# Patient Record
Sex: Female | Born: 1980 | Race: Black or African American | Hispanic: No | Marital: Single | Smoking: Never smoker
Health system: Southern US, Community
[De-identification: ages and names within clinical notes are randomized; demographics above are authoritative.]

## PROBLEM LIST (undated history)

## (undated) ENCOUNTER — Ambulatory Visit: Admission: EM

## (undated) ENCOUNTER — Ambulatory Visit (HOSPITAL_COMMUNITY): Source: Home / Self Care

## (undated) DIAGNOSIS — I82409 Acute embolism and thrombosis of unspecified deep veins of unspecified lower extremity: Secondary | ICD-10-CM

## (undated) DIAGNOSIS — I2699 Other pulmonary embolism without acute cor pulmonale: Secondary | ICD-10-CM

## (undated) DIAGNOSIS — D259 Leiomyoma of uterus, unspecified: Secondary | ICD-10-CM

## (undated) DIAGNOSIS — S2239XA Fracture of one rib, unspecified side, initial encounter for closed fracture: Secondary | ICD-10-CM

## (undated) HISTORY — DX: Acute embolism and thrombosis of unspecified deep veins of unspecified lower extremity: I82.409

## (undated) HISTORY — DX: Leiomyoma of uterus, unspecified: D25.9

---

## 2002-10-24 ENCOUNTER — Emergency Department (HOSPITAL_COMMUNITY): Admission: EM | Admit: 2002-10-24 | Discharge: 2002-10-25 | Payer: Self-pay | Admitting: Emergency Medicine

## 2002-10-25 ENCOUNTER — Encounter: Payer: Self-pay | Admitting: Emergency Medicine

## 2003-03-28 ENCOUNTER — Emergency Department (HOSPITAL_COMMUNITY): Admission: AD | Admit: 2003-03-28 | Discharge: 2003-03-28 | Payer: Self-pay | Admitting: Family Medicine

## 2004-01-15 ENCOUNTER — Emergency Department (HOSPITAL_COMMUNITY): Admission: EM | Admit: 2004-01-15 | Discharge: 2004-01-15 | Payer: Self-pay | Admitting: Family Medicine

## 2004-06-19 ENCOUNTER — Emergency Department (HOSPITAL_COMMUNITY): Admission: EM | Admit: 2004-06-19 | Discharge: 2004-06-19 | Payer: Self-pay | Admitting: Family Medicine

## 2006-06-01 ENCOUNTER — Emergency Department (HOSPITAL_COMMUNITY): Admission: EM | Admit: 2006-06-01 | Discharge: 2006-06-01 | Payer: Self-pay | Admitting: Family Medicine

## 2008-09-21 ENCOUNTER — Emergency Department (HOSPITAL_COMMUNITY): Admission: EM | Admit: 2008-09-21 | Discharge: 2008-09-21 | Payer: Self-pay | Admitting: Family Medicine

## 2009-04-12 ENCOUNTER — Emergency Department (HOSPITAL_COMMUNITY): Admission: EM | Admit: 2009-04-12 | Discharge: 2009-04-12 | Payer: Self-pay | Admitting: Family Medicine

## 2010-01-31 DIAGNOSIS — Z23 Encounter for immunization: Secondary | ICD-10-CM | POA: Insufficient documentation

## 2010-01-31 DIAGNOSIS — R7301 Impaired fasting glucose: Secondary | ICD-10-CM | POA: Insufficient documentation

## 2010-05-10 ENCOUNTER — Encounter
Admission: RE | Admit: 2010-05-10 | Discharge: 2010-05-10 | Payer: Self-pay | Source: Home / Self Care | Attending: Infectious Diseases | Admitting: Infectious Diseases

## 2010-07-22 ENCOUNTER — Inpatient Hospital Stay (INDEPENDENT_AMBULATORY_CARE_PROVIDER_SITE_OTHER)
Admission: RE | Admit: 2010-07-22 | Discharge: 2010-07-22 | Disposition: A | Discharge status: Home / Self Care | Payer: BC Managed Care – PPO | Source: Ambulatory Visit | Attending: Family Medicine | Admitting: Family Medicine

## 2010-07-22 DIAGNOSIS — K5289 Other specified noninfective gastroenteritis and colitis: Secondary | ICD-10-CM

## 2010-07-22 LAB — POCT URINALYSIS DIP (DEVICE)
Bilirubin Urine: NEGATIVE
Glucose, UA: NEGATIVE mg/dL
Hgb urine dipstick: NEGATIVE
Ketones, ur: NEGATIVE mg/dL
Nitrite: NEGATIVE
Protein, ur: NEGATIVE mg/dL
Specific Gravity, Urine: 1.025 (ref 1.005–1.030)
Urobilinogen, UA: 0.2 mg/dL (ref 0.0–1.0)
pH: 6.5 (ref 5.0–8.0)

## 2011-03-11 ENCOUNTER — Emergency Department (HOSPITAL_COMMUNITY)
Admission: EM | Admit: 2011-03-11 | Discharge: 2011-03-11 | Disposition: A | Discharge status: Home / Self Care | Payer: BC Managed Care – PPO | Attending: Emergency Medicine | Admitting: Emergency Medicine

## 2011-03-11 ENCOUNTER — Emergency Department (HOSPITAL_COMMUNITY): Payer: BC Managed Care – PPO

## 2011-03-11 ENCOUNTER — Encounter: Payer: Self-pay | Admitting: *Deleted

## 2011-03-11 DIAGNOSIS — K219 Gastro-esophageal reflux disease without esophagitis: Secondary | ICD-10-CM | POA: Insufficient documentation

## 2011-03-11 DIAGNOSIS — R0789 Other chest pain: Secondary | ICD-10-CM | POA: Insufficient documentation

## 2011-03-11 DIAGNOSIS — Z79899 Other long term (current) drug therapy: Secondary | ICD-10-CM | POA: Insufficient documentation

## 2011-03-11 LAB — DIFFERENTIAL
Basophils Absolute: 0 10*3/uL (ref 0.0–0.1)
Basophils Relative: 0 % (ref 0–1)
Eosinophils Absolute: 0.1 10*3/uL (ref 0.0–0.7)
Eosinophils Relative: 1 % (ref 0–5)
Lymphocytes Relative: 44 % (ref 12–46)
Lymphs Abs: 3 10*3/uL (ref 0.7–4.0)
Monocytes Absolute: 0.3 10*3/uL (ref 0.1–1.0)
Monocytes Relative: 4 % (ref 3–12)
Neutro Abs: 3.5 10*3/uL (ref 1.7–7.7)
Neutrophils Relative %: 50 % (ref 43–77)

## 2011-03-11 LAB — POCT I-STAT, CHEM 8
BUN: 10 mg/dL (ref 6–23)
Calcium, Ion: 1.35 mmol/L — ABNORMAL HIGH (ref 1.12–1.32)
Chloride: 102 mEq/L (ref 96–112)
Creatinine, Ser: 0.9 mg/dL (ref 0.50–1.10)
Glucose, Bld: 85 mg/dL (ref 70–99)
HCT: 38 % (ref 36.0–46.0)
Hemoglobin: 12.9 g/dL (ref 12.0–15.0)
Potassium: 4.1 mEq/L (ref 3.5–5.1)
Sodium: 140 mEq/L (ref 135–145)
TCO2: 27 mmol/L (ref 0–100)

## 2011-03-11 LAB — CARDIAC PANEL(CRET KIN+CKTOT+MB+TROPI)
CK, MB: 3.2 ng/mL (ref 0.3–4.0)
Relative Index: 1.8 (ref 0.0–2.5)
Total CK: 181 U/L — ABNORMAL HIGH (ref 7–177)
Troponin I: <0.3 ng/mL (ref <0.30)

## 2011-03-11 LAB — CBC
HCT: 35.2 % — ABNORMAL LOW (ref 36.0–46.0)
Hemoglobin: 12 g/dL (ref 12.0–15.0)
MCH: 28.8 pg (ref 26.0–34.0)
MCHC: 34.1 g/dL (ref 30.0–36.0)
MCV: 84.6 fL (ref 78.0–100.0)
Platelets: 308 10*3/uL (ref 150–400)
RBC: 4.16 MIL/uL (ref 3.87–5.11)
RDW: 13.9 % (ref 11.5–15.5)
WBC: 6.9 10*3/uL (ref 4.0–10.5)

## 2011-03-11 MED ORDER — PANTOPRAZOLE SODIUM 20 MG PO TBEC: 20.0000 mg | DELAYED_RELEASE_TABLET | Freq: Every day | ORAL | Status: DC

## 2011-03-11 MED ORDER — GI COCKTAIL ~~LOC~~
30.0000 mL | Freq: Once | ORAL | Status: AC
  Administered 2011-03-11: 30 mL via ORAL
  Filled 2011-03-11: qty 30

## 2011-03-11 NOTE — ED Provider Notes (Signed)
History     CSN: [id number] Arrival date & time: 03/11/2011  4:36 PM   First MD Initiated Contact with Patient 03/11/11 2141      Chief Complaint  Patient presents with  . Chest Pain    HPI  History provided by the patient. Patient presents with complaints of substernal chest pressure. Patient reports having episodic symptoms since last Wednesday, 6 days ago. Symptoms are described as a heaviness in the center of her chest that lasts about 30 minutes at a time or until she took over-the-counter medications. Patient will take ibuprofen and Tums with relief of symptoms. Today however ibuprofen and TUMS have not helped with symptoms and they have seemed more persistent during the day. Patient denies any aggravating factors, symptoms are not worse with eating, exertion or activity. Patient denies any associated nausea, vomiting, diaphoresis, shortness of breath, hemoptysis, cough, abdominal pain. Patient works in [employer], commissioning but denies any physical exertion, activity or trauma to the chest to cause injury or symptoms. Patient denies any significant past medical history. Patient has no history of hypertension, diabetes, high cholesterol. Patient is a nonsmoker.   History reviewed. No pertinent past medical history.  History reviewed. No pertinent past surgical history.  History reviewed. No pertinent family history.  History  Substance Use Topics  . Smoking status: Never Smoker   . Smokeless tobacco: Not on file  . Alcohol Use: No    OB History    Grav Para Term Preterm Abortions TAB SAB Ect Mult Living                  Review of Systems  Constitutional: Negative for fever and chills.  Respiratory: Negative for cough and shortness of breath.   Cardiovascular: Positive for chest pain. Negative for palpitations.  Gastrointestinal: Negative for nausea, vomiting, abdominal pain, diarrhea and constipation.  Genitourinary: Negative for dysuria, frequency and hematuria.  Neurological:  Negative for dizziness and light-headedness.  All other systems reviewed and are negative.    Allergies  Review of patient's allergies indicates no known allergies.  Home Medications   Current Outpatient Rx  Name Route Sig Dispense Refill  . CALCIUM CARBONATE ANTACID 500 MG PO CHEW Oral Chew 2 tablets by mouth daily.      . DESONIDE 0.05 % EX CREA Topical Apply 1 application topically 2 (two) times daily.      . IBUPROFEN 200 MG PO TABS Oral Take 600-800 mg by mouth every 6 (six) hours as needed. For pain     . ISONIAZID 100 MG PO TABS Oral Take 100 mg by mouth daily.        BP 129/79  Pulse 67  Temp(Src) 97.7 F (36.5 C) (Oral)  Resp 16  Ht 5\' 5"  (1.651 m)  Wt 247 lb (112.038 kg)  BMI 41.10 kg/m2  SpO2 100%  LMP 03/11/2011  Physical Exam  Nursing note and vitals reviewed. Constitutional: She is oriented to person, place, and time. She appears well-developed and well-nourished. No distress.  HENT:  Head: Normocephalic.  Eyes: Conjunctivae and EOM are normal. Pupils are equal, round, and reactive to light.  Cardiovascular: Normal rate and normal heart sounds.   Pulmonary/Chest: Effort normal and breath sounds normal. No respiratory distress. She has no wheezes. She has no rales. She exhibits no tenderness.  Abdominal: Soft. Bowel sounds are normal. She exhibits no distension and no mass. There is no tenderness. There is no rebound and no guarding.  Musculoskeletal: She exhibits no edema and no tenderness.  Neurological: She is alert and oriented to person, place, and time.  Skin: Skin is warm and dry.  Psychiatric: She has a normal mood and affect. Her behavior is normal.    ED Course  Procedures (including critical care time)  Labs Reviewed  CBC - Abnormal; Notable for the following:    HCT 35.2 (*)    All other components within normal limits  CARDIAC PANEL(CRET KIN+CKTOT+MB+TROPI) - Abnormal; Notable for the following:    Total CK 181 (*)    All other components  within normal limits  POCT I-STAT, CHEM 8 - Abnormal; Notable for the following:    Calcium, Ion 1.35 (*)    All other components within normal limits  DIFFERENTIAL  I-STAT, CHEM 8   Results for orders placed during the hospital encounter of 03/11/11  CBC      Component Value Range   WBC 6.9  4.0 - 10.5 (K/uL)   RBC 4.16  3.87 - 5.11 (MIL/uL)   Hemoglobin 12.0  12.0 - 15.0 (g/dL)   HCT 16.1 (*) 09.6 - 46.0 (%)   MCV 84.6  78.0 - 100.0 (fL)   MCH 28.8  26.0 - 34.0 (pg)   MCHC 34.1  30.0 - 36.0 (g/dL)   RDW 04.5  40.9 - 81.1 (%)   Platelets 308  150 - 400 (K/uL)  DIFFERENTIAL      Component Value Range   Neutrophils Relative 50  43 - 77 (%)   Neutro Abs 3.5  1.7 - 7.7 (K/uL)   Lymphocytes Relative 44  12 - 46 (%)   Lymphs Abs 3.0  0.7 - 4.0 (K/uL)   Monocytes Relative 4  3 - 12 (%)   Monocytes Absolute 0.3  0.1 - 1.0 (K/uL)   Eosinophils Relative 1  0 - 5 (%)   Eosinophils Absolute 0.1  0.0 - 0.7 (K/uL)   Basophils Relative 0  0 - 1 (%)   Basophils Absolute 0.0  0.0 - 0.1 (K/uL)  CARDIAC PANEL(CRET KIN+CKTOT+MB+TROPI)      Component Value Range   Total CK 181 (*) 7 - 177 (U/L)   CK, MB 3.2  0.3 - 4.0 (ng/mL)   Troponin I <0.30  <0.30 (ng/mL)   Relative Index 1.8  0.0 - 2.5   POCT I-STAT, CHEM 8      Component Value Range   Sodium 140  135 - 145 (mEq/L)   Potassium 4.1  3.5 - 5.1 (mEq/L)   Chloride 102  96 - 112 (mEq/L)   BUN 10  6 - 23 (mg/dL)   Creatinine, Ser 9.14  0.50 - 1.10 (mg/dL)   Glucose, Bld 85  70 - 99 (mg/dL)   Calcium, Ion 7.82 (*) 1.12 - 1.32 (mmol/L)   TCO2 27  0 - 100 (mmol/L)   Hemoglobin 12.9  12.0 - 15.0 (g/dL)   HCT 95.6  21.3 - 08.6 (%)     Dg Chest 2 View  03/11/2011  *RADIOLOGY REPORT*  Clinical Data: Chest pain, burning feeling  CHEST - 2 VIEW  Comparison: 05/10/2010  Findings: Normal heart size, mediastinal contours, and pulmonary vascularity. Minimal chronic peribronchial thickening. No pulmonary infiltrate, pleural effusion or  pneumothorax. Bones unremarkable.  IMPRESSION: No acute abnormalities.  Original Report Authenticated By: Lollie Marrow, M.D.     1. Chest discomfort   2. GERD (gastroesophageal reflux disease)       MDM  10:00 PM patient seen and evaluated. Patient in no acute distress.  Patient with normal  lab tests, chest x-ray, ECG. Patient with no significant risk factors for ACS. Patient is PERC negative. At this time feel patient is able to return home and followup with primary care provider.   Date: 03/11/2011 4:30 PM  Rate: 66  Rhythm: normal sinus rhythm  QRS Axis: normal  Intervals: normal  ST/T Wave abnormalities: normal  Conduction Disutrbances:none  Narrative Interpretation:   Old EKG Reviewed: none available       Angus Seller, [place] 03/11/11 2216

## 2011-03-11 NOTE — ED Notes (Signed)
Pt c/o epigastric pain with nausea for one week.  There is no pattern to the pain.  No distress skin warm and dry .  She has noit eaten today

## 2011-03-11 NOTE — ED Provider Notes (Signed)
Medical screening examination/treatment/procedure(s) were performed by non-physician practitioner and as supervising physician I was immediately available for consultation/collaboration.   Raynor Calcaterra, MD 03/11/11 2307 

## 2011-03-11 NOTE — Discharge Instructions (Signed)
You were evaluated today for chest discomfort. At this time your lab tests, EKG, and chest x-ray have not shown any signs for concerning or emergent cause of your symptoms. Your providers today feel that your symptoms are possibly caused from indigestion. You have been given a prescription for an antacid medication to take once a day to see if your symptoms improve. Please call your primary care provider this week to schedule a followup appointment and to be sure your symptoms are improving. He develop worsening symptoms, new symptoms such as shortness of breath, cough, fever, chills, persistent nausea vomiting please return to the emergency room immediately.  Gastroesophageal Reflux Disease, Adult Gastroesophageal reflux disease (GERD) happens when acid from your stomach flows up into the esophagus. When acid comes in contact with the esophagus, the acid causes soreness (inflammation) in the esophagus. Over time, GERD may create small holes (ulcers) in the lining of the esophagus. CAUSES   Increased body weight. This puts pressure on the stomach, making acid rise from the stomach into the esophagus.   Smoking. This increases acid production in the stomach.   Drinking alcohol. This causes decreased pressure in the lower esophageal sphincter (valve or ring of muscle between the esophagus and stomach), allowing acid from the stomach into the esophagus.   Late evening meals and a full stomach. This increases pressure and acid production in the stomach.   A malformed lower esophageal sphincter.  Sometimes, no cause is found. SYMPTOMS   Burning pain in the lower part of the mid-chest behind the breastbone and in the mid-stomach area. This may occur twice a week or more often.   Trouble swallowing.   Sore throat.   Dry cough.   Asthma-like symptoms including chest tightness, shortness of breath, or wheezing.  DIAGNOSIS  Your caregiver may be able to diagnose GERD based on your symptoms. In some  cases, X-rays and other tests may be done to check for complications or to check the condition of your stomach and esophagus. TREATMENT  Your caregiver may recommend over-the-counter or prescription medicines to help decrease acid production. Ask your caregiver before starting or adding any new medicines.  HOME CARE INSTRUCTIONS   Change the factors that you can control. Ask your caregiver for guidance concerning weight loss, quitting smoking, and alcohol consumption.   Avoid foods and drinks that make your symptoms worse, such as:   Caffeine or alcoholic drinks.   Chocolate.   Peppermint or mint flavorings.   Garlic and onions.   Spicy foods.   Citrus fruits, such as oranges, lemons, or limes.   Tomato-based foods such as sauce, chili, salsa, and pizza.   Fried and fatty foods.   Avoid lying down for the 3 hours prior to your bedtime or prior to taking a nap.   Eat small, frequent meals instead of large meals.   Wear loose-fitting clothing. Do not wear anything tight around your waist that causes pressure on your stomach.   Raise the head of your bed 6 to 8 inches with wood blocks to help you sleep. Extra pillows will not help.   Only take over-the-counter or prescription medicines for pain, discomfort, or fever as directed by your caregiver.   Do not take aspirin, ibuprofen, or other nonsteroidal anti-inflammatory drugs (NSAIDs).  SEEK IMMEDIATE MEDICAL CARE IF:   You have pain in your arms, neck, jaw, teeth, or back.   Your pain increases or changes in intensity or duration.   You develop nausea, vomiting, or sweating (  diaphoresis).   You develop shortness of breath, or you faint.   Your vomit is green, yellow, black, or looks like coffee grounds or blood.   Your stool is red, bloody, or black.  These symptoms could be signs of other problems, such as heart disease, gastric bleeding, or esophageal bleeding. MAKE SURE YOU:   Understand these instructions.    Will watch your condition.   Will get help right away if you are not doing well or get worse.  Document Released: 01/08/2005 Document Revised: 12/11/2010 Document Reviewed: 10/18/2010 [facility] Patient Information 2012 [place], [place].   Chest Pain (Nonspecific) It is often hard to give a specific diagnosis for the cause of chest pain. There is always a chance that your pain could be related to something serious, such as a heart attack or a blood clot in the lungs. You need to follow up with your caregiver for further evaluation. CAUSES   Heartburn.   Pneumonia or bronchitis.   Anxiety and stress.   Inflammation around your heart (pericarditis) or lung (pleuritis or pleurisy).   A blood clot in the lung.   A collapsed lung (pneumothorax). It can develop suddenly on its own (spontaneous pneumothorax) or from injury (trauma) to the chest.  The chest wall is composed of bones, muscles, and cartilage. Any of these can be the source of the pain.  The bones can be bruised by injury.   The muscles or cartilage can be strained by coughing or overwork.   The cartilage can be affected by inflammation and become sore (costochondritis).  DIAGNOSIS  Lab tests or other studies, such as X-rays, an EKG, stress testing, or cardiac imaging, may be needed to find the cause of your pain.  TREATMENT   Treatment depends on what may be causing your chest pain. Treatment may include:   Acid blockers for heartburn.   Anti-inflammatory medicine.   Pain medicine for inflammatory conditions.   Antibiotics if an infection is present.   You may be advised to change lifestyle habits. This includes stopping smoking and avoiding caffeine and chocolate.   You may be advised to keep your head raised (elevated) when sleeping. This reduces the chance of acid going backward from your stomach into your esophagus.   Most of the time, nonspecific chest pain will improve within 2 to 3 days with rest and mild  pain medicine.  HOME CARE INSTRUCTIONS   If antibiotics were prescribed, take the full amount even if you start to feel better.   For the next few days, avoid physical activities that bring on chest pain. Continue physical activities as directed.   Do not smoke cigarettes or drink alcohol until your symptoms are gone.   Only take over-the-counter or prescription medicine for pain, discomfort, or fever as directed by your caregiver.   Follow your caregiver's suggestions for further testing if your chest pain does not go away.   Keep any follow-up appointments you made. If you do not go to an appointment, you could develop lasting (chronic) problems with pain. If there is any problem keeping an appointment, you must call to reschedule.  SEEK MEDICAL CARE IF:   You think you are having problems from the medicine you are taking. Read your medicine instructions carefully.   Your chest pain does not go away, even after treatment.   You develop a rash with blisters on your chest.  SEEK IMMEDIATE MEDICAL CARE IF:   You have increased chest pain or pain that spreads to  your arm, neck, jaw, back, or belly (abdomen).   You develop shortness of breath, an increasing cough, or you are coughing up blood.   You have severe back or abdominal pain, feel sick to your stomach (nauseous) or throw up (vomit).   You develop severe weakness, fainting, or chills.   You have an oral temperature above 102 F (38.9 C), not controlled by medicine.  THIS IS AN EMERGENCY. Do not wait to see if the pain will go away. Get medical help at once. Call your local emergency services (911 in [place] Do not drive yourself to the hospital. MAKE SURE YOU:   Understand these instructions.   Will watch your condition.   Will get help right away if you are not doing well or get worse.  Document Released: 01/08/2005 Document Revised: 12/11/2010 Document Reviewed: 11/04/2007 [facility] Patient Information 2012 [place],  [place].

## 2012-04-03 ENCOUNTER — Emergency Department (HOSPITAL_COMMUNITY)
Admission: EM | Admit: 2012-04-03 | Discharge: 2012-04-03 | Disposition: A | Discharge status: Home / Self Care | Payer: BC Managed Care – PPO | Source: Home / Self Care | Attending: Emergency Medicine | Admitting: Emergency Medicine

## 2012-04-03 ENCOUNTER — Encounter (HOSPITAL_COMMUNITY): Payer: Self-pay | Admitting: *Deleted

## 2012-04-03 DIAGNOSIS — J069 Acute upper respiratory infection, unspecified: Secondary | ICD-10-CM

## 2012-04-03 MED ORDER — BENZONATATE 200 MG PO CAPS: 200.0000 mg | ORAL_CAPSULE | Freq: Three times a day (TID) | ORAL | Status: DC | PRN

## 2012-04-03 MED ORDER — FEXOFENADINE-PSEUDOEPHED ER 60-120 MG PO TB12: 1.0000 | ORAL_TABLET | Freq: Two times a day (BID) | ORAL | Status: DC

## 2012-04-03 MED ORDER — NAPROXEN 500 MG PO TABS: 500.0000 mg | ORAL_TABLET | Freq: Two times a day (BID) | ORAL | Status: DC

## 2012-04-03 NOTE — ED Notes (Signed)
Patient complains of head and chest congestion with sore throat x 5 days. Patient states cough has been intermittent in this period. Patient also complains of right ear pain. Denies nausea, vomiting, fever/chills, diarrhea.

## 2012-04-03 NOTE — ED Provider Notes (Signed)
Chief Complaint  Patient presents with  . URI    History of Present Illness:   Felicia Pham is a 31 year old female who has had a five-day history of sore throat, nasal congestion with clear to yellow drainage, headache, sinus pressure, right ear pain, dry cough, aching in her chest. She denies any fever, chills, or GI symptoms. She has not been exposed to anything in particular has not taken any medications for symptom relief.  Review of Systems:  Other than noted above, the patient denies any of the following symptoms. Systemic:  No fever, chills, sweats, fatigue, myalgias, headache, or anorexia. Eye:  No redness, pain or drainage. ENT:  No earache, ear congestion, nasal congestion, sneezing, rhinorrhea, sinus pressure, sinus pain, post nasal drip, or sore throat. Lungs:  No cough, sputum production, wheezing, shortness of breath, or chest pain. GI:  No abdominal pain, nausea, vomiting, or diarrhea.  PMFSH:  Past medical history, family history, social history, meds, and allergies were reviewed.  Physical Exam:   Vital signs:  BP 130/81  Pulse 70  Temp 99.2 F (37.3 C) (Oral)  Resp 18  SpO2 100%  LMP 03/13/2012 General:  Alert, in no distress. Eye:  No conjunctival injection or drainage. Lids were normal. ENT:  TMs and canals were normal, without erythema or inflammation.  Nasal mucosa was clear and uncongested, without drainage.  Mucous membranes were moist.  Pharynx was clear, without exudate or drainage.  There were no oral ulcerations or lesions. Neck:  Supple, no adenopathy, tenderness or mass. Lungs:  No respiratory distress.  Lungs were clear to auscultation, without wheezes, rales or rhonchi.  Breath sounds were clear and equal bilaterally.  Heart:  Regular rhythm, without gallops, murmers or rubs. Skin:  Clear, warm, and dry, without rash or lesions.  Assessment:  The encounter diagnosis was Viral upper respiratory infection.  Plan:   1.  The following meds were prescribed:    New Prescriptions   BENZONATATE (TESSALON) 200 MG CAPSULE    Take 1 capsule (200 mg total) by mouth 3 (three) times daily as needed for cough.   FEXOFENADINE-PSEUDOEPHEDRINE (ALLEGRA-D) 60-120 MG PER TABLET    Take 1 tablet by mouth every 12 (twelve) hours.   NAPROXEN (NAPROSYN) 500 MG TABLET    Take 1 tablet (500 mg total) by mouth 2 (two) times daily.   2.  The patient was instructed in symptomatic care and handouts were given. 3.  The patient was told to return if becoming worse in any way, if no better in 3 or 4 days, and given some red flag symptoms that would indicate earlier return.   Reuben Likes, MD 04/03/12 [phone]

## 2012-04-06 ENCOUNTER — Emergency Department (HOSPITAL_COMMUNITY): Payer: BC Managed Care – PPO

## 2012-04-06 ENCOUNTER — Emergency Department (HOSPITAL_COMMUNITY)
Admission: EM | Admit: 2012-04-06 | Discharge: 2012-04-06 | Disposition: A | Discharge status: Home / Self Care | Payer: BC Managed Care – PPO | Attending: Emergency Medicine | Admitting: Emergency Medicine

## 2012-04-06 ENCOUNTER — Encounter (HOSPITAL_COMMUNITY): Payer: Self-pay | Admitting: Emergency Medicine

## 2012-04-06 DIAGNOSIS — R059 Cough, unspecified: Secondary | ICD-10-CM | POA: Insufficient documentation

## 2012-04-06 DIAGNOSIS — J209 Acute bronchitis, unspecified: Secondary | ICD-10-CM | POA: Insufficient documentation

## 2012-04-06 DIAGNOSIS — J069 Acute upper respiratory infection, unspecified: Secondary | ICD-10-CM

## 2012-04-06 DIAGNOSIS — J40 Bronchitis, not specified as acute or chronic: Secondary | ICD-10-CM

## 2012-04-06 DIAGNOSIS — R05 Cough: Secondary | ICD-10-CM | POA: Insufficient documentation

## 2012-04-06 LAB — POCT RAPID STREP A: Streptococcus, Group A Screen (Direct): NEGATIVE

## 2012-04-06 MED ORDER — AZITHROMYCIN 250 MG PO TABS: 250.0000 mg | ORAL_TABLET | Freq: Every day | ORAL | Status: DC

## 2012-04-06 NOTE — ED Notes (Signed)
Pt c/o URI and cough x 1 week; pt sts seen at [facility] for same and not feeling better; pt sts productive cough and chest congestion

## 2012-04-06 NOTE — ED Provider Notes (Addendum)
History   This chart was scribed for Felicia Sprout, MD by Felicia Pham, ED Scribe. The patient was seen in room TR06C/TR06C and the patient's care was started at 2:49PM.     CSN: [id number]  Arrival date & time 04/06/12  1430   First MD Initiated Contact with Patient 04/06/12 1449      Chief Complaint  Patient presents with  . URI  . Cough    (Consider location/radiation/quality/duration/timing/severity/associated sxs/prior treatment) Patient is a 31 y.o. female presenting with URI and cough. The history is provided by the patient. No language interpreter was used.  URI The primary symptoms include cough. The current episode started 6 to 7 days ago. This is a new problem. The problem has been gradually worsening.  The cough began 6 to 7 days ago. The cough is new. The cough is productive.  Symptoms associated with the illness include congestion.  Cough This is a new problem. The current episode started more than 2 days ago. The problem occurs every few minutes. The problem has been gradually worsening. The cough is productive of sputum. There has been no fever. She has tried nothing for the symptoms. The treatment provided no relief.    Felicia Pham is a 31 y.o. female , who presents to the Emergency Department complaining of  sudden, progressively(worsening, productive cough, onset one week ago.  Associated symptoms include chest congestion. The pt has taken Delsym, however, it does not provide relief of the cough.  The pt denies fever and wheezing. In addition, the pt denies any hx of asthma and any other medical problems.   The pt does not smoke or drink alcohol. The pt's LNMP was 03/13/12.     History reviewed. No pertinent past medical history.  History reviewed. No pertinent past surgical history.  History reviewed. No pertinent family history.  History  Substance Use Topics  . Smoking status: Never Smoker   . Smokeless tobacco: Not on file  . Alcohol Use: No     OB History    Grav Para Term Preterm Abortions TAB SAB Ect Mult Living                  Review of Systems  HENT: Positive for congestion.   Respiratory: Positive for cough.   All other systems reviewed and are negative.    Allergies  Review of patient's allergies indicates no known allergies.  Home Medications   Current Outpatient Rx  Name  Route  Sig  Dispense  Refill  . BENZONATATE 200 MG PO CAPS   Oral   Take 200 mg by mouth 3 (three) times daily as needed. For cough         . DEXTROMETHORPHAN POLISTIREX ER 30 MG/5ML PO LQCR   Oral   Take 60 mg by mouth as needed. For cough         . IBUPROFEN 200 MG PO TABS   Oral   Take 600-800 mg by mouth every 6 (six) hours as needed. For pain          . NAPROXEN 500 MG PO TABS   Oral   Take 1 tablet (500 mg total) by mouth 2 (two) times daily.   30 tablet   0   . NORETHINDRONE ACET-ETHINYL EST 1-20 MG-MCG PO TABS   Oral   Take 1 tablet by mouth daily.           BP 142/86  Pulse 79  Temp 97.9 F (36.6 C)  Resp 16  SpO2 100%  LMP 03/13/2012  Physical Exam  Nursing note and vitals reviewed. Constitutional: She is oriented to person, place, and time. She appears well-developed and well-nourished.  HENT:  Right Ear: Tympanic membrane and external ear normal.  Left Ear: Tympanic membrane and external ear normal.  Nose: Nose normal.  Mouth/Throat: Posterior oropharyngeal erythema present. No oropharyngeal exudate or posterior oropharyngeal edema.  Eyes: Conjunctivae normal and EOM are normal. Pupils are equal, round, and reactive to light.  Neck: Normal range of motion.  Cardiovascular: Normal rate, regular rhythm and normal heart sounds.   Pulmonary/Chest: Effort normal and breath sounds normal.  Abdominal: Soft. Bowel sounds are normal.  Musculoskeletal: Normal range of motion.  Neurological: She is alert and oriented to person, place, and time.  Skin: Skin is warm and dry.  Psychiatric: She has a  normal mood and affect. Her behavior is normal.    ED Course  Procedures (including critical care time)  DIAGNOSTIC STUDIES: Oxygen Saturation is 100% on room air, normal by my interpretation.    COORDINATION OF CARE:  2:52 PM- Treatment plan discussed with patient. Pt agrees with treatment.      Labs Reviewed - No data to display Dg Chest 2 View  04/06/2012  *RADIOLOGY REPORT*  Clinical Data: Cough and congestion  CHEST - 2 VIEW  Comparison: March 11, 2011  Findings: Lungs clear.  Heart size and pulmonary vascularity are normal.  No adenopathy.  No bone lesions.  IMPRESSION: No abnormality noted.   Original Report Authenticated By: Bretta Bang, M.D.      No diagnosis found.    MDM   Pt with symptoms consistent with viral URI.  Well appearing here.  No signs of breathing difficulty  No signs of pharyngitis, otitis or abnormal abdominal findings.  Pt seen 3 days ago and sx are worsening and no complaining of congestion in her chest. CXR wnl and pt to return with any further problems.       I personally performed the services described in this documentation, which was scribed in my presence.  The recorded information has been reviewed and considered.    Felicia Sprout, MD 04/06/12 1507  Felicia Sprout, MD 04/06/12 [phone]

## 2012-04-14 HISTORY — PX: MYOMECTOMY: SHX85

## 2012-04-27 ENCOUNTER — Emergency Department (HOSPITAL_COMMUNITY)
Admission: EM | Admit: 2012-04-27 | Discharge: 2012-04-27 | Disposition: A | Discharge status: Home / Self Care | Payer: BC Managed Care – PPO | Source: Home / Self Care | Attending: Family Medicine | Admitting: Family Medicine

## 2012-04-27 ENCOUNTER — Encounter (HOSPITAL_COMMUNITY): Payer: Self-pay | Admitting: *Deleted

## 2012-04-27 ENCOUNTER — Emergency Department (INDEPENDENT_AMBULATORY_CARE_PROVIDER_SITE_OTHER): Payer: BC Managed Care – PPO

## 2012-04-27 DIAGNOSIS — J069 Acute upper respiratory infection, unspecified: Secondary | ICD-10-CM

## 2012-04-27 MED ORDER — IPRATROPIUM BROMIDE 0.06 % NA SOLN: 2.0000 | Freq: Four times a day (QID) | NASAL | Status: DC

## 2012-04-27 NOTE — ED Notes (Signed)
Pt reports bronchitis and upper respiratory sym. Since before christmas - nasal congestion , throat pain, head ache, ear pain, chest congestion - is currently on avelox after previously finishing zpack

## 2012-04-27 NOTE — ED Provider Notes (Signed)
History     CSN: [id number]  Arrival date & time 04/27/12  1250   First MD Initiated Contact with Patient 04/27/12 1323      Chief Complaint  Patient presents with  . URI    (Consider location/radiation/quality/duration/timing/severity/associated sxs/prior treatment) Patient is a 32 y.o. female presenting with URI. The history is provided by the patient.  URI The primary symptoms include fever and cough. Primary symptoms do not include sore throat, wheezing, nausea, vomiting, myalgias or rash. The current episode started more than 1 week ago. This is a recurrent problem. The problem has been gradually worsening (sx since 12/21, seen in ER 12/24 given z-pack, then last week by lmd and started on avelox, but sx continue.).  Symptoms associated with the illness include congestion and rhinorrhea. The illness is not associated with sinus pressure.    History reviewed. No pertinent past medical history.  History reviewed. No pertinent past surgical history.  Family History  Problem Relation Age of Onset  . Family history unknown: Yes    History  Substance Use Topics  . Smoking status: Never Smoker   . Smokeless tobacco: Not on file  . Alcohol Use: No    OB History    Grav Para Term Preterm Abortions TAB SAB Ect Mult Living                  Review of Systems  Constitutional: Positive for fever.  HENT: Positive for congestion, rhinorrhea and postnasal drip. Negative for sore throat and sinus pressure.   Respiratory: Positive for cough. Negative for shortness of breath and wheezing.   Gastrointestinal: Negative for nausea, vomiting and diarrhea.  Genitourinary: Negative.   Musculoskeletal: Negative for myalgias.  Skin: Negative for rash.    Allergies  Review of patient's allergies indicates no known allergies.  Home Medications   Current Outpatient Rx  Name  Route  Sig  Dispense  Refill  . AZITHROMYCIN 250 MG PO TABS   Oral   Take 1 tablet (250 mg total) by mouth  daily. Take first 2 tablets together, then 1 every day until finished.   6 tablet   0   . BENZONATATE 200 MG PO CAPS   Oral   Take 200 mg by mouth 3 (three) times daily as needed. For cough         . DEXTROMETHORPHAN POLISTIREX ER 30 MG/5ML PO LQCR   Oral   Take 60 mg by mouth as needed. For cough         . IBUPROFEN 200 MG PO TABS   Oral   Take 600-800 mg by mouth every 6 (six) hours as needed. For pain          . IPRATROPIUM BROMIDE 0.06 % NA SOLN   Nasal   Place 2 sprays into the nose 4 (four) times daily.   15 mL   1   . NAPROXEN 500 MG PO TABS   Oral   Take 1 tablet (500 mg total) by mouth 2 (two) times daily.   30 tablet   0   . NORETHINDRONE ACET-ETHINYL EST 1-20 MG-MCG PO TABS   Oral   Take 1 tablet by mouth daily.           BP 134/65  Pulse 96  Temp 98.1 F (36.7 C) (Oral)  Resp 16  SpO2 100%  LMP 03/28/2012  Physical Exam  Nursing note and vitals reviewed. Constitutional: She is oriented to person, place, and time. She appears well-developed  and well-nourished.  HENT:  Head: Normocephalic.  Right Ear: External ear normal.  Left Ear: External ear normal.  Nose: Mucosal edema and rhinorrhea present.  Mouth/Throat: Oropharynx is clear and moist.  Eyes: Conjunctivae normal are normal. Pupils are equal, round, and reactive to light.  Neck: Normal range of motion. Neck supple.  Cardiovascular: Normal rate, regular rhythm, normal heart sounds and intact distal pulses.   Pulmonary/Chest: Effort normal and breath sounds normal.  Abdominal: Soft. Bowel sounds are normal.  Lymphadenopathy:    She has no cervical adenopathy.  Neurological: She is alert and oriented to person, place, and time.  Skin: Skin is warm and dry.    ED Course  Procedures (including critical care time)  Labs Reviewed - No data to display Dg Sinuses Complete  04/27/2012  *RADIOLOGY REPORT*  Clinical Data: Cough, congestion.  PARANASAL SINUSES - COMPLETE 3 + VIEW   Comparison: None.  Findings: Paranasal sinuses are clear.  No air fluid levels.  No visible mucoperiosteal thickening.  Bony structures unremarkable.  IMPRESSION: No evidence of sinusitis.   Original Report Authenticated By: Charlett Nose, M.D.      1. URI (upper respiratory infection)       MDM  X-rays reviewed and report per radiologist.         Linna Hoff, MD 04/27/12 1538

## 2012-06-30 ENCOUNTER — Other Ambulatory Visit: Payer: Self-pay | Admitting: Obstetrics & Gynecology

## 2012-06-30 DIAGNOSIS — N949 Unspecified condition associated with female genital organs and menstrual cycle: Secondary | ICD-10-CM

## 2012-07-14 ENCOUNTER — Ambulatory Visit (INDEPENDENT_AMBULATORY_CARE_PROVIDER_SITE_OTHER): Payer: BC Managed Care – HMO

## 2012-07-14 DIAGNOSIS — IMO0002 Reserved for concepts with insufficient information to code with codable children: Secondary | ICD-10-CM

## 2012-07-14 DIAGNOSIS — N949 Unspecified condition associated with female genital organs and menstrual cycle: Secondary | ICD-10-CM

## 2012-07-14 DIAGNOSIS — N852 Hypertrophy of uterus: Secondary | ICD-10-CM

## 2012-07-22 ENCOUNTER — Encounter: Payer: Self-pay | Admitting: Obstetrics & Gynecology

## 2012-08-02 ENCOUNTER — Ambulatory Visit: Payer: Self-pay | Admitting: Obstetrics & Gynecology

## 2012-08-02 ENCOUNTER — Ambulatory Visit: Payer: BC Managed Care – HMO | Admitting: Obstetrics & Gynecology

## 2012-08-04 ENCOUNTER — Ambulatory Visit (INDEPENDENT_AMBULATORY_CARE_PROVIDER_SITE_OTHER): Payer: BC Managed Care – HMO | Admitting: Obstetrics & Gynecology

## 2012-08-04 ENCOUNTER — Encounter: Payer: Self-pay | Admitting: Obstetrics & Gynecology

## 2012-08-04 VITALS — BP 117/78 | HR 93 | Temp 98.3°F | Ht 65.0 in | Wt 222.4 lb

## 2012-08-04 DIAGNOSIS — D259 Leiomyoma of uterus, unspecified: Secondary | ICD-10-CM

## 2012-08-04 NOTE — Patient Instructions (Signed)
Uterine Fibroid  A uterine fibroid is a growth (tumor) that occurs in a woman's uterus. This type of tumor is not cancerous and does not spread out of the uterus. A woman can have one or many fibroids, and the fiboid(s) can become quite large. A fibroid can vary in size, weight, and where it grows in the uterus. Most fibroids do not require medical treatment, but some can cause pain or heavy bleeding during and between periods.  CAUSES   A fibroid is the result of a single uterine cell that keeps growing (unregulated), which is different than most cells in the human body. Most cells have a control mechanism that keeps them from reproducing without control.   SYMPTOMS    Bleeding.   Pelvic pain and pressure.   Bladder problems due to the size of the fibroid.   Infertility and miscarriages depending on the size and location of the fibroid.  DIAGNOSIS   A diagnosis is made by physical exam. Your caregiver may feel the lumpy tumors during a pelvic exam. Important information regarding size, location, and number of tumors can be gained by having an ultrasound. It is rare that other tests, such as a CT scan or MRI, are needed.  TREATMENT    Your caregiver may recommend watchful waiting. This involves getting the fibroid checked by your caregiver to see if the fibroids grow or shrink.    Hormonal treatment or an intrauterine device (IUD) may be prescribed.    Surgery may be needed to remove the fibroids (myomectomy) or the uterus (hysterectomy). This depends on your situation.  When fibroids interfere with fertility and a woman wants to become pregnant, a caregiver may recommend having the fibroids removed.   HOME CARE INSTRUCTIONS   Home care depends on how you were treated. In general:    Keep all follow-up appointments with your caregiver.    Only take medicine as told by your caregiver. Do not take aspirin. It can cause bleeding.    If you have excessive periods and soak tampons or pads in a half hour or  less, contact your caregiver immediately. If your periods are troublesome but not so heavy, lie down with your feet raised slightly above your heart. Place cold packs on your lower abdomen.    If your periods are heavy, write down the number of pads or tampons you use per month. Bring this information to your caregiver.    Talk to your caregiver about taking iron pills.    Include green vegetables in your diet.    If you were prescribed a hormonal treatment, take the hormonal medicines as directed.    If you need surgery, ask your caregiver for information on your specific surgery.   SEEK IMMEDIATE MEDICAL CARE IF:   You have pelvic pain or cramps not controlled with medicines.    You have a sudden increase in pelvic pain.    You have an increase of bleeding between and during periods.    You feel lightheaded or have fainting episodes.   MAKE SURE YOU:   Understand these instructions.   Will watch your condition.   Will get help right away if you are not doing well or get worse.  Document Released: 03/28/2000 Document Revised: 06/23/2011 Document Reviewed: 04/21/2011  [employer] Patient Information 2013 [employer].

## 2012-08-04 NOTE — Progress Notes (Signed)
.   Subjective:     HALAYNA BLANE is a 32 y.o. female here for her ultrasound results - done 07/14/12.  No current complaints.  Personal health questionnaire reviewed: no.   Gynecologic History Patient's last menstrual period was 07/25/2012. Last Pap:11/2011. Results were: normal Last mammogram: N/A  Obstetric History OB History   Grav Para Term Preterm Abortions TAB SAB Ect Mult Living                   The following portions of the patient's history were reviewed and updated as appropriate: allergies, current medications, past family history, past medical history, past social history, past surgical history and problem list.  Review of Systems Pertinent items are noted in HPI.    Objective:     No exam today     Assessment:    Moderately enlarged fibroid uterus, minimal symptoms  Plan:   Recommend myomectomies prior to pregnancy Return prn

## 2012-08-05 ENCOUNTER — Encounter: Payer: Self-pay | Admitting: Obstetrics & Gynecology

## 2012-08-05 DIAGNOSIS — D259 Leiomyoma of uterus, unspecified: Secondary | ICD-10-CM | POA: Insufficient documentation

## 2012-10-29 ENCOUNTER — Emergency Department (HOSPITAL_COMMUNITY)
Admission: EM | Admit: 2012-10-29 | Discharge: 2012-10-29 | Disposition: A | Discharge status: Home / Self Care | Payer: BC Managed Care – PPO | Attending: Emergency Medicine | Admitting: Emergency Medicine

## 2012-10-29 ENCOUNTER — Emergency Department (HOSPITAL_COMMUNITY): Payer: BC Managed Care – PPO

## 2012-10-29 ENCOUNTER — Encounter (HOSPITAL_COMMUNITY): Payer: Self-pay | Admitting: Emergency Medicine

## 2012-10-29 DIAGNOSIS — R1032 Left lower quadrant pain: Secondary | ICD-10-CM

## 2012-10-29 DIAGNOSIS — N949 Unspecified condition associated with female genital organs and menstrual cycle: Secondary | ICD-10-CM | POA: Insufficient documentation

## 2012-10-29 DIAGNOSIS — Z79899 Other long term (current) drug therapy: Secondary | ICD-10-CM | POA: Insufficient documentation

## 2012-10-29 DIAGNOSIS — R110 Nausea: Secondary | ICD-10-CM | POA: Insufficient documentation

## 2012-10-29 DIAGNOSIS — Z791 Long term (current) use of non-steroidal anti-inflammatories (NSAID): Secondary | ICD-10-CM | POA: Insufficient documentation

## 2012-10-29 DIAGNOSIS — Z3202 Encounter for pregnancy test, result negative: Secondary | ICD-10-CM | POA: Insufficient documentation

## 2012-10-29 DIAGNOSIS — D259 Leiomyoma of uterus, unspecified: Secondary | ICD-10-CM | POA: Insufficient documentation

## 2012-10-29 LAB — URINALYSIS, ROUTINE W REFLEX MICROSCOPIC
Bilirubin Urine: NEGATIVE
Glucose, UA: NEGATIVE mg/dL
Hgb urine dipstick: NEGATIVE
Ketones, ur: NEGATIVE mg/dL
Leukocytes, UA: NEGATIVE
Nitrite: NEGATIVE
Protein, ur: NEGATIVE mg/dL
Specific Gravity, Urine: 1.015 (ref 1.005–1.030)
Urobilinogen, UA: 0.2 mg/dL (ref 0.0–1.0)
pH: 6 (ref 5.0–8.0)

## 2012-10-29 LAB — WET PREP, GENITAL
Clue Cells Wet Prep HPF POC: NONE SEEN
Trich, Wet Prep: NONE SEEN
WBC, Wet Prep HPF POC: NONE SEEN
Yeast Wet Prep HPF POC: NONE SEEN

## 2012-10-29 LAB — POCT PREGNANCY, URINE: Preg Test, Ur: NEGATIVE

## 2012-10-29 MED ORDER — IBUPROFEN 800 MG PO TABS: 800.0000 mg | ORAL_TABLET | Freq: Three times a day (TID) | ORAL | Status: DC

## 2012-10-29 MED ORDER — MORPHINE SULFATE 4 MG/ML IJ SOLN
6.0000 mg | Freq: Once | INTRAMUSCULAR | Status: AC
  Administered 2012-10-29: 6 mg via INTRAMUSCULAR
  Filled 2012-10-29: qty 2

## 2012-10-29 MED ORDER — OXYCODONE-ACETAMINOPHEN 5-325 MG PO TABS
2.0000 | ORAL_TABLET | Freq: Once | ORAL | Status: AC
  Administered 2012-10-29: 2 via ORAL
  Filled 2012-10-29: qty 2

## 2012-10-29 MED ORDER — HYDROCODONE-ACETAMINOPHEN 5-325 MG PO TABS: 2.0000 | ORAL_TABLET | ORAL | Status: DC | PRN

## 2012-10-29 MED ORDER — IBUPROFEN 800 MG PO TABS
800.0000 mg | ORAL_TABLET | Freq: Once | ORAL | Status: AC
  Administered 2012-10-29: 800 mg via ORAL
  Filled 2012-10-29: qty 1

## 2012-10-29 NOTE — ED Notes (Addendum)
Pt reports experiencing abdominal pain since Wednesday. Pt states the pain started in her RLQ but is now in the middle and feels like it is moving to LLQ. Pt states she has mild nausea. Pt denies V/D. Denies hematuria, burning, frequency and discharge. LMP ended on 7/11. PT reports having abdominal pain with bowel movements. LBM today.

## 2012-10-29 NOTE — ED Notes (Signed)
PA-C at bedside 

## 2012-10-29 NOTE — ED Provider Notes (Signed)
History    CSN: [id number] Arrival date & time 10/29/12  0607  First MD Initiated Contact with Patient 10/29/12 [phone]     Chief Complaint  Patient presents with  . Abdominal Pain   (Consider location/radiation/quality/duration/timing/severity/associated sxs/prior Treatment) HPI Pt is a 32yo female presenting today with lower abdominal pain that started on Wednesday 7/16.  Pain is constant, sharp, started in RLQ now worse in LLQ and suprapubic region, 8/10, worse with bending over. Occasionally radiates into vagina.  Associated with mild nausea this morning.  Denies vaginal discharge. Denies fever, vomiting, diarrhea, or urinary symptoms.  Last BM was this morning, felt a little strained but states she had 2 nl BMs the last 2 days.  Pt has never been pregnant and not concerned for STDs.  LMP ended 7/11, nl per pt.  Pt reports hx of fibroids.  Has tried Advil without relief.  Pt sees Dr. Allyne Gee, PCP.  No known medical problems besides fibroids.  No hx of abdominal surgery.    History reviewed. No pertinent past medical history. History reviewed. No pertinent past surgical history. No family history on file. History  Substance Use Topics  . Smoking status: Never Smoker   . Smokeless tobacco: Not on file  . Alcohol Use: No   OB History   Grav Para Term Preterm Abortions TAB SAB Ect Mult Living                 Review of Systems  Constitutional: Negative for fever and chills.  Gastrointestinal: Positive for nausea and abdominal pain. Negative for vomiting, diarrhea, constipation and blood in stool.  Genitourinary: Positive for vaginal pain and pelvic pain. Negative for dysuria, hematuria, flank pain, vaginal bleeding and vaginal discharge.  All other systems reviewed and are negative.    Allergies  Review of patient's allergies indicates no known allergies.  Home Medications   Current Outpatient Rx  Name  Route  Sig  Dispense  Refill  . CAMILA 0.35 MG tablet   Oral   Take 1  tablet by mouth daily.          Marland Kitchen ibuprofen (ADVIL,MOTRIN) 200 MG tablet   Oral   Take 600-800 mg by mouth every 6 (six) hours as needed. For pain          . HYDROcodone-acetaminophen (NORCO/VICODIN) 5-325 MG per tablet   Oral   Take 2 tablets by mouth every 4 (four) hours as needed for pain.   6 tablet   0   . ibuprofen (ADVIL,MOTRIN) 800 MG tablet   Oral   Take 1 tablet (800 mg total) by mouth 3 (three) times daily.   21 tablet   0    BP 135/101  Pulse 91  Temp(Src) 98.1 F (36.7 C) (Oral)  Resp 16  SpO2 100%  LMP 10/22/2012 Physical Exam  Nursing note and vitals reviewed. Constitutional: She appears well-developed and well-nourished.  Pt lying comfortably in exam bed, NAD.     HENT:  Head: Normocephalic and atraumatic.  Eyes: Conjunctivae are normal. No scleral icterus.  Neck: Normal range of motion.  Cardiovascular: Normal rate, regular rhythm and normal heart sounds.   Pulmonary/Chest: Effort normal and breath sounds normal. No respiratory distress. She has no wheezes. She has no rales. She exhibits no tenderness.  Abdominal: Soft. Bowel sounds are normal. She exhibits no distension and no mass. There is tenderness (LLQ, suprapubic region). There is no rebound and no guarding. Hernia confirmed negative in the right inguinal area and  confirmed negative in the left inguinal area.  Abd is soft, obese, mild distention (per pt), TTP LLQ and suprapubic region  Genitourinary: Vagina normal and uterus normal. Pelvic exam was performed with patient supine. No labial fusion. There is no rash, tenderness, lesion or injury on the right labia. There is no rash, tenderness, lesion or injury on the left labia. Cervix exhibits no motion tenderness, no discharge and no friability. Right adnexum displays no mass, no tenderness and no fullness. Left adnexum displays tenderness. Left adnexum displays no mass and no fullness. No erythema, tenderness or bleeding around the vagina. No  foreign body around the vagina. No signs of injury around the vagina. No vaginal discharge found.  Body habitus limits exam, pt had left adnexal tenderness, no masses appreciated   Musculoskeletal: Normal range of motion.  Lymphadenopathy:       Right: No inguinal adenopathy present.       Left: No inguinal adenopathy present.  Neurological: She is alert.  Skin: Skin is warm and dry.    ED Course  Procedures (including critical care time) Labs Reviewed  URINALYSIS, ROUTINE W REFLEX MICROSCOPIC - Abnormal; Notable for the following:    APPearance CLOUDY (*)    All other components within normal limits  WET PREP, GENITAL  POCT PREGNANCY, URINE   US Pelvis Complete  10/29/2012   *RADIOLOGY REPORT*  Clinical Data: Left lower quadrant abdominal pain, history of uterine fibroids  TRANSABDOMINAL ULTRASOUND OF PELVIS  Technique:  Transabdominal ultrasound examination of the pelvis was performed including evaluation of the uterus, ovaries, adnexal regions, and pelvic cul-de-sac.  Comparison:  Pelvic ultrasound - 07/14/2012 (report only, no images)  Findings:  Uterus:  Enlarged measuring 14.2 x 8.7 x 9.6 cm.  Multiple fibroids are seen within the uterus with dominant partially exophytic mixed echogenic partially shadowing fibroid arising from the left aspect of the uterine fundus measuring approximately 4.9 x 4.1 by 5.1 cm (images 29 through 26). There is an additional mixed echogenic approximately 3.4 x 3.4 x 3.4 cm fibroid within the dome of the right uterine fundus (images 10 through 13) which appears to have peripheral increased echogenicity with shadowing suggesting calcification.  Endometrium: Difficult to visualize secondary to the multiple uterine fibroids but appears normal in thickness for age measuring approximately 10 mm in diameter (image 9).  Right ovary: Minimally enlarged measuring 5.4 x 2.8 x 3.7 cm likely secondary to containing an approximately 2.8 x 1.7 x 1.9 cm anechoic adnexal cyst.   Left ovary: Normal in size measuring approximately 0.6 x 2.3 x 2.6 cm and contains approximately 2.5 x 1.7 x 1.7 cm anechoic adnexal cyst.  Other Findings:  No free fluid  IMPRESSION:  1.  No definite acute findings within the pelvis. 2.  Enlarged myomatous uterus. - Note, the patient's known fibroids may represent the etiology of the patient's left lower quadrant abdominal pain, especially given the dominant approximately 5.1 cm exophytic fibroid arising from the left lateral aspect of the uterine fundus. Clinical correlation is advised.  3.  Bilateral adnexal cysts, each measuring less than 3 cm - almost certainly benign, and no specific imaging follow up is recommended according to the Society of Radiologists in Ultrasound 2010 Consensus  Conference Statement (D Lenis Noon et al. Management of Asymptomatic Ovarian and Other Adnexal Cysts Imaged at [place]:  Society of Radiologists in Ultrasound Consensus Conference Statement 2010. Radiology 256 (Sept 2010): 943-954.).   Original Report Authenticated By: Tacey Ruiz, MD   1. LLQ abdominal  pain   2. Uterine fibroid     MDM  DDx: uterine fibroids, constipation, diverticulitis, ectopic (low likelihood as pt appears well, vitals: unremarkable), UA, renal stone Will get UA, urine preg, perform pelvic exam.  Will consider additional labs or testing after pelvic.  May need pelvic U/S vs Acute Abd series.   Tx: ibuprofen 800mg     Urine preg: neg  UA: nl Wet prep: unremarkable  GC/Chlamydia not ordered. Pt stated she was not concerned for STD, did not feel she needed to be tested.  On exam no vaginal discharge.  Will inform pt to f/u with health department if she decides she does need further testing later.   Pt still in moderate pain, worse after pelvic exam. Tx: morphine.  Pt had left adnexal tenderness, will get pelvic U/S  U/S: 5.1cm exophytic fibroid arising from left lateral aspect of uterine fundus.  Likely cause of pt's pain.  Pt states she has f/u  appointment with Dr. Phineas Real, PCP this afternoon and her GYN on Monday.  Advised pt to keep both appointments and let them know what was found today.  They will give further guidance as far as treatment/pain management of fibroids.    Rx: norco and ibuprofen. Return precautions given. Pt verbalized understanding and agreement with tx plan. Vitals: unremarkable. Discharged in stable condition.    Discussed pt with attending during ED encounter.       Junius Finner, PA-C 10/29/12 2956  Brandt Loosen, MD 10/31/12 [phone]

## 2012-10-29 NOTE — ED Notes (Signed)
Pelvic cart at bedside. 

## 2012-10-29 NOTE — ED Notes (Signed)
Patient transported to Ultrasound 

## 2012-10-29 NOTE — ED Notes (Signed)
Pt in restroom when RN went into room to triage.

## 2012-11-01 ENCOUNTER — Encounter: Payer: Self-pay | Admitting: Obstetrics & Gynecology

## 2012-11-01 ENCOUNTER — Encounter: Payer: Self-pay | Admitting: *Deleted

## 2012-11-01 ENCOUNTER — Ambulatory Visit (INDEPENDENT_AMBULATORY_CARE_PROVIDER_SITE_OTHER): Payer: BC Managed Care – HMO | Admitting: Obstetrics & Gynecology

## 2012-11-01 VITALS — BP 118/76 | HR 77 | Temp 97.8°F | Ht 64.0 in | Wt 221.8 lb

## 2012-11-01 DIAGNOSIS — N83209 Unspecified ovarian cyst, unspecified side: Secondary | ICD-10-CM

## 2012-11-01 DIAGNOSIS — Z3202 Encounter for pregnancy test, result negative: Secondary | ICD-10-CM

## 2012-11-01 LAB — POCT URINE PREGNANCY: Preg Test, Ur: NEGATIVE

## 2012-11-01 MED ORDER — HYDROCODONE-ACETAMINOPHEN 5-325 MG PO TABS: 2.0000 | ORAL_TABLET | Freq: Four times a day (QID) | ORAL | Status: DC | PRN

## 2012-11-01 NOTE — Progress Notes (Signed)
.   Subjective:     Felicia Pham is a 32 y.o. female here for a hospital follow up.  Current complaints: Pelvic pain - she went to [facility] this weekend and had an ultrasound done that shows fibroids and bilateral cysts..  Personal health questionnaire reviewed: no.   Gynecologic History Patient's last menstrual period was 10/15/2012. Contraception: OCP (estrogen/progesterone) Last Pap: 11/2011. Results were: normal Last mammogram: N/A  Obstetric History OB History   Grav Para Term Preterm Abortions TAB SAB Ect Mult Living                   The following portions of the patient's history were reviewed and updated as appropriate: allergies, current medications, past family history, past medical history, past social history, past surgical history and problem list.  Review of Systems Pertinent items are noted in HPI.    Objective:     No exam today.     Assessment:   Uterine fibroids Likely functional ovarian cysts Pelvic pain  Plan:   Low potency narcotics Pt considering surgery--going to obtain a second opinion Return prn

## 2012-11-02 DIAGNOSIS — N83209 Unspecified ovarian cyst, unspecified side: Secondary | ICD-10-CM | POA: Insufficient documentation

## 2012-11-02 NOTE — Patient Instructions (Signed)
Ovarian Cyst The ovaries are small organs that are on each side of the uterus. The ovaries are the organs that produce the female hormones, estrogen and progesterone. An ovarian cyst is a sac filled with fluid that can vary in its size. It is normal for a small cyst to form in women who are in the childbearing age and who have menstrual periods. This type of cyst is called a follicle cyst that becomes an ovulation cyst (corpus luteum cyst) after it produces the women's egg. It later goes away on its own if the woman does not become pregnant. There are other kinds of ovarian cysts that may cause problems and may need to be treated. The most serious problem is a cyst with cancer. It should be noted that menopausal women who have an ovarian cyst are at a higher risk of it being a cancer cyst. They should be evaluated very quickly, thoroughly and followed closely. This is especially true in menopausal women because of the high rate of ovarian cancer in women in menopause. CAUSES AND TYPES OF OVARIAN CYSTS:  FUNCTIONAL CYST: The follicle/corpus luteum cyst is a functional cyst that occurs every month during ovulation with the menstrual cycle. They go away with the next menstrual cycle if the woman does not get pregnant. Usually, there are no symptoms with a functional cyst.  ENDOMETRIOMA CYST: This cyst develops from the lining of the uterus tissue. This cyst gets in or on the ovary. It grows every month from the bleeding during the menstrual period. It is also called a "chocolate cyst" because it becomes filled with blood that turns brown. This cyst can cause pain in the lower abdomen during intercourse and with your menstrual period.  CYSTADENOMA CYST: This cyst develops from the cells on the outside of the ovary. They usually are not cancerous. They can get very big and cause lower abdomen pain and pain with intercourse. This type of cyst can twist on itself, cut off its blood supply and cause severe pain. It  also can easily rupture and cause a lot of pain.  DERMOID CYST: This type of cyst is sometimes found in both ovaries. They are found to have different kinds of body tissue in the cyst. The tissue includes skin, teeth, hair, and/or cartilage. They usually do not have symptoms unless they get very big. Dermoid cysts are rarely cancerous.  POLYCYSTIC OVARY: This is a rare condition with hormone problems that produces many small cysts on both ovaries. The cysts are follicle-like cysts that never produce an egg and become a corpus luteum. It can cause an increase in body weight, infertility, acne, increase in body and facial hair and lack of menstrual periods or rare menstrual periods. Many women with this problem develop type 2 diabetes. The exact cause of this problem is unknown. A polycystic ovary is rarely cancerous.  THECA LUTEIN CYST: Occurs when too much hormone (human chorionic gonadotropin) is produced and over-stimulates the ovaries to produce an egg. They are frequently seen when doctors stimulate the ovaries for invitro-fertilization (test tube babies).  LUTEOMA CYST: This cyst is seen during pregnancy. Rarely it can cause an obstruction to the birth canal during labor and delivery. They usually go away after delivery. SYMPTOMS   Pelvic pain or pressure.  Pain during sexual intercourse.  Increasing girth (swelling) of the abdomen.  Abnormal menstrual periods.  Increasing pain with menstrual periods.  You stop having menstrual periods and you are not pregnant. DIAGNOSIS  The diagnosis can   be made during:  Routine or annual pelvic examination (common).  Ultrasound.  X-ray of the pelvis.  CT Scan.  MRI.  Blood tests. TREATMENT   Treatment may only be to follow the cyst monthly for 2 to 3 months with your caregiver. Many go away on their own, especially functional cysts.  May be aspirated (drained) with a long needle with ultrasound, or by laparoscopy (inserting a tube into  the pelvis through a small incision).  The whole cyst can be removed by laparoscopy.  Sometimes the cyst may need to be removed through an incision in the lower abdomen.  Hormone treatment is sometimes used to help dissolve certain cysts.  Birth control pills are sometimes used to help dissolve certain cysts. HOME CARE INSTRUCTIONS  Follow your caregiver's advice regarding:  Medicine.  Follow up visits to evaluate and treat the cyst.  You may need to come back or make an appointment with another caregiver, to find the exact cause of your cyst, if your caregiver is not a gynecologist.  Get your yearly and recommended pelvic examinations and Pap tests.  Let your caregiver know if you have had an ovarian cyst in the past. SEEK MEDICAL CARE IF:   Your periods are late, irregular, they stop, or are painful.  Your stomach (abdomen) or pelvic pain does not go away.  Your stomach becomes larger or swollen.  You have pressure on your bladder or trouble emptying your bladder completely.  You have painful sexual intercourse.  You have feelings of fullness, pressure, or discomfort in your stomach.  You lose weight for no apparent reason.  You feel generally ill.  You become constipated.  You lose your appetite.  You develop acne.  You have an increase in body and facial hair.  You are gaining weight, without changing your exercise and eating habits.  You think you are pregnant. SEEK IMMEDIATE MEDICAL CARE IF:   You have increasing abdominal pain.  You feel sick to your stomach (nausea) and/or vomit.  You develop a fever that comes on suddenly.  You develop abdominal pain during a bowel movement.  Your menstrual periods become heavier than usual. Document Released: 03/31/2005 Document Revised: 06/23/2011 Document Reviewed: 02/01/2009 [facility] Patient Information 2014 [place], [place]. Myomectomy Myoma is a non-cancerous tumor made up of fibrous tissue. It is also  called leiomyoma, but more often called a fibroid tumor. Myomectomy is the removal of a fibroid tumor without removing another organ, like the uterus or ovary, with it. Fibroids range from the size of a pea to a grapefruit. They are rarely cancerous. Myomas only need treatment when they are growing or when they cause symptoms, such aspain, pressure, bleeding, and pain with intercourse. LET YOUR CAREGIVER KNOW ABOUT:  Any allergies, especially to medicines.  If you develop a cold or an infection before your surgery.  Medicines taken, including vitamins, herbs, eyedrops, over-ther-counter medicines, and creams.  Use of steroids (by mouth or creams).  Previous problems with numbing medicines.  History of blood clots or other bleeding problems.  Other health problems, such as diabetes, kidney, heart, or lung problems.  Previous surgery.  Possibility of pregnancy, if this applies. RISKS AND COMPLICATIONS   Excessive bleeding.  Infection.  Injury to other organs.  Blood clots in the legs, chest, and brain.  Scar tissue (adhesions) on other organs and in the pelvis.  Death during or after the surgery. BEFORE THE PROCEDURE  Follow your caregiver's advice regarding your surgery and preparing for surgery.  Avoid taking aspirin or blood thinners as directed by your caregiver.  DO NOT eat or drink anything after midnight on the night before surgery, or as directed by your caregiver.  DO NOT smoke (if you smoke) for 2 weeks before the surgery.  DO NOT drink alcohol the day before the surgery.  If you are admitted the day of the surgery,arrive1 hour before your surgery is scheduled.  Arrange to have someone take you home from the hospital.  Arrange to have someone care for you when you go home. PROCEDURE There are several ways to perform a myomectomy:  Hysteroscopy myomectomy. A lighted tube is inserted inside the uterus. The tube will remove the fibroid. This is used when the  fibroid is inside the cavity of the uterus.  Laparoscopic myomectomy. A long, lighted tube is inserted through 2 or 3 small incisions to see the organs in the pelvis. The fibroid is removed.  Myomectomy through a sugical cut (incicion) in the abdomen. The fibroid is removed through an incision made in the stomach. This way is performed when thethe fibroid cannot be removed with a hysteroscope or laprascope. AFTER THE PROCEDURE  If you had laparoscopic or hysteroscopic myomectomy, you may go home the same day or stay overnight.  If you had abdominal myomectomy, you may stay in the hospital a few days.  Your intravenous (IV)access tube and catheter will be removed in 1 or 2 days.  If you stay in the hospital, your caregiver will order pain medicine and a sleeping pill, if needed.  You may be placed on an antibiotic medicine, if needed.  You may be given written instructions and medicines before you are sent home. Document Released: 01/26/2007 Document Revised: 06/23/2011 Document Reviewed: 02/07/2009 [facility] Patient Information 2014 [place], [place].

## 2012-11-17 DIAGNOSIS — R102 Pelvic and perineal pain: Secondary | ICD-10-CM | POA: Insufficient documentation

## 2012-11-19 ENCOUNTER — Encounter: Payer: Self-pay | Admitting: Obstetrics & Gynecology

## 2013-02-10 ENCOUNTER — Other Ambulatory Visit: Payer: Self-pay | Admitting: Endocrinology

## 2013-02-10 DIAGNOSIS — E041 Nontoxic single thyroid nodule: Secondary | ICD-10-CM

## 2013-02-25 DIAGNOSIS — D508 Other iron deficiency anemias: Secondary | ICD-10-CM | POA: Insufficient documentation

## 2013-05-17 ENCOUNTER — Emergency Department (HOSPITAL_COMMUNITY)
Admission: EM | Admit: 2013-05-17 | Discharge: 2013-05-17 | Disposition: A | Discharge status: Home / Self Care | Payer: BC Managed Care – PPO | Attending: Emergency Medicine | Admitting: Emergency Medicine

## 2013-05-17 ENCOUNTER — Emergency Department (HOSPITAL_COMMUNITY): Payer: BC Managed Care – PPO

## 2013-05-17 ENCOUNTER — Encounter (HOSPITAL_COMMUNITY): Payer: Self-pay | Admitting: Emergency Medicine

## 2013-05-17 DIAGNOSIS — S4980XA Other specified injuries of shoulder and upper arm, unspecified arm, initial encounter: Secondary | ICD-10-CM | POA: Insufficient documentation

## 2013-05-17 DIAGNOSIS — Y9389 Activity, other specified: Secondary | ICD-10-CM | POA: Insufficient documentation

## 2013-05-17 DIAGNOSIS — S298XXA Other specified injuries of thorax, initial encounter: Secondary | ICD-10-CM | POA: Insufficient documentation

## 2013-05-17 DIAGNOSIS — S46909A Unspecified injury of unspecified muscle, fascia and tendon at shoulder and upper arm level, unspecified arm, initial encounter: Secondary | ICD-10-CM | POA: Insufficient documentation

## 2013-05-17 DIAGNOSIS — Y92410 Unspecified street and highway as the place of occurrence of the external cause: Secondary | ICD-10-CM | POA: Insufficient documentation

## 2013-05-17 DIAGNOSIS — Z79899 Other long term (current) drug therapy: Secondary | ICD-10-CM | POA: Insufficient documentation

## 2013-05-17 MED ORDER — TRAMADOL HCL 50 MG PO TABS: 50.0000 mg | ORAL_TABLET | Freq: Four times a day (QID) | ORAL | Status: DC | PRN

## 2013-05-17 MED ORDER — IBUPROFEN 800 MG PO TABS
800.0000 mg | ORAL_TABLET | Freq: Once | ORAL | Status: AC
  Administered 2013-05-17: 800 mg via ORAL
  Filled 2013-05-17: qty 1

## 2013-05-17 MED ORDER — CYCLOBENZAPRINE HCL 10 MG PO TABS: 10.0000 mg | ORAL_TABLET | Freq: Two times a day (BID) | ORAL | Status: DC | PRN
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
## 2013-05-17 NOTE — Discharge Instructions (Signed)
Take Tramadol as needed for pain. Take Flexeril as needed for muscle spasm. You may take these medications together. Refer to attached documents for more information. Return to the ED with worsening or concerning symptoms.
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
## 2013-05-18 NOTE — ED Provider Notes (Signed)
Medical screening examination/treatment/procedure(s) were performed by non-physician practitioner and as supervising physician I was immediately available for consultation/collaboration.  EKG Interpretation   None         Zyliah Schier B. Karle Starch, MD 05/18/13 1610

## 2013-05-26 DIAGNOSIS — M25519 Pain in unspecified shoulder: Secondary | ICD-10-CM | POA: Insufficient documentation

## 2013-05-26 DIAGNOSIS — IMO0001 Reserved for inherently not codable concepts without codable children: Secondary | ICD-10-CM | POA: Insufficient documentation

## 2013-10-25 ENCOUNTER — Other Ambulatory Visit: Payer: Self-pay | Admitting: *Deleted

## 2013-10-25 DIAGNOSIS — Z3041 Encounter for surveillance of contraceptive pills: Secondary | ICD-10-CM

## 2013-10-25 MED ORDER — NORETHINDRONE 0.35 MG PO TABS: 1.0000 | ORAL_TABLET | Freq: Every day | ORAL | Status: DC

## 2013-11-09 ENCOUNTER — Encounter: Payer: Self-pay | Admitting: Obstetrics & Gynecology

## 2013-11-09 ENCOUNTER — Ambulatory Visit (INDEPENDENT_AMBULATORY_CARE_PROVIDER_SITE_OTHER): Payer: BC Managed Care – HMO | Admitting: Obstetrics & Gynecology

## 2013-11-09 VITALS — BP 107/58 | HR 88 | Temp 98.0°F | Ht 65.0 in | Wt 223.0 lb

## 2013-11-09 DIAGNOSIS — Z3041 Encounter for surveillance of contraceptive pills: Secondary | ICD-10-CM

## 2013-11-09 DIAGNOSIS — Z0000 Encounter for general adult medical examination without abnormal findings: Secondary | ICD-10-CM

## 2013-11-09 DIAGNOSIS — Z113 Encounter for screening for infections with a predominantly sexual mode of transmission: Secondary | ICD-10-CM

## 2013-11-09 DIAGNOSIS — Z01419 Encounter for gynecological examination (general) (routine) without abnormal findings: Secondary | ICD-10-CM

## 2013-11-09 MED ORDER — NORETHINDRONE 0.35 MG PO TABS: 1.0000 | ORAL_TABLET | Freq: Every day | ORAL | Status: DC

## 2013-11-09 NOTE — Progress Notes (Signed)
Subjective:     Felicia Pham is a 33 y.o. female here for a routine exam.  Current complaints: none.    Personal health questionnaire:  Is patient [name], have a family history of breast and/or ovarian cancer: no Is there a family history of uterine cancer diagnosed at age < 23, gastrointestinal cancer, urinary tract cancer, family member who is a Field seismologist syndrome-associated carrier: no Is the patient overweight and hypertensive, family history of diabetes, personal history of gestational diabetes or PCOS: no Is patient over 23, have PCOS,  family history of premature CHD under age 45, diabetes, smoke, have hypertension or peripheral artery disease:  no At any time, has a partner hit, kicked or otherwise hurt or frightened you?: no Over the past 2 weeks, have you felt down, depressed or hopeless?: no Over the past 2 weeks, have you felt little interest or pleasure in doing things?:no   Gynecologic History Patient's last menstrual period was 10/30/2013. Contraception: OCP (estrogen/progesterone) Last Pap results were: normal   Obstetric History OB History  Gravida Para Term Preterm AB SAB TAB Ectopic Multiple Living  0 0 0 0 0 0 0 0 0 0         History reviewed. No pertinent past medical history.  Past Surgical History  Procedure Laterality Date  . Myomectomy      Current outpatient prescriptions:cyclobenzaprine (FLEXERIL) 10 MG tablet, Take 1 tablet (10 mg total) by mouth 2 (two) times daily as needed for muscle spasms., Disp: 20 tablet, Rfl: 0;  ibuprofen (ADVIL,MOTRIN) 200 MG tablet, Take 600-800 mg by mouth every 6 (six) hours as needed. For pain , Disp: , Rfl: ;  norethindrone (MICRONOR,CAMILA,ERRIN) 0.35 MG tablet, Take 1 tablet (0.35 mg total) by mouth daily., Disp: 1 Package, Rfl: 11 traMADol (ULTRAM) 50 MG tablet, Take 1 tablet (50 mg total) by mouth every 6 (six) hours as needed., Disp: 15 tablet, Rfl: 0 No Known Allergies  History  Substance Use Topics  .  Smoking status: Never Smoker   . Smokeless tobacco: Never Used  . Alcohol Use: No    Family History  Problem Relation Age of Onset  . Lupus Mother   . Hypertension Mother   . Hypertension Father   . Diabetes Father       Review of Systems  Constitutional: negative for fatigue and weight loss Respiratory: negative for cough and wheezing Cardiovascular: negative for chest pain, fatigue and palpitations Gastrointestinal: negative for abdominal pain and change in bowel habits Musculoskeletal:negative for myalgias Neurological: negative for gait problems and tremors Behavioral/Psych: negative for abusive relationship, depression Endocrine: negative for temperature intolerance   Genitourinary:negative for abnormal menstrual periods, genital lesions, hot flashes, sexual problems and vaginal discharge Integument/breast: negative for breast lump, breast tenderness, nipple discharge and skin lesion(s)    Objective:       BP 107/58  Pulse 88  Temp(Src) 98 F (36.7 C)  Ht 5\' 5"  (1.651 m)  Wt 101.152 kg (223 lb)  BMI 37.11 kg/m2  LMP 10/30/2013 General:   alert  Skin:   no rash or abnormalities  Lungs:   clear to auscultation bilaterally  Heart:   regular rate and rhythm, S1, S2 normal, no murmur, click, rub or gallop  Breasts:   normal without suspicious masses, skin or nipple changes or axillary nodes  Abdomen:  normal findings: no organomegaly, soft, non-tender and no hernia  Pelvis:  External genitalia: normal general appearance Urinary system: urethral meatus normal and bladder without fullness, nontender Vaginal:  normal without tenderness, induration or masses Cervix: normal appearance Adnexa: normal bimanual exam Uterus: anteverted and non-tender, normal size   Lab Review  Labs reviewed no Radiologic studies reviewed no    Assessment:    Healthy female exam.    Plan:    Education reviewed: calcium supplements, low fat, low cholesterol diet, safe sex/STD  prevention and weight bearing exercise.   Meds ordered this encounter  Medications  . norethindrone (MICRONOR,CAMILA,ERRIN) 0.35 MG tablet    Sig: Take 1 tablet (0.35 mg total) by mouth daily.    Dispense:  1 Package    Refill:  11   Orders Placed This Encounter  Procedures  . HIV antibody  . RPR    Follow up as needed.

## 2013-11-10 LAB — HIV ANTIBODY (ROUTINE TESTING W REFLEX): HIV 1&2 Ab, 4th Generation: NONREACTIVE

## 2013-11-10 LAB — RPR

## 2013-11-11 LAB — PAP IG, CT-NG NAA, HPV HIGH-RISK
Chlamydia Probe Amp: NEGATIVE
GC Probe Amp: NEGATIVE
HPV DNA High Risk: DETECTED — AB

## 2013-11-14 NOTE — Patient Instructions (Signed)

## 2013-11-15 ENCOUNTER — Encounter: Payer: Self-pay | Admitting: Obstetrics & Gynecology

## 2013-11-15 DIAGNOSIS — R87612 Low grade squamous intraepithelial lesion on cytologic smear of cervix (LGSIL): Secondary | ICD-10-CM | POA: Insufficient documentation

## 2013-12-12 ENCOUNTER — Other Ambulatory Visit: Payer: Self-pay | Admitting: Obstetrics

## 2013-12-12 ENCOUNTER — Encounter: Payer: Self-pay | Admitting: Obstetrics & Gynecology

## 2013-12-12 ENCOUNTER — Ambulatory Visit (INDEPENDENT_AMBULATORY_CARE_PROVIDER_SITE_OTHER): Payer: BC Managed Care – HMO | Admitting: Obstetrics

## 2013-12-12 VITALS — BP 121/80 | HR 92 | Temp 98.9°F | Ht 64.0 in | Wt 225.0 lb

## 2013-12-12 DIAGNOSIS — R87612 Low grade squamous intraepithelial lesion on cytologic smear of cervix (LGSIL): Secondary | ICD-10-CM

## 2013-12-12 DIAGNOSIS — Z01812 Encounter for preprocedural laboratory examination: Secondary | ICD-10-CM

## 2013-12-12 DIAGNOSIS — Z3202 Encounter for pregnancy test, result negative: Secondary | ICD-10-CM

## 2013-12-12 LAB — POCT URINE PREGNANCY: Preg Test, Ur: NEGATIVE

## 2013-12-12 NOTE — Progress Notes (Signed)
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 The prior pap showed no abnormalities.  Prior cervical/vaginal disease: none. Prior cervical treatment: no treatment.  Procedure Details  The risks and benefits of the procedure and Written informed consent obtained.  A time-out was performed confirming the patient, procedure and allergy status  Speculum placed in vagina and excellent visualization of cervix achieved, cervix swabbed x 3 with acetic acid solution.  Findings: Cervix: acetowhite lesion(s) noted at 7 o'clock; SCJ visualized 360 degrees without lesions.   ECC done and biopsies taken at 7 and 12  o' clock. Vaginal inspection: normal without visible lesions. Vulvar colposcopy: vulvar colposcopy not performed.   Physical Exam   Specimens: ECC and Cervical Biopsies  Complications: none.  Plan: Specimens labelled and sent to Pathology. Will base further treatment on Pathology findings. Post biopsy instructions given to patient. Return to discuss Pathology results in 2 weeks.

## 2013-12-13 ENCOUNTER — Other Ambulatory Visit: Payer: Self-pay | Admitting: Obstetrics

## 2013-12-13 DIAGNOSIS — B9689 Other specified bacterial agents as the cause of diseases classified elsewhere: Secondary | ICD-10-CM

## 2013-12-13 DIAGNOSIS — N760 Acute vaginitis: Principal | ICD-10-CM

## 2013-12-13 LAB — WET PREP BY MOLECULAR PROBE
Candida species: NEGATIVE
Gardnerella vaginalis: POSITIVE — AB
Trichomonas vaginosis: NEGATIVE

## 2013-12-13 MED ORDER — METRONIDAZOLE 500 MG PO TABS: 500.0000 mg | ORAL_TABLET | Freq: Two times a day (BID) | ORAL | Status: DC

## 2013-12-26 ENCOUNTER — Ambulatory Visit: Payer: BC Managed Care – HMO | Admitting: Obstetrics & Gynecology

## 2014-01-05 ENCOUNTER — Ambulatory Visit (INDEPENDENT_AMBULATORY_CARE_PROVIDER_SITE_OTHER): Payer: BC Managed Care – HMO | Admitting: Obstetrics

## 2014-01-05 ENCOUNTER — Encounter: Payer: Self-pay | Admitting: Obstetrics

## 2014-01-05 VITALS — BP 127/79 | HR 88 | Temp 98.3°F | Ht 64.0 in | Wt 229.0 lb

## 2014-01-05 DIAGNOSIS — N870 Mild cervical dysplasia: Secondary | ICD-10-CM

## 2014-01-05 NOTE — Progress Notes (Signed)
Patient with LGSIL on pap.  S/P colposcopy and ECC and biopsies of cervix.  Presents today for results.  ECC was negative and cervical biopsies revealed LGSIL.  A/P:  LGSIL.  Repeat pap q 6 months.

## 2014-01-30 ENCOUNTER — Other Ambulatory Visit: Payer: BC Managed Care – PPO

## 2014-02-02 ENCOUNTER — Ambulatory Visit
Admission: RE | Admit: 2014-02-02 | Discharge: 2014-02-02 | Disposition: A | Discharge status: Home / Self Care | Payer: BC Managed Care – PPO | Source: Ambulatory Visit | Attending: Endocrinology | Admitting: Endocrinology

## 2014-02-02 DIAGNOSIS — E041 Nontoxic single thyroid nodule: Secondary | ICD-10-CM

## 2014-03-20 ENCOUNTER — Telehealth: Payer: Self-pay | Admitting: *Deleted

## 2014-03-20 DIAGNOSIS — B373 Candidiasis of vulva and vagina: Secondary | ICD-10-CM

## 2014-03-20 DIAGNOSIS — B3731 Acute candidiasis of vulva and vagina: Secondary | ICD-10-CM

## 2014-03-20 MED ORDER — FLUCONAZOLE 150 MG PO TABS: 150.0000 mg | ORAL_TABLET | ORAL | Status: DC

## 2014-03-20 NOTE — Telephone Encounter (Signed)
Patient wants an appointment or Rx. 3:04 Call to patient- patient c/o itching and irritation. Patient started her cycle yesterday and c/o itching. Offered patient an appointment- and patient declined all offered times due to her work schedule. Offered to treat yeast symptom and patient to call back if symptoms do not improve. Patient agreeable. Rx sent to pharmacy.

## 2014-04-10 ENCOUNTER — Encounter: Payer: Self-pay | Admitting: *Deleted

## 2014-04-11 ENCOUNTER — Encounter: Payer: Self-pay | Admitting: Obstetrics & Gynecology

## 2014-07-06 ENCOUNTER — Ambulatory Visit: Payer: BC Managed Care – HMO | Admitting: Obstetrics

## 2014-07-06 ENCOUNTER — Ambulatory Visit: Payer: BC Managed Care – HMO | Admitting: Obstetrics & Gynecology

## 2014-08-17 ENCOUNTER — Other Ambulatory Visit: Payer: Self-pay | Admitting: Nurse Practitioner

## 2014-08-17 ENCOUNTER — Other Ambulatory Visit: Payer: Self-pay | Admitting: *Deleted

## 2014-08-17 ENCOUNTER — Ambulatory Visit
Admission: RE | Admit: 2014-08-17 | Discharge: 2014-08-17 | Disposition: A | Discharge status: Home / Self Care | Payer: BC Managed Care – PPO | Source: Ambulatory Visit | Attending: *Deleted | Admitting: *Deleted

## 2014-08-17 DIAGNOSIS — M79672 Pain in left foot: Secondary | ICD-10-CM

## 2014-08-28 ENCOUNTER — Ambulatory Visit: Payer: BC Managed Care – PPO

## 2014-08-28 ENCOUNTER — Other Ambulatory Visit: Payer: Self-pay | Admitting: Family

## 2014-08-28 ENCOUNTER — Ambulatory Visit
Admission: RE | Admit: 2014-08-28 | Discharge: 2014-08-28 | Disposition: A | Discharge status: Home / Self Care | Payer: BC Managed Care – PPO | Source: Ambulatory Visit | Attending: Family | Admitting: Family

## 2014-08-28 DIAGNOSIS — R1032 Left lower quadrant pain: Secondary | ICD-10-CM

## 2014-09-07 ENCOUNTER — Other Ambulatory Visit: Payer: Self-pay | Admitting: Endocrinology

## 2014-09-07 DIAGNOSIS — E041 Nontoxic single thyroid nodule: Secondary | ICD-10-CM

## 2014-11-14 ENCOUNTER — Ambulatory Visit (INDEPENDENT_AMBULATORY_CARE_PROVIDER_SITE_OTHER): Payer: BC Managed Care – PPO | Admitting: Certified Nurse Midwife

## 2014-11-14 ENCOUNTER — Ambulatory Visit: Payer: Self-pay | Admitting: Certified Nurse Midwife

## 2014-11-14 ENCOUNTER — Encounter: Payer: Self-pay | Admitting: Certified Nurse Midwife

## 2014-11-14 VITALS — BP 126/80 | HR 100 | Temp 97.5°F | Ht 65.0 in | Wt 239.8 lb

## 2014-11-14 DIAGNOSIS — Z113 Encounter for screening for infections with a predominantly sexual mode of transmission: Secondary | ICD-10-CM

## 2014-11-14 DIAGNOSIS — N939 Abnormal uterine and vaginal bleeding, unspecified: Secondary | ICD-10-CM

## 2014-11-14 DIAGNOSIS — E669 Obesity, unspecified: Secondary | ICD-10-CM | POA: Diagnosis not present

## 2014-11-14 DIAGNOSIS — Z01419 Encounter for gynecological examination (general) (routine) without abnormal findings: Secondary | ICD-10-CM

## 2014-11-14 LAB — CBC WITH DIFFERENTIAL/PLATELET
Basophils Absolute: 0 10*3/uL (ref 0.0–0.1)
Basophils Relative: 0 % (ref 0–1)
Eosinophils Absolute: 0.1 10*3/uL (ref 0.0–0.7)
Eosinophils Relative: 1 % (ref 0–5)
HCT: 39.8 % (ref 36.0–46.0)
Hemoglobin: 13.2 g/dL (ref 12.0–15.0)
Lymphocytes Relative: 35 % (ref 12–46)
Lymphs Abs: 2.6 10*3/uL (ref 0.7–4.0)
MCH: 28.8 pg (ref 26.0–34.0)
MCHC: 33.2 g/dL (ref 30.0–36.0)
MCV: 86.7 fL (ref 78.0–100.0)
MPV: 9.6 fL (ref 8.6–12.4)
Monocytes Absolute: 0.4 10*3/uL (ref 0.1–1.0)
Monocytes Relative: 5 % (ref 3–12)
Neutro Abs: 4.4 10*3/uL (ref 1.7–7.7)
Neutrophils Relative %: 59 % (ref 43–77)
Platelets: 300 10*3/uL (ref 150–400)
RBC: 4.59 MIL/uL (ref 3.87–5.11)
RDW: 14.6 % (ref 11.5–15.5)
WBC: 7.5 10*3/uL (ref 4.0–10.5)

## 2014-11-14 LAB — HIV ANTIBODY (ROUTINE TESTING W REFLEX): HIV 1&2 Ab, 4th Generation: NONREACTIVE

## 2014-11-14 LAB — COMPREHENSIVE METABOLIC PANEL
ALT: 13 U/L (ref 6–29)
AST: 14 U/L (ref 10–30)
Albumin: 3.8 g/dL (ref 3.6–5.1)
Alkaline Phosphatase: 68 U/L (ref 33–115)
BUN: 13 mg/dL (ref 7–25)
CO2: 26 mmol/L (ref 20–31)
Calcium: 9.3 mg/dL (ref 8.6–10.2)
Chloride: 103 mmol/L (ref 98–110)
Creat: 0.85 mg/dL (ref 0.50–1.10)
Glucose, Bld: 78 mg/dL (ref 65–99)
Potassium: 4.5 mmol/L (ref 3.5–5.3)
Sodium: 138 mmol/L (ref 135–146)
Total Bilirubin: 0.3 mg/dL (ref 0.2–1.2)
Total Protein: 6.9 g/dL (ref 6.1–8.1)

## 2014-11-14 LAB — RPR

## 2014-11-14 LAB — TSH: TSH: 1.446 u[IU]/mL (ref 0.350–4.500)

## 2014-11-14 LAB — HEPATITIS B SURFACE ANTIGEN: Hepatitis B Surface Ag: NEGATIVE

## 2014-11-14 LAB — CHOLESTEROL, TOTAL: Cholesterol: 112 mg/dL — ABNORMAL LOW (ref 125–200)

## 2014-11-14 LAB — HEPATITIS C ANTIBODY: HCV Ab: NEGATIVE

## 2014-11-14 LAB — TRIGLYCERIDES: Triglycerides: 45 mg/dL (ref <150)

## 2014-11-14 LAB — HDL CHOLESTEROL: HDL: 50 mg/dL (ref >=46)

## 2014-11-14 MED ORDER — NORGESTIM-ETH ESTRAD TRIPHASIC 0.18/0.215/0.25 MG-35 MCG PO TABS: 1.0000 | ORAL_TABLET | Freq: Every day | ORAL | Status: DC

## 2014-11-14 NOTE — Progress Notes (Signed)
Patient ID: Felicia Pham, female   DOB: 03-Nov-1980, 34 y.o.   MRN: [id number]    Subjective:        Felicia Pham is a 34 y.o. female here for a routine exam.  Current complaints: AUB: periods lasting 5-7 days, heavy with dysmenorrhea, denies clots.    Personal health questionnaire:  Is patient [name], have a family history of breast and/or ovarian cancer: no Is there a family history of uterine cancer diagnosed at age < 40, gastrointestinal cancer, urinary tract cancer, family member who is a Field seismologist syndrome-associated carrier: no Is the patient overweight and hypertensive, family history of diabetes, personal history of gestational diabetes, preeclampsia or PCOS: no Is patient over 40, have PCOS,  family history of premature CHD under age 71, diabetes, smoke, have hypertension or peripheral artery disease:  no At any time, has a partner hit, kicked or otherwise hurt or frightened you?: no Over the past 2 weeks, have you felt down, depressed or hopeless?: no Over the past 2 weeks, have you felt little interest or pleasure in doing things?:no   Gynecologic History Patient's last menstrual period was 10/29/2014. Contraception: OCP (estrogen/progesterone) Last Pap: 11/09/13. Results were: abnormal: LSIL + HPV, had colpo Last mammogram: N/A.   Obstetric History OB History  Gravida Para Term Preterm AB SAB TAB Ectopic Multiple Living  0 0 0 0 0 0 0 0 0 0         History reviewed. No pertinent past medical history.  Past Surgical History  Procedure Laterality Date  . Myomectomy       Current outpatient prescriptions:  .  ibuprofen (ADVIL,MOTRIN) 200 MG tablet, Take 600-800 mg by mouth every 6 (six) hours as needed. For pain , Disp: , Rfl:  .  norethindrone (MICRONOR,CAMILA,ERRIN) 0.35 MG tablet, Take 1 tablet (0.35 mg total) by mouth daily., Disp: 1 Package, Rfl: 11 .  fluconazole (DIFLUCAN) 150 MG tablet, Take 1 tablet (150 mg total) by mouth every other day.  (Patient not taking: Reported on 11/14/2014), Disp: 2 tablet, Rfl: 0 .  Norgestimate-Ethinyl Estradiol Triphasic (TRI-SPRINTEC) 0.18/0.215/0.25 MG-35 MCG tablet, Take 1 tablet by mouth daily., Disp: 1 Package, Rfl: 11 No Known Allergies  History  Substance Use Topics  . Smoking status: Never Smoker   . Smokeless tobacco: Never Used  . Alcohol Use: No    Family History  Problem Relation Age of Onset  . Lupus Mother   . Hypertension Mother   . Hypertension Father   . Diabetes Father       Review of Systems  Constitutional: negative for fatigue and weight loss Respiratory: negative for cough and wheezing Cardiovascular: negative for chest pain, fatigue and palpitations Gastrointestinal: negative for abdominal pain and change in bowel habits Musculoskeletal:negative for myalgias Neurological: negative for gait problems and tremors Behavioral/Psych: negative for abusive relationship, depression Endocrine: negative for temperature intolerance   Genitourinary:negative for abnormal menstrual periods, genital lesions, hot flashes, sexual problems and vaginal discharge Integument/breast: negative for breast lump, breast tenderness, nipple discharge and skin lesion(s)    Objective:       BP 126/80 mmHg  Pulse 100  Temp(Src) 97.5 F (36.4 C)  Ht 5\' 5"  (1.651 m)  Wt 239 lb 12.8 oz (108.773 kg)  BMI 39.90 kg/m2  LMP 10/29/2014 General:   alert  Skin:   no rash or abnormalities  Lungs:   clear to auscultation bilaterally  Heart:   regular rate and rhythm, S1, S2 normal, no murmur, click, rub  or gallop  Breasts:   normal without suspicious masses, skin or nipple changes or axillary nodes  Abdomen:  normal findings: no organomegaly, soft, non-tender and no hernia  Pelvis:  External genitalia: normal general appearance Urinary system: urethral meatus normal and bladder without fullness, nontender Vaginal: normal without tenderness, induration or masses Cervix: normal  appearance Adnexa: normal bimanual exam Uterus: anteverted and non-tender, normal size   Lab Review Urine pregnancy test Labs reviewed yes Radiologic studies reviewed no  50% of 30 min visit spent on counseling and coordination of care.   Assessment:    Healthy female exam.   Hx of fibroids AUB /c dysmenorrhea STD screening exam Contraception counseling   Plan:    Education reviewed: calcium supplements, depression evaluation, low fat, low cholesterol diet, safe sex/STD prevention, self breast exams, skin cancer screening and weight bearing exercise. Contraception: OCP (estrogen/progesterone). Follow up in: 1 year.   Meds ordered this encounter  Medications  . Norgestimate-Ethinyl Estradiol Triphasic (TRI-SPRINTEC) 0.18/0.215/0.25 MG-35 MCG tablet    Sig: Take 1 tablet by mouth daily.    Dispense:  1 Package    Refill:  11   Orders Placed This Encounter  Procedures  . US Transvaginal Non-OB    Standing Status: Future     Number of Occurrences:      Standing Expiration Date: 01/14/2016    Order Specific Question:  Reason for Exam (SYMPTOM  OR DIAGNOSIS REQUIRED)    Answer:  AUB, hx of fibroids    Order Specific Question:  Preferred imaging location?    Answer:  [facility]  . US Pelvis Complete    Standing Status: Future     Number of Occurrences:      Standing Expiration Date: 01/14/2016    Order Specific Question:  Reason for Exam (SYMPTOM  OR DIAGNOSIS REQUIRED)    Answer:  AUB, hx of fibroids    Order Specific Question:  Preferred imaging location?    Answer:  [name]  . HIV antibody (with reflex)  . Hepatitis B surface antigen  . RPR  . Hepatitis C antibody  . CBC with Differential/Platelet  . Comprehensive metabolic panel  . TSH  . Cholesterol, total  . Triglycerides  . HDL cholesterol

## 2014-11-14 NOTE — Addendum Note (Signed)
Addended by: Carole Binning on: 11/14/2014 11:47 AM   Modules accepted: Orders

## 2014-11-16 LAB — SURESWAB, VAGINOSIS/VAGINITIS PLUS
Atopobium vaginae: 6.8 Log (cells/mL)
C. albicans, DNA: NOT DETECTED
C. glabrata, DNA: NOT DETECTED
C. parapsilosis, DNA: NOT DETECTED
C. trachomatis RNA, TMA: NOT DETECTED
C. tropicalis, DNA: NOT DETECTED
Gardnerella vaginalis: >8 Log (cells/mL)
LACTOBACILLUS SPECIES: NOT DETECTED Log (cells/mL)
MEGASPHAERA SPECIES: NOT DETECTED Log (cells/mL)
N. gonorrhoeae RNA, TMA: NOT DETECTED
T. vaginalis RNA, QL TMA: NOT DETECTED

## 2014-11-17 ENCOUNTER — Other Ambulatory Visit: Payer: Self-pay | Admitting: Certified Nurse Midwife

## 2014-11-17 LAB — PAP IG AND HPV HIGH-RISK: HPV DNA High Risk: DETECTED — AB

## 2014-11-17 MED ORDER — METRONIDAZOLE 500 MG PO TABS: 500.0000 mg | ORAL_TABLET | Freq: Two times a day (BID) | ORAL | Status: DC

## 2014-11-27 ENCOUNTER — Ambulatory Visit (HOSPITAL_COMMUNITY)
Admission: RE | Admit: 2014-11-27 | Discharge: 2014-11-27 | Disposition: A | Discharge status: Home / Self Care | Payer: BC Managed Care – PPO | Source: Ambulatory Visit | Attending: Certified Nurse Midwife | Admitting: Certified Nurse Midwife

## 2014-11-27 DIAGNOSIS — D259 Leiomyoma of uterus, unspecified: Secondary | ICD-10-CM | POA: Insufficient documentation

## 2014-11-27 DIAGNOSIS — N939 Abnormal uterine and vaginal bleeding, unspecified: Secondary | ICD-10-CM | POA: Diagnosis not present

## 2014-11-27 DIAGNOSIS — R102 Pelvic and perineal pain: Secondary | ICD-10-CM | POA: Diagnosis not present

## 2014-11-28 ENCOUNTER — Other Ambulatory Visit: Payer: Self-pay | Admitting: Certified Nurse Midwife

## 2014-11-29 ENCOUNTER — Ambulatory Visit: Payer: BC Managed Care – HMO | Admitting: Obstetrics & Gynecology

## 2014-12-06 ENCOUNTER — Encounter: Payer: Self-pay | Admitting: Obstetrics

## 2014-12-06 ENCOUNTER — Ambulatory Visit (INDEPENDENT_AMBULATORY_CARE_PROVIDER_SITE_OTHER): Payer: BC Managed Care – PPO | Admitting: Obstetrics

## 2014-12-06 VITALS — BP 110/77 | HR 85 | Temp 98.0°F | Ht 64.0 in | Wt 243.0 lb

## 2014-12-06 DIAGNOSIS — IMO0002 Reserved for concepts with insufficient information to code with codable children: Secondary | ICD-10-CM

## 2014-12-06 DIAGNOSIS — R896 Abnormal cytological findings in specimens from other organs, systems and tissues: Secondary | ICD-10-CM

## 2014-12-06 NOTE — Progress Notes (Signed)
Patient ID: Felicia Pham, female   DOB: 07/15/1980, 34 y.o.   MRN: [id number]  Chief Complaint  Patient presents with  . Advice Only    consult for colposcopy    HPI Felicia Pham is a 34 y.o. female.  LGSIL on pap smear.  Had colposcopy last year which agreed with pap.  Pap smear this year post colpo is LGSIL.  HPI  History reviewed. No pertinent past medical history.  Past Surgical History  Procedure Laterality Date  . Myomectomy      Family History  Problem Relation Age of Onset  . Lupus Mother   . Hypertension Mother   . Hypertension Father   . Diabetes Father     Social History Social History  Substance Use Topics  . Smoking status: Never Smoker   . Smokeless tobacco: Never Used  . Alcohol Use: No    No Known Allergies  Current Outpatient Prescriptions  Medication Sig Dispense Refill  . ibuprofen (ADVIL,MOTRIN) 200 MG tablet Take 600-800 mg by mouth every 6 (six) hours as needed. For pain     . metroNIDAZOLE (FLAGYL) 500 MG tablet Take 1 tablet (500 mg total) by mouth 2 (two) times daily. 14 tablet 0  . Norgestimate-Ethinyl Estradiol Triphasic (TRI-SPRINTEC) 0.18/0.215/0.25 MG-35 MCG tablet Take 1 tablet by mouth daily. 1 Package 11  . fluconazole (DIFLUCAN) 150 MG tablet Take 1 tablet (150 mg total) by mouth every other day. (Patient not taking: Reported on 11/14/2014) 2 tablet 0  . norethindrone (MICRONOR,CAMILA,ERRIN) 0.35 MG tablet Take 1 tablet (0.35 mg total) by mouth daily. (Patient not taking: Reported on 12/06/2014) 1 Package 11   No current facility-administered medications for this visit.    Review of Systems Review of Systems Constitutional: negative for fatigue and weight loss Respiratory: negative for cough and wheezing Cardiovascular: negative for chest pain, fatigue and palpitations Gastrointestinal: negative for abdominal pain and change in bowel habits Genitourinary:negative Integument/breast: negative for nipple  discharge Musculoskeletal:negative for myalgias Neurological: negative for gait problems and tremors Behavioral/Psych: negative for abusive relationship, depression Endocrine: negative for temperature intolerance     Blood pressure 110/77, pulse 85, temperature 98 F (36.7 C), height 5\' 4"  (1.626 m), weight 243 lb (110.224 kg), last menstrual period 11/19/2014.  Physical Exam Physical Exam: Deferred  100% of 10 min visit spent on counseling and coordination of care.   Data Reviewed Pap smear Pathology from Colposcopy  Assessment     LGSIL     Plan    Repeat pap 1 year   No orders of the defined types were placed in this encounter.   No orders of the defined types were placed in this encounter.

## 2014-12-25 ENCOUNTER — Telehealth: Payer: Self-pay | Admitting: *Deleted

## 2014-12-25 NOTE — Telephone Encounter (Signed)
Patient is calling about the side effects of the new birth control pills. 11:57 Call to patient- patient state she had more cramping and heavier flow on the new pill and she wants to switch back to the POP. She said that Rachelle told her she could switch back if she did not like the new pill.

## 2014-12-26 ENCOUNTER — Other Ambulatory Visit: Payer: Self-pay | Admitting: Certified Nurse Midwife

## 2014-12-26 DIAGNOSIS — Z3041 Encounter for surveillance of contraceptive pills: Secondary | ICD-10-CM

## 2014-12-26 MED ORDER — NORETHINDRONE 0.35 MG PO TABS: 1.0000 | ORAL_TABLET | Freq: Every day | ORAL | Status: DC

## 2014-12-26 NOTE — Telephone Encounter (Signed)
Yes, Micronor sent to the pharmacy for her to take.  She needs to take the same hour each day.  Thank you.  Stark Jock CNM

## 2014-12-26 NOTE — Telephone Encounter (Signed)
Patient notified

## 2015-01-22 ENCOUNTER — Ambulatory Visit
Admission: RE | Admit: 2015-01-22 | Discharge: 2015-01-22 | Disposition: A | Discharge status: Home / Self Care | Payer: BC Managed Care – PPO | Source: Ambulatory Visit | Attending: Endocrinology | Admitting: Endocrinology

## 2015-01-22 DIAGNOSIS — E041 Nontoxic single thyroid nodule: Secondary | ICD-10-CM

## 2015-01-25 ENCOUNTER — Other Ambulatory Visit: Payer: BC Managed Care – PPO

## 2015-07-05 ENCOUNTER — Other Ambulatory Visit (HOSPITAL_COMMUNITY)
Admission: RE | Admit: 2015-07-05 | Discharge: 2015-07-05 | Disposition: A | Discharge status: Home / Self Care | Payer: BC Managed Care – PPO | Source: Ambulatory Visit | Attending: Family Medicine | Admitting: Family Medicine

## 2015-07-05 ENCOUNTER — Emergency Department (INDEPENDENT_AMBULATORY_CARE_PROVIDER_SITE_OTHER)
Admission: EM | Admit: 2015-07-05 | Discharge: 2015-07-05 | Disposition: A | Discharge status: Home / Self Care | Payer: BC Managed Care – PPO | Source: Home / Self Care | Attending: Emergency Medicine | Admitting: Emergency Medicine

## 2015-07-05 ENCOUNTER — Encounter (HOSPITAL_COMMUNITY): Payer: Self-pay | Admitting: Emergency Medicine

## 2015-07-05 DIAGNOSIS — J069 Acute upper respiratory infection, unspecified: Secondary | ICD-10-CM | POA: Insufficient documentation

## 2015-07-05 LAB — POCT RAPID STREP A: Streptococcus, Group A Screen (Direct): NEGATIVE

## 2015-07-05 NOTE — ED Notes (Signed)
C/o cold sx onset 3/18 associated w/nasal/chest congestion, bilateral ears clogged, ST, dry cough Denies fevers Taking OTC cold meds w/no relief Reports she was seen on Monday by PCP; given cough syrup w/codeine and mucinex A&O x4... No acute distress.

## 2015-07-05 NOTE — ED Provider Notes (Signed)
CSN: [id number]     Arrival date & time 07/05/15  1259 History   First MD Initiated Contact with Patient 07/05/15 1335     Chief Complaint  Patient presents with  . URI   (Consider location/radiation/quality/duration/timing/severity/associated sxs/prior Treatment) HPI Comments: 35 year old female who is requesting to be seen as a follow-up after seeing her PCP for URI symptoms. Symptoms started approximate 5 days ago. She was seen by her PCP in the last couple of days and was treated with guaifenesin and codeine as well as a common nation medicine with phenylephrine, chlorpheniramine and acetaminophen. She is taking medication that states she still feels bad. Denies fevers or chills. No GI symptoms. No shortness of breath or substantial cough or fever.   History reviewed. No pertinent past medical history. Past Surgical History  Procedure Laterality Date  . Myomectomy     Family History  Problem Relation Age of Onset  . Lupus Mother   . Hypertension Mother   . Hypertension Father   . Diabetes Father    Social History  Substance Use Topics  . Smoking status: Never Smoker   . Smokeless tobacco: Never Used  . Alcohol Use: No   OB History    Gravida Para Term Preterm AB TAB SAB Ectopic Multiple Living   0 0 0 0 0 0 0 0 0 0      Review of Systems  Constitutional: Positive for activity change and fatigue. Negative for fever, chills and appetite change.  HENT: Positive for congestion, postnasal drip, rhinorrhea and sore throat. Negative for facial swelling.   Eyes: Negative.   Respiratory: Positive for cough. Negative for shortness of breath.   Cardiovascular: Negative.   Gastrointestinal: Negative.   Genitourinary: Negative.   Musculoskeletal: Negative for back pain, neck pain and neck stiffness.  Skin: Negative for pallor and rash.  Neurological: Negative.     Allergies  Review of patient's allergies indicates no known allergies.  Home Medications   Prior to Admission  medications   Medication Sig Start Date End Date Taking? Authorizing Provider  Norgestimate-Ethinyl Estradiol Triphasic (TRI-SPRINTEC) 0.18/0.215/0.25 MG-35 MCG tablet Take 1 tablet by mouth daily. 11/14/14  Yes Rachelle A Denney, CNM  fluconazole (DIFLUCAN) 150 MG tablet Take 1 tablet (150 mg total) by mouth every other day. Patient not taking: Reported on 11/14/2014 03/20/14   Lahoma Crocker, MD  ibuprofen (ADVIL,MOTRIN) 200 MG tablet Take 600-800 mg by mouth every 6 (six) hours as needed. For pain     Historical Provider, MD  metroNIDAZOLE (FLAGYL) 500 MG tablet Take 1 tablet (500 mg total) by mouth 2 (two) times daily. 11/17/14   Rachelle A Denney, CNM  norethindrone (MICRONOR,CAMILA,ERRIN) 0.35 MG tablet Take 1 tablet (0.35 mg total) by mouth daily. 12/26/14   Morene Crocker, CNM   Meds Ordered and Administered this Visit  Medications - No data to display  BP 137/80 mmHg  Pulse 120  Temp(Src) 98.3 F (36.8 C) (Oral)  Resp 18  SpO2 98%  LMP 07/05/2015 No data found.   Physical Exam  Constitutional: She is oriented to person, place, and time. She appears well-developed and well-nourished. No distress.  HENT:  Mouth/Throat: No oropharyngeal exudate.  Bilateral TMs with minor retraction otherwise normal appearing Oropharynx with clear PND and minor erythema.  Eyes: Conjunctivae and EOM are normal.  Neck: Normal range of motion. Neck supple.  Cardiovascular: Normal rate, regular rhythm and normal heart sounds.   Pulmonary/Chest: Effort normal and breath sounds normal. No respiratory distress. She  has no wheezes. She has no rales.  Abdominal: Soft. There is no tenderness.  Musculoskeletal: Normal range of motion. She exhibits no edema.  Lymphadenopathy:    She has no cervical adenopathy.  Neurological: She is alert and oriented to person, place, and time. She exhibits normal muscle tone.  Skin: Skin is warm and dry. No rash noted.  Psychiatric: She has a normal mood and affect.   Nursing note and vitals reviewed.   ED Course  Procedures (including critical care time)  Labs Review Labs Reviewed  POCT RAPID STREP A   Results for orders placed or performed during the hospital encounter of 07/05/15  POCT rapid strep A [facility])  Result Value Ref Range   Streptococcus, Group A Screen (Direct) NEGATIVE NEGATIVE     Imaging Review No results found.   Visual Acuity Review  Right Eye Distance:   Left Eye Distance:   Bilateral Distance:    Right Eye Near:   Left Eye Near:    Bilateral Near:         MDM   1. URI (upper respiratory infection)    Continue taking the medication given to you by your primary care doctor. Drink plenty fluids and stay well-hydrated. May add ibuprofen for aches and pains. If he develops high fevers, increased cough and shortness of breath sick medical attention promptly.     Janne Napoleon, NP 07/05/15 1416

## 2015-07-05 NOTE — Discharge Instructions (Signed)
Upper Respiratory Infection, Adult Continue taking the medication given to you by your primary care doctor. Drink plenty fluids and stay well-hydrated. May add ibuprofen for aches and pains. If he develops high fevers, increased cough and shortness of breath sick medical attention promptly. Most upper respiratory infections (URIs) are a viral infection of the air passages leading to the lungs. A URI affects the nose, throat, and upper air passages. The most common type of URI is nasopharyngitis and is typically referred to as "the common cold." URIs run their course and usually go away on their own. Most of the time, a URI does not require medical attention, but sometimes a bacterial infection in the upper airways can follow a viral infection. This is called a secondary infection. Sinus and middle ear infections are common types of secondary upper respiratory infections. Bacterial pneumonia can also complicate a URI. A URI can worsen asthma and chronic obstructive pulmonary disease (COPD). Sometimes, these complications can require emergency medical care and may be life threatening.  CAUSES Almost all URIs are caused by viruses. A virus is a type of germ and can spread from one person to another.  RISKS FACTORS You may be at risk for a URI if:   You smoke.   You have chronic heart or lung disease.  You have a weakened defense (immune) system.   You are very young or very old.   You have nasal allergies or asthma.  You work in crowded or poorly ventilated areas.  You work in health care facilities or schools. SIGNS AND SYMPTOMS  Symptoms typically develop 2-3 days after you come in contact with a cold virus. Most viral URIs last 7-10 days. However, viral URIs from the influenza virus (flu virus) can last 14-18 days and are typically more severe. Symptoms may include:   Runny or stuffy (congested) nose.   Sneezing.   Cough.   Sore throat.   Headache.   Fatigue.   Fever.    Loss of appetite.   Pain in your forehead, behind your eyes, and over your cheekbones (sinus pain).  Muscle aches.  DIAGNOSIS  Your health care provider may diagnose a URI by:  Physical exam.  Tests to check that your symptoms are not due to another condition such as:  Strep throat.  Sinusitis.  Pneumonia.  Asthma. TREATMENT  A URI goes away on its own with time. It cannot be cured with medicines, but medicines may be prescribed or recommended to relieve symptoms. Medicines may help:  Reduce your fever.  Reduce your cough.  Relieve nasal congestion. HOME CARE INSTRUCTIONS   Take medicines only as directed by your health care provider.   Gargle warm saltwater or take cough drops to comfort your throat as directed by your health care provider.  Use a warm mist humidifier or inhale steam from a shower to increase air moisture. This may make it easier to breathe.  Drink enough fluid to keep your urine clear or pale yellow.   Eat soups and other clear broths and maintain good nutrition.   Rest as needed.   Return to work when your temperature has returned to normal or as your health care provider advises. You may need to stay home longer to avoid infecting others. You can also use a face mask and careful hand washing to prevent spread of the virus.  Increase the usage of your inhaler if you have asthma.   Do not use any tobacco products, including cigarettes, chewing tobacco, or  electronic cigarettes. If you need help quitting, ask your health care provider. PREVENTION  The best way to protect yourself from getting a cold is to practice good hygiene.   Avoid oral or hand contact with people with cold symptoms.   Wash your hands often if contact occurs.  There is no clear evidence that vitamin C, vitamin E, echinacea, or exercise reduces the chance of developing a cold. However, it is always recommended to get plenty of rest, exercise, and practice good  nutrition.  SEEK MEDICAL CARE IF:   You are getting worse rather than better.   Your symptoms are not controlled by medicine.   You have chills.  You have worsening shortness of breath.  You have brown or red mucus.  You have yellow or brown nasal discharge.  You have pain in your face, especially when you bend forward.  You have a fever.  You have swollen neck glands.  You have pain while swallowing.  You have white areas in the back of your throat. SEEK IMMEDIATE MEDICAL CARE IF:   You have severe or persistent:  Headache.  Ear pain.  Sinus pain.  Chest pain.  You have chronic lung disease and any of the following:  Wheezing.  Prolonged cough.  Coughing up blood.  A change in your usual mucus.  You have a stiff neck.  You have changes in your:  Vision.  Hearing.  Thinking.  Mood. MAKE SURE YOU:   Understand these instructions.  Will watch your condition.  Will get help right away if you are not doing well or get worse.   This information is not intended to replace advice given to you by your health care provider. Make sure you discuss any questions you have with your health care provider.   Document Released: 09/24/2000 Document Revised: 08/15/2014 Document Reviewed: 07/06/2013 Elsevier Interactive Patient Education [employer].

## 2015-07-08 LAB — CULTURE, GROUP A STREP (THRC)

## 2015-09-07 ENCOUNTER — Ambulatory Visit (HOSPITAL_COMMUNITY)
Admission: RE | Admit: 2015-09-07 | Discharge: 2015-09-07 | Disposition: A | Discharge status: Home / Self Care | Payer: BC Managed Care – PPO | Source: Ambulatory Visit | Attending: Internal Medicine | Admitting: Internal Medicine

## 2015-09-07 ENCOUNTER — Other Ambulatory Visit (HOSPITAL_COMMUNITY): Payer: Self-pay | Admitting: Nurse Practitioner

## 2015-09-07 DIAGNOSIS — M7989 Other specified soft tissue disorders: Secondary | ICD-10-CM

## 2015-09-07 DIAGNOSIS — M79662 Pain in left lower leg: Secondary | ICD-10-CM

## 2015-09-07 DIAGNOSIS — M79605 Pain in left leg: Secondary | ICD-10-CM | POA: Diagnosis not present

## 2015-09-07 NOTE — Progress Notes (Signed)
*  PRELIMINARY RESULTS* Vascular Ultrasound Left lower extremity venous duplex has been completed.  Preliminary findings: No evidence of DVT or baker's cyst.   Attempted call report to Darleen Crocker. Left voice message with results .   Landry Mellow, RDMS, RVT  09/07/2015, 3:06 PM

## 2015-09-25 ENCOUNTER — Encounter: Payer: Self-pay | Admitting: Dietician

## 2015-09-25 ENCOUNTER — Encounter: Payer: BC Managed Care – PPO | Attending: Internal Medicine | Admitting: Dietician

## 2015-09-25 VITALS — Ht 65.0 in | Wt 249.5 lb

## 2015-09-25 DIAGNOSIS — Z713 Dietary counseling and surveillance: Secondary | ICD-10-CM | POA: Diagnosis not present

## 2015-09-25 DIAGNOSIS — E669 Obesity, unspecified: Secondary | ICD-10-CM

## 2015-09-25 NOTE — Patient Instructions (Addendum)
-  Work on drinking more water  -Presenter, broadcasting available rather than sodas and juice  -Practice drinking more water with meals  -Continue having a Premier protein shake instead of skipping a meal  -Keep these on hand at work and home  -[employer] regular meals and avoid skipping meals  -Start paying attention the serving sizes on the food label -Measure out portions to get a visual  -Limit fast food  -Start prepping meals in advance  -Always take a list to the grocery store  -When you cook make enough for leftovers  -Increase exercise  -Find something that you enjoy!

## 2015-09-25 NOTE — Progress Notes (Signed)
  Medical Nutrition Therapy:  Appt start time: 345 end time:  420   Assessment:  Primary concerns today: Felicia Pham is here today to discuss an overall healthy diet and lifestyle. She is taking Victoza for blood sugar (HgbA1c 5.8%) and weight management but has not been diagnosed with diabetes. She states that Victoza has helped her lose weight in the past. She notes that Victoza curbs her appetite. 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 She reports that her job is sedentary. She feels like she does not eat much but may eat the wrong things (fast food and sweets) in large portions.  Preferred Learning Style:   No preference indicated   Learning Readiness:   Ready  MEDICATIONS: victoza   DIETARY INTAKE:  Frequently skips meals. Avoided foods include cucumbers, Brussels sprouts, liver.    24-hr recall:  B ( AM): breakfast sandwich or boiled eggs and oatmeal, may skip if she is not working Snk ( AM):   L ( PM): pizza or wings ordered out Snk ( PM):  D ( PM): Premier protein shake or fast food, sometimes skips Snk ( PM): sometimes something sweet   Beverages: water, Gatorade, juice, sometimes soda  Usual physical activity: "not much"  Estimated energy needs: 1600-1800 calories 180-200 g carbohydrates 120-135 g protein 44-50 g fat  Progress Towards Goal(s):  In progress.   Nutritional Diagnosis:  Campo-3.3 Overweight/obesity As related to erratic meal pattern, inappropriate food choices, and physical inactivity.  As evidenced by BMI 41.6.    Intervention:  Nutrition counseling provided. -Work on drinking more water  -Presenter, broadcasting available rather than sodas and juice  -Practice drinking more water with meals -Continue having a Premier protein shake instead of skipping a meal  -Keep these on hand at work and home -[employer] regular meals and avoid skipping meals -Start paying attention the serving sizes on the food label -Measure out portions to  get a visual -Limit fast food  -Start prepping meals in advance  -Always take a list to the grocery store  -When you cook make enough for leftovers -Increase exercise  -Find something that you enjoy!  Teaching Method Utilized:  Visual Auditory Hands on  Handouts given during visit include:  MyPlate  Meal planning card  Barriers to learning/adherence to lifestyle change: none  Demonstrated degree of understanding via:  Teach Back   Monitoring/Evaluation:  Dietary intake, exercise, and body weight prn.

## 2015-09-26 ENCOUNTER — Encounter: Payer: Self-pay | Admitting: Dietician

## 2015-09-29 ENCOUNTER — Other Ambulatory Visit: Payer: Self-pay | Admitting: Endocrinology

## 2015-09-29 DIAGNOSIS — E041 Nontoxic single thyroid nodule: Secondary | ICD-10-CM

## 2015-10-03 ENCOUNTER — Encounter: Payer: Self-pay | Admitting: Physician Assistant

## 2015-10-10 ENCOUNTER — Ambulatory Visit
Admission: RE | Admit: 2015-10-10 | Discharge: 2015-10-10 | Disposition: A | Discharge status: Home / Self Care | Payer: BC Managed Care – PPO | Source: Ambulatory Visit | Attending: Internal Medicine | Admitting: Internal Medicine

## 2015-10-10 ENCOUNTER — Other Ambulatory Visit: Payer: Self-pay | Admitting: Nurse Practitioner

## 2015-10-10 ENCOUNTER — Ambulatory Visit
Admission: RE | Admit: 2015-10-10 | Discharge: 2015-10-10 | Disposition: A | Discharge status: Home / Self Care | Payer: BC Managed Care – PPO | Source: Ambulatory Visit | Attending: Endocrinology | Admitting: Endocrinology

## 2015-10-10 DIAGNOSIS — R52 Pain, unspecified: Secondary | ICD-10-CM

## 2015-10-10 DIAGNOSIS — E041 Nontoxic single thyroid nodule: Secondary | ICD-10-CM

## 2015-12-11 ENCOUNTER — Ambulatory Visit: Payer: BC Managed Care – PPO | Admitting: Obstetrics

## 2015-12-28 ENCOUNTER — Ambulatory Visit: Payer: BC Managed Care – PPO | Admitting: Obstetrics

## 2016-01-16 ENCOUNTER — Ambulatory Visit (INDEPENDENT_AMBULATORY_CARE_PROVIDER_SITE_OTHER): Payer: BC Managed Care – PPO | Admitting: Obstetrics

## 2016-01-16 ENCOUNTER — Encounter: Payer: Self-pay | Admitting: Obstetrics

## 2016-01-16 ENCOUNTER — Encounter: Payer: Self-pay | Admitting: *Deleted

## 2016-01-16 VITALS — BP 107/75 | HR 88 | Ht 64.0 in

## 2016-01-16 DIAGNOSIS — Z3041 Encounter for surveillance of contraceptive pills: Secondary | ICD-10-CM | POA: Diagnosis not present

## 2016-01-16 DIAGNOSIS — Z124 Encounter for screening for malignant neoplasm of cervix: Secondary | ICD-10-CM

## 2016-01-16 DIAGNOSIS — Z113 Encounter for screening for infections with a predominantly sexual mode of transmission: Secondary | ICD-10-CM

## 2016-01-16 DIAGNOSIS — Z01419 Encounter for gynecological examination (general) (routine) without abnormal findings: Secondary | ICD-10-CM

## 2016-01-16 NOTE — Progress Notes (Signed)
Subjective:        TAHIA MACNEAL is a 35 y.o. female here for a routine exam.  Current complaints: None.    Personal health questionnaire:  Is patient [name], have a family history of breast and/or ovarian cancer: no Is there a family history of uterine cancer diagnosed at age < 32, gastrointestinal cancer, urinary tract cancer, family member who is a Field seismologist syndrome-associated carrier: no Is the patient overweight and hypertensive, family history of diabetes, personal history of gestational diabetes, preeclampsia or PCOS: no Is patient over 2, have PCOS,  family history of premature CHD under age 54, diabetes, smoke, have hypertension or peripheral artery disease:  no At any time, has a partner hit, kicked or otherwise hurt or frightened you?: no Over the past 2 weeks, have you felt down, depressed or hopeless?: no Over the past 2 weeks, have you felt little interest or pleasure in doing things?:no   Gynecologic History Patient's last menstrual period was 12/30/2015. Contraception: unknown Last Pap: 2016. Results were: normal Last mammogram: n/a. Results were: n/a  Obstetric History OB History  Gravida Para Term Preterm AB Living  0 0 0 0 0 0  SAB TAB Ectopic Multiple Live Births  0 0 0 0          History reviewed. No pertinent past medical history.  Past Surgical History:  Procedure Laterality Date  . MYOMECTOMY       Current Outpatient Prescriptions:  .  Liraglutide (VICTOZA [place]), Inject into the skin., Disp: , Rfl:  .  norethindrone (MICRONOR,CAMILA,ERRIN) 0.35 MG tablet, Take 1 tablet (0.35 mg total) by mouth daily., Disp: 1 Package, Rfl: 11 .  ibuprofen (ADVIL,MOTRIN) 200 MG tablet, Take 600-800 mg by mouth every 6 (six) hours as needed. Reported on 09/25/2015, Disp: , Rfl:  No Known Allergies  Social History  Substance Use Topics  . Smoking status: Never Smoker  . Smokeless tobacco: Never Used  . Alcohol use No    Family History  Problem  Relation Age of Onset  . Lupus Mother   . Hypertension Mother   . Hypertension Father   . Diabetes Father   . Diabetes Maternal Grandmother   . Diabetes Paternal Grandmother       Review of Systems  Constitutional: negative for fatigue and weight loss Respiratory: negative for cough and wheezing Cardiovascular: negative for chest pain, fatigue and palpitations Gastrointestinal: negative for abdominal pain and change in bowel habits Musculoskeletal:negative for myalgias Neurological: negative for gait problems and tremors Behavioral/Psych: negative for abusive relationship, depression Endocrine: negative for temperature intolerance   Genitourinary:negative for abnormal menstrual periods, genital lesions, hot flashes, sexual problems and vaginal discharge Integument/breast: negative for breast lump, breast tenderness, nipple discharge and skin lesion(s)    Objective:       BP 107/75   Pulse 88   Ht 5\' 4"  (1.626 m)   LMP 12/30/2015  General:   alert  Skin:   no rash or abnormalities  Lungs:   clear to auscultation bilaterally  Heart:   regular rate and rhythm, S1, S2 normal, no murmur, click, rub or gallop  Breasts:   normal without suspicious masses, skin or nipple changes or axillary nodes  Abdomen:  normal findings: no organomegaly, soft, non-tender and no hernia  Pelvis:  External genitalia: normal general appearance Urinary system: urethral meatus normal and bladder without fullness, nontender Vaginal: normal without tenderness, induration or masses Cervix: normal appearance Adnexa: normal bimanual exam Uterus: anteverted and non-tender,  normal size   Lab Review Urine pregnancy test Labs reviewed yes Radiologic studies reviewed no  50% of 20 min visit spent on counseling and coordination of care.   Assessment:    Healthy female exam.    Plan:    Education reviewed: calcium supplements, depression evaluation, low fat, low cholesterol diet, safe sex/STD  prevention, self breast exams and weight bearing exercise. Contraception: oral progesterone-only contraceptive. Follow up in: 1 year.   No orders of the defined types were placed in this encounter.  Orders Placed This Encounter  Procedures  . HIV antibody  . Hepatitis B surface antigen  . RPR  . Hepatitis C antibody     Patient ID: JAMICE RYKER, female   DOB: 1981-04-12, 35 y.o.   MRN: [id number]

## 2016-01-17 LAB — RPR: RPR Ser Ql: NONREACTIVE

## 2016-01-17 LAB — HIV ANTIBODY (ROUTINE TESTING W REFLEX): HIV Screen 4th Generation wRfx: NONREACTIVE

## 2016-01-17 LAB — GC/CHLAMYDIA PROBE AMP (~~LOC~~) NOT AT ARMC
Chlamydia: NEGATIVE
Neisseria Gonorrhea: NEGATIVE

## 2016-01-17 LAB — HEPATITIS C ANTIBODY: Hep C Virus Ab: 0.1 s/co ratio (ref 0.0–0.9)

## 2016-01-17 LAB — HEPATITIS B SURFACE ANTIGEN: Hepatitis B Surface Ag: NEGATIVE

## 2016-01-17 LAB — CYTOLOGY - PAP

## 2016-03-26 ENCOUNTER — Other Ambulatory Visit: Payer: Self-pay | Admitting: Certified Nurse Midwife

## 2016-03-26 DIAGNOSIS — Z3041 Encounter for surveillance of contraceptive pills: Secondary | ICD-10-CM

## 2016-03-27 ENCOUNTER — Other Ambulatory Visit: Payer: Self-pay | Admitting: Obstetrics

## 2016-03-27 DIAGNOSIS — R87612 Low grade squamous intraepithelial lesion on cytologic smear of cervix (LGSIL): Secondary | ICD-10-CM

## 2016-04-02 ENCOUNTER — Other Ambulatory Visit: Payer: Self-pay | Admitting: Certified Nurse Midwife

## 2016-04-02 DIAGNOSIS — Z3041 Encounter for surveillance of contraceptive pills: Secondary | ICD-10-CM

## 2016-11-05 IMAGING — CR DG KNEE COMPLETE 4+V*L*
1 series · 1 of 1 positions shown · non-contrast
Comparison: None.

CLINICAL DATA: Knee pain 1 month

EXAM:
LEFT KNEE - COMPLETE 4+ VIEW

[view not recorded]
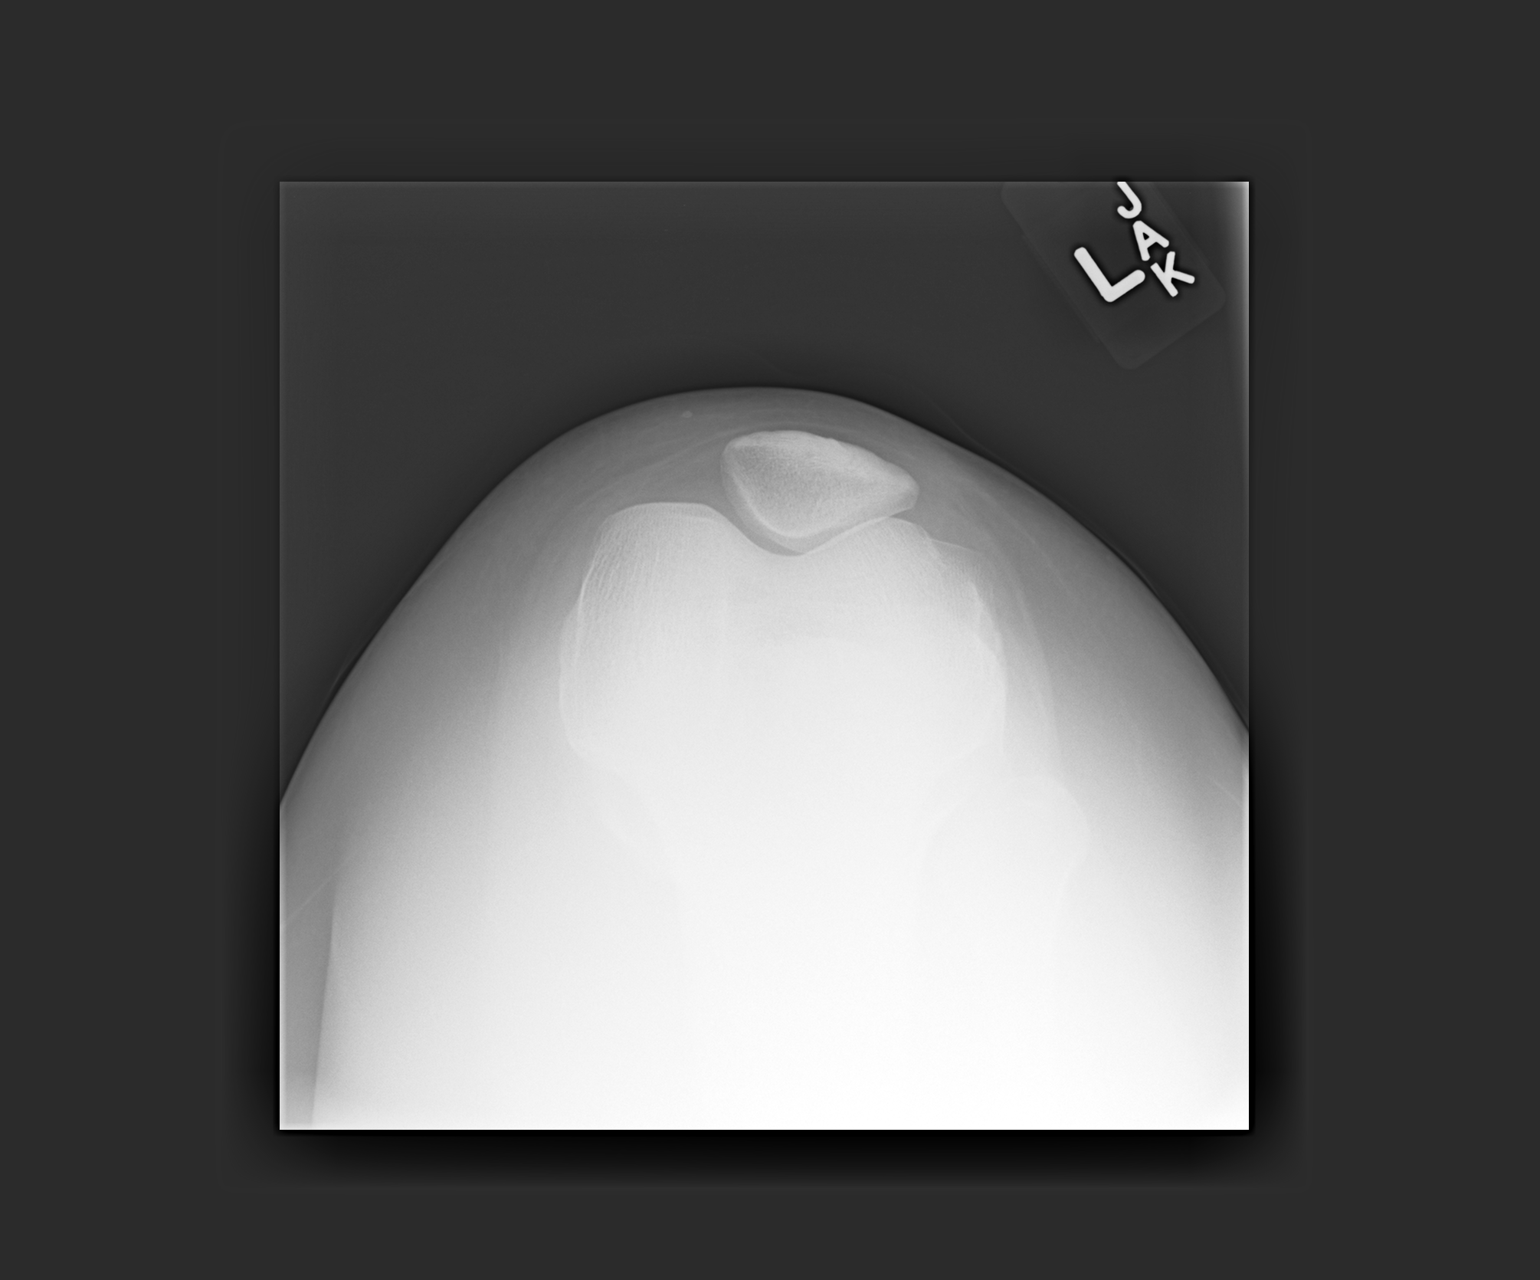

[1 of 1 positions shown; findings below may reference images not displayed]

FINDINGS: No evidence of fracture, dislocation, or joint effusion. No evidence
of arthropathy or other focal bone abnormality. Soft tissues are
unremarkable.
IMPRESSION: Negative.

## 2016-12-28 ENCOUNTER — Ambulatory Visit (HOSPITAL_COMMUNITY)
Admission: EM | Admit: 2016-12-28 | Discharge: 2016-12-28 | Disposition: A | Discharge status: Home / Self Care | Payer: BC Managed Care – PPO | Attending: Family Medicine | Admitting: Family Medicine

## 2016-12-28 ENCOUNTER — Encounter (HOSPITAL_COMMUNITY): Payer: Self-pay | Admitting: Emergency Medicine

## 2016-12-28 DIAGNOSIS — J029 Acute pharyngitis, unspecified: Secondary | ICD-10-CM | POA: Diagnosis not present

## 2016-12-28 DIAGNOSIS — J0140 Acute pansinusitis, unspecified: Secondary | ICD-10-CM

## 2016-12-28 DIAGNOSIS — J069 Acute upper respiratory infection, unspecified: Secondary | ICD-10-CM | POA: Diagnosis present

## 2016-12-28 LAB — POCT RAPID STREP A: Streptococcus, Group A Screen (Direct): NEGATIVE

## 2016-12-28 MED ORDER — CETIRIZINE-PSEUDOEPHEDRINE ER 5-120 MG PO TB12: 1.0000 | ORAL_TABLET | Freq: Every day | ORAL | 0 refills | Status: DC

## 2016-12-28 MED ORDER — AMOXICILLIN-POT CLAVULANATE 875-125 MG PO TABS: 1.0000 | ORAL_TABLET | Freq: Two times a day (BID) | ORAL | 0 refills | Status: DC

## 2016-12-28 MED ORDER — IPRATROPIUM BROMIDE 0.06 % NA SOLN
2.0000 | Freq: Four times a day (QID) | NASAL | 0 refills | Status: DC
Start: 2016-12-28 — End: 2017-11-05

## 2016-12-28 NOTE — ED Provider Notes (Signed)
[facility]    CSN: [id number] Arrival date & time: 12/28/16  1202     History   Chief Complaint Chief Complaint  Patient presents with  . URI    HPI Felicia Pham is a 36 y.o. female.   36 year old female comes in for 6 day history of sore throat, congestion, sinus pressure, ear pain. Productive cough, worse at night. Denies fever, chills, night sweats. Denies abdominal pain, nausea, vomiting, diarrhea. Denies chest pain, shortness of breath, wheezing. She has been using over-the-counter cold medication with temporary relief, which includes musinex, dayquil, flonase. No sick contact.      History reviewed. No pertinent past medical history.  Patient Active Problem List   Diagnosis Date Noted  . 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 Other and unspecified ovarian cyst 11/02/2012  . Leiomyoma of uterus, unspecified 08/05/2012    Past Surgical History:  Procedure Laterality Date  . MYOMECTOMY      OB History    Gravida Para Term Preterm AB Living   0 0 0 0 0 0   SAB TAB Ectopic Multiple Live Births   0 0 0 0         Home Medications    Prior to Admission medications   Medication Sig Start Date End Date Taking? Authorizing Provider  norethindrone (MICRONOR,CAMILA,ERRIN) 0.35 MG tablet TAKE ONE TABLET BY MOUTH DAILY 04/02/16  Yes Shelly Bombard, MD  amoxicillin-clavulanate (AUGMENTIN) 875-125 MG tablet Take 1 tablet by mouth every 12 (twelve) hours. 12/28/16   Tasia Catchings, Thermon Zulauf V, PA-C  cetirizine-pseudoephedrine (ZYRTEC-D) 5-120 MG tablet Take 1 tablet by mouth daily. 12/28/16   Tasia Catchings, Rosario Kushner V, PA-C  ibuprofen (ADVIL,MOTRIN) 200 MG tablet Take 600-800 mg by mouth every 6 (six) hours as needed. Reported on 09/25/2015    [provider]  ipratropium (ATROVENT) 0.06 % nasal spray Place 2 sprays into both nostrils 4 (four) times daily. 12/28/16   Tasia Catchings, Darrius Montano V, PA-C    Liraglutide (VICTOZA [place]) Inject into the skin.    [provider]    Family History Family History  Problem Relation Age of Onset  . Lupus Mother   . Hypertension Mother   . Hypertension Father   . Diabetes Father   . Diabetes Maternal Grandmother   . Diabetes Paternal Grandmother     Social History Social History  Substance Use Topics  . Smoking status: Never Smoker  . Smokeless tobacco: Never Used  . Alcohol use No     Allergies   Patient has no known allergies.   Review of Systems Review of Systems  Reason unable to perform ROS: See HPI as above.     Physical Exam Triage Vital Signs ED Triage Vitals [12/28/16 1235]  Enc Vitals Group     BP 111/77     Pulse Rate 84     Resp 18     Temp 98.8 F (37.1 C)     Temp Source Oral     SpO2 100 %     Weight      Height      Head Circumference      Peak Flow      Pain Score 7     Pain Loc      Pain Edu?      Excl. in [place]?    No data found.   Updated Vital Signs BP 111/77 (BP Location: Left  Arm)   Pulse 84   Temp 98.8 F (37.1 C) (Oral)   Resp 18   LMP 12/10/2016   SpO2 100%   Physical Exam  Constitutional: She is oriented to person, place, and time. She appears well-developed and well-nourished. No distress.  HENT:  Head: Normocephalic and atraumatic.  Right Ear: External ear and ear canal normal. Tympanic membrane is erythematous. Tympanic membrane is not bulging.  Left Ear: External ear and ear canal normal. Tympanic membrane is erythematous. Tympanic membrane is not bulging.  Nose: Right sinus exhibits maxillary sinus tenderness and frontal sinus tenderness. Left sinus exhibits maxillary sinus tenderness and frontal sinus tenderness.  Mouth/Throat: Uvula is midline, oropharynx is clear and moist and mucous membranes are normal. Tonsils are 2+ on the right. Tonsils are 2+ on the left. Tonsillar exudate (left).  Eyes: Pupils are equal, round, and reactive to light. Conjunctivae are normal.   Neck: Normal range of motion. Neck supple.  Cardiovascular: Normal rate, regular rhythm and normal heart sounds.  Exam reveals no gallop and no friction rub.   No murmur heard. Pulmonary/Chest: Effort normal and breath sounds normal. She has no decreased breath sounds. She has no wheezes. She has no rhonchi. She has no rales.  Lymphadenopathy:    She has no cervical adenopathy.  Neurological: She is alert and oriented to person, place, and time.  Skin: Skin is warm and dry.  Psychiatric: She has a normal mood and affect. Her behavior is normal. Judgment normal.     UC Treatments / Results  Labs (all labs ordered are listed, but only abnormal results are displayed) Labs Reviewed  CULTURE, GROUP A STREP [facility])  POCT RAPID STREP A    EKG  EKG Interpretation None       Radiology No results found.  Procedures Procedures (including critical care time)  Medications Ordered in UC Medications - No data to display   Initial Impression / Assessment and Plan / UC Course  I have reviewed the triage vital signs and the nursing notes.  Pertinent labs & imaging results that were available during my care of the patient were reviewed by me and considered in my medical decision making (see chart for details).    Rapid strep negative. Given patient has been using symptomatic treatment and symptoms for 6 days, discussed option for initiation of antibiotics. Will provide further symptomatic treatment, if patient symptoms do not improve/resolve in 3-5 days, to start augmentin as directed. Return precautions given. Patient expresses understanding and agrees to plan.    Final Clinical Impressions(s) / UC Diagnoses   Final diagnoses:  Acute non-recurrent pansinusitis    New Prescriptions New Prescriptions   AMOXICILLIN-CLAVULANATE (AUGMENTIN) 875-125 MG TABLET    Take 1 tablet by mouth every 12 (twelve) hours.   CETIRIZINE-PSEUDOEPHEDRINE (ZYRTEC-D) 5-120 MG TABLET    Take 1 tablet by  mouth daily.   IPRATROPIUM (ATROVENT) 0.06 % NASAL SPRAY    Place 2 sprays into both nostrils 4 (four) times daily.      Ok Edwards, PA-C 12/28/16 1340

## 2016-12-28 NOTE — ED Triage Notes (Signed)
Pt c/o cold sx onset: 6 days  Sx include: bilateral ear pain, ST, nasal congestion, prod cough  Denies: fevers, chills   Taking: OTC cold meds w/temp relief.   Needing note for work   A&O x4... NAD

## 2016-12-28 NOTE — Discharge Instructions (Signed)
Rapid strep negative. Start atrovent nasal spray and Zyrtec-D for nasal congestion. You can use over the counter nasal saline rinse such as neti pot for nasal congestion. Keep hydrated, your urine should be clear to pale yellow in color. Tylenol/motrin for fever and pain. If symptoms not improving in the next 3-5 days, start Augmentin as directed. Monitor for any worsening of symptoms, chest pain, shortness of breath, wheezing, swelling of the throat, follow up for reevaluation.

## 2016-12-30 LAB — CULTURE, GROUP A STREP (THRC)

## 2017-02-19 ENCOUNTER — Encounter: Payer: Self-pay | Admitting: Obstetrics

## 2017-02-19 ENCOUNTER — Ambulatory Visit (INDEPENDENT_AMBULATORY_CARE_PROVIDER_SITE_OTHER): Payer: BC Managed Care – PPO | Admitting: Obstetrics

## 2017-02-19 VITALS — BP 133/85 | HR 94 | Ht 64.0 in | Wt 271.3 lb

## 2017-02-19 DIAGNOSIS — Z113 Encounter for screening for infections with a predominantly sexual mode of transmission: Secondary | ICD-10-CM

## 2017-02-19 DIAGNOSIS — Z1151 Encounter for screening for human papillomavirus (HPV): Secondary | ICD-10-CM

## 2017-02-19 DIAGNOSIS — Z01419 Encounter for gynecological examination (general) (routine) without abnormal findings: Secondary | ICD-10-CM

## 2017-02-19 DIAGNOSIS — Z3041 Encounter for surveillance of contraceptive pills: Secondary | ICD-10-CM

## 2017-02-19 DIAGNOSIS — Z124 Encounter for screening for malignant neoplasm of cervix: Secondary | ICD-10-CM | POA: Diagnosis not present

## 2017-02-19 NOTE — Progress Notes (Signed)
Presents for AEX, wants PAP/STD.

## 2017-02-19 NOTE — Progress Notes (Signed)
Subjective:        Felicia Pham is a 36 y.o. female here for a routine exam.  Current complaints: None.    Personal health questionnaire:  Is patient [name], have a family history of breast and/or ovarian cancer: no Is there a family history of uterine cancer diagnosed at age < 31, gastrointestinal cancer, urinary tract cancer, family member who is a Field seismologist syndrome-associated carrier: no Is the patient overweight and hypertensive, family history of diabetes, personal history of gestational diabetes, preeclampsia or PCOS: no Is patient over 54, have PCOS,  family history of premature CHD under age 67, diabetes, smoke, have hypertension or peripheral artery disease:  no At any time, has a partner hit, kicked or otherwise hurt or frightened you?: no Over the past 2 weeks, have you felt down, depressed or hopeless?: no Over the past 2 weeks, have you felt little interest or pleasure in doing things?:no   Gynecologic History Patient's last menstrual period was 01/24/2017. Contraception: oral progesterone-only contraceptive Last Pap: 2017. Results were: normal Last mammogram: n/a. Results were: n/a  Obstetric History OB History  Gravida Para Term Preterm AB Living  0 0 0 0 0 0  SAB TAB Ectopic Multiple Live Births  0 0 0 0          History reviewed. No pertinent past medical history.  Past Surgical History:  Procedure Laterality Date  . MYOMECTOMY       Current Outpatient Medications:  .  ibuprofen (ADVIL,MOTRIN) 200 MG tablet, Take 600-800 mg by mouth every 6 (six) hours as needed. Reported on 09/25/2015, Disp: , Rfl:  .  norethindrone (MICRONOR,CAMILA,ERRIN) 0.35 MG tablet, TAKE ONE TABLET BY MOUTH DAILY, Disp: 28 tablet, Rfl: 11 .  amoxicillin-clavulanate (AUGMENTIN) 875-125 MG tablet, Take 1 tablet by mouth every 12 (twelve) hours. (Patient not taking: Reported on 02/19/2017), Disp: 14 tablet, Rfl: 0 .  cetirizine-pseudoephedrine (ZYRTEC-D) 5-120 MG tablet,  Take 1 tablet by mouth daily. (Patient not taking: Reported on 02/19/2017), Disp: 30 tablet, Rfl: 0 .  ipratropium (ATROVENT) 0.06 % nasal spray, Place 2 sprays into both nostrils 4 (four) times daily. (Patient not taking: Reported on 02/19/2017), Disp: 15 mL, Rfl: 0 .  Liraglutide (VICTOZA [facility]), Inject into the skin., Disp: , Rfl:  No Known Allergies  Social History   Tobacco Use  . Smoking status: Never Smoker  . Smokeless tobacco: Never Used  Substance Use Topics  . Alcohol use: No    Family History  Problem Relation Age of Onset  . Lupus Mother   . Hypertension Mother   . Hypertension Father   . Diabetes Father   . Diabetes Maternal Grandmother   . Diabetes Paternal Grandmother       Review of Systems  Constitutional: negative for fatigue and weight loss Respiratory: negative for cough and wheezing Cardiovascular: negative for chest pain, fatigue and palpitations Gastrointestinal: negative for abdominal pain and change in bowel habits Musculoskeletal:negative for myalgias Neurological: negative for gait problems and tremors Behavioral/Psych: negative for abusive relationship, depression Endocrine: negative for temperature intolerance    Genitourinary:negative for abnormal menstrual periods, genital lesions, hot flashes, sexual problems and vaginal discharge Integument/breast: negative for breast lump, breast tenderness, nipple discharge and skin lesion(s)    Objective:       BP 133/85   Pulse 94   Ht 5\' 4"  (1.626 m)   Wt 271 lb 4.8 oz (123.1 kg)   LMP 01/24/2017   BMI 46.57 kg/m  General:  alert  Skin:   no rash or abnormalities  Lungs:   clear to auscultation bilaterally  Heart:   regular rate and rhythm, S1, S2 normal, no murmur, click, rub or gallop  Breasts:   normal without suspicious masses, skin or nipple changes or axillary nodes  Abdomen:  normal findings: no organomegaly, soft, non-tender and no hernia  Pelvis:  External genitalia: normal general  appearance Urinary system: urethral meatus normal and bladder without fullness, nontender Vaginal: normal without tenderness, induration or masses Cervix: normal appearance Adnexa: normal bimanual exam Uterus: anteverted and non-tender, normal size   Lab Review Urine pregnancy test Labs reviewed yes Radiologic studies reviewed no  50% of 20 min visit spent on counseling and coordination of care.    Assessment:     1. Encounter for routine gynecological examination with Papanicolaou smear of cervix Rx: - Cytology - PAP - Cervicovaginal ancillary only  2. Encounter for surveillance of contraceptive pills - pleased with the pill   Plan:    Education reviewed: calcium supplements, depression evaluation, low fat, low cholesterol diet, safe sex/STD prevention, self breast exams, skin cancer screening and weight bearing exercise. Contraception: oral progesterone-only contraceptive. Follow up in: 1 year.   No orders of the defined types were placed in this encounter.  No orders of the defined types were placed in this encounter.

## 2017-02-20 LAB — CERVICOVAGINAL ANCILLARY ONLY
Bacterial vaginitis: NEGATIVE
Candida vaginitis: NEGATIVE
Chlamydia: NEGATIVE
Neisseria Gonorrhea: NEGATIVE
Trichomonas: NEGATIVE

## 2017-02-24 LAB — CYTOLOGY - PAP: HPV: NOT DETECTED

## 2017-03-19 ENCOUNTER — Encounter: Payer: Self-pay | Admitting: *Deleted

## 2017-03-26 ENCOUNTER — Telehealth: Payer: Self-pay

## 2017-03-26 NOTE — Telephone Encounter (Signed)
Rec'vd VM from pt. LVM for pt to c/b

## 2017-04-17 ENCOUNTER — Other Ambulatory Visit: Payer: Self-pay | Admitting: Obstetrics

## 2017-04-17 DIAGNOSIS — Z3041 Encounter for surveillance of contraceptive pills: Secondary | ICD-10-CM

## 2017-04-21 ENCOUNTER — Encounter: Payer: BC Managed Care – PPO | Admitting: Obstetrics

## 2017-04-28 ENCOUNTER — Encounter: Payer: BC Managed Care – PPO | Admitting: Obstetrics

## 2017-05-07 ENCOUNTER — Encounter: Payer: Self-pay | Admitting: Obstetrics

## 2017-05-07 ENCOUNTER — Ambulatory Visit (INDEPENDENT_AMBULATORY_CARE_PROVIDER_SITE_OTHER): Payer: BC Managed Care – PPO | Admitting: Obstetrics

## 2017-05-07 VITALS — BP 121/84 | HR 78 | Wt 271.9 lb

## 2017-05-07 DIAGNOSIS — R87612 Low grade squamous intraepithelial lesion on cytologic smear of cervix (LGSIL): Secondary | ICD-10-CM | POA: Diagnosis not present

## 2017-05-07 NOTE — Progress Notes (Signed)
Colposcopy Procedure Note  Indications: Pap smear 2 months ago showed: low-grade squamous intraepithelial neoplasia (LGSIL - encompassing HPV,mild dysplasia,CIN I). The prior pap showed low-grade squamous intraepithelial neoplasia (LGSIL - encompassing HPV,mild dysplasia,CIN I).  Prior cervical/vaginal disease: CIN 1. Prior cervical treatment: no treatment.  Procedure Details  The risks and benefits of the procedure and Written informed consent obtained.  A time-out was performed confirming the patient, procedure and allergy status  Speculum placed in vagina and excellent visualization of cervix achieved, cervix swabbed x 3 with acetic acid solution.  Findings: Cervix: no mosaicism, no punctation, no abnormal vasculature and acetowhite lesion(s) noted at 2 and 8 o'clock o'clock; SCJ visualized - lesion at 2 and 8 o'clock, endocervical curettage performed, cervical biopsies taken at 2 and 8 o'clock, specimen labelled and sent to pathology and hemostasis achieved with silver nitrate.   Vaginal inspection: normal without visible lesions. Vulvar colposcopy: vulvar colposcopy not performed.   Physical Exam   Specimens: ECC and Cervical Biopsies  Complications: none.  Plan: Specimens labelled and sent to Pathology. Will base further treatment on Pathology findings. Treatment options discussed with patient. Post biopsy instructions given to patient. Return to discuss Pathology results in 2 weeks.   Shelly Bombard MD

## 2017-05-07 NOTE — Patient Instructions (Addendum)
Colposcopy Colposcopy is a procedure to examine the lowest part of the uterus (cervix), the vagina, and the area around the vaginal opening (vulva) for abnormalities or signs of disease. The procedure is done using a lighted microscope or magnifying lens (colposcope). If any unusual cells are found during the procedure, your health care provider may remove a tissue sample for testing (biopsy). A colposcopy may be done if you:  Have an abnormal Pap test. A Pap test is a screening test that is used to check for signs of cancer or infection of the vagina, cervix, and uterus.  Have a Pap smear test in which you test positive for high-risk HPV (human papillomavirus).  Have a sore or lesion on your cervix.  Have genital warts on your vulva, vagina, or cervix.  Took certain medicines while pregnant, such as diethylstilbestrol (DES).  Have pain during sexual intercourse.  Have vaginal bleeding, especially after sexual intercourse.  Need to have a cervical polyp removed.  Need to have a lost intrauterine device (IUD) string located.  Let your health care provider know about:  Any allergies you have, including allergies to prescribed medicine, latex, or iodine.  All medicines you are taking, including vitamins, herbs, eye drops, creams, and over-the-counter medicines. Bring a list of all of your medicines to your appointment.  Any problems you or family members have had with anesthetic medicines.  Any blood disorders you have.  Any surgeries you have had.  Any medical conditions you have, such as pelvic inflammatory disease (PID) or endometrial disorder.  Any history of frequent fainting.  Your menstrual cycle and what form of birth control (contraception) you use.  Your medical history, including any prior cervical treatment.  Whether you are pregnant or may be pregnant. What are the risks? Generally, this is a safe procedure. However, problems may occur,  including:  Pain.  Infection, which may include a fever, bad-smelling discharge, or pelvic pain.  Bleeding or discharge.  Misdiagnosis.  Fainting and vasovagal reactions, but this is rare.  Allergic reactions to medicines.  Damage to other structures or organs.  What happens before the procedure?  If you have your menstrual period or will have it at the time of your procedure, tell your health care provider. A colposcopy typically is not done during menstruation.  Continue your contraceptive practices before and after the procedure.  For 24 hours before the colposcopy: ? Do not douche. ? Do not use tampons. ? Do not use medicines, creams, or suppositories in the vagina. ? Do not have sexual intercourse.  Ask your health care provider about: ? Changing or stopping your regular medicines. This is especially important if you are taking diabetes medicines or blood thinners. ? Taking medicines such as aspirin and ibuprofen. These medicines can thin your blood. Do not take these medicines before your procedure if your health care provider instructs you not to. It is likely that your health care provider will tell you to avoid taking aspirin or medicine that contains aspirin for 7 days before the procedure.  Follow instructions from your health care provider about eating or drinking restrictions. You will likely need to eat a regular diet the day of the procedure and not skip any meals.  You may have an exam or testing. A pregnancy test will be taken on the day of the procedure.  You may have a blood or urine sample taken.  Plan to have someone take you home from the hospital or clinic.  If you will  be going home right after the procedure, plan to have someone with you for 24 hours. What happens during the procedure?  You will lie down on your back, with your feet in foot rests (stirrups).  A warmed and lubricated instrument (speculum) will be inserted into your vagina. The  speculum will be used to hold apart the walls of your vagina so your health care provider can see your cervix and the inside of your vagina.  A cotton swab will be used to place a small amount of liquid solution on the areas to be examined. This solution makes it easier to see abnormal cells. You may feel a slight burning during this part.  The colposcope will be used to scan the cervix with a bright white light. The colposcope will be held near your vulvaand will magnify your vulva, vagina, and cervix for easier examination.  Your health care provider may decide to take a biopsy. If so: ? You may be given medicine to numb the area (local anesthetic). ? Surgical instruments will be used to suck out mucus and cells through your vagina. ? You may feel mild pain while the tissue sample is removed. ? Bleeding may occur. A solution may be used to stop the bleeding. ? If a sample of tissue is needed from the inside of the cervix, a different procedure called endocervical curettage (ECC) may be completed. During this procedure, a curved instrument (curette) will be used to scrape cells from your cervix or the top of your cervix (endocervix).  Your health care provider will record the location of any abnormalities. The procedure may vary among health care providers and hospitals. What happens after the procedure?  You will lie down and rest for a few minutes. You may be offered juice or cookies.  Your blood pressure, heart rate, breathing rate, and blood oxygen level will be monitored until any medicines you were given have worn off.  You may have to wear compression stockings. These stockings help to prevent blood clots and reduce swelling in your legs.  You may have some cramping in your abdomen. This should go away after a few minutes. This information is not intended to replace advice given to you by your health care provider. Make sure you discuss any questions you have with your health care  provider. Document Released: 06/21/2002 Document Revised: 11/27/2015 Document Reviewed: 11/05/2015 Elsevier Interactive Patient Education  2018 [employer] This sheet gives you information about how to care for yourself after your procedure. Your doctor may also give you more specific instructions. If you have problems or questions, contact your doctor. What can I expect after the procedure? If you did not have a tissue sample removed (did not have a biopsy), you may only have some spotting for a few days. You can go back to your normal activities. If you had a tissue sample removed, it is common to have:  Soreness and pain. This may last for a few days.  Light-headedness.  Mild bleeding from your vagina or dark-colored, grainy discharge from your vagina. This may last for a few days. You may need to wear a sanitary pad.  Spotting for at least 48 hours after the procedure.  Follow these instructions at home:  Take over-the-counter and prescription medicines only as told by your doctor. Ask your doctor what medicines you can start taking again. This is very important if you take blood-thinning medicine.  Do not drive or use heavy machinery while  taking prescription pain medicine.  For 3 days, or as long as your doctor tells you, avoid: ? Douching. ? Using tampons. ? Having sex.  If you use birth control (contraception), keep using it.  Limit activity for the first day after the procedure. Ask your doctor what activities are safe for you.  It is up to you to get the results of your procedure. Ask your doctor when your results will be ready.  Keep all follow-up visits as told by your doctor. This is important. Contact a doctor if:  You get a skin rash. Get help right away if:  You are bleeding a lot from your vagina. It is a lot of bleeding if you are using more than one pad an hour for 2 hours in a row.  You have clumps of blood (blood clots) coming  from your vagina.  You have a fever.  You have chills  You have pain in your lower belly (pelvic area).  You have signs of infection, such as vaginal discharge that is: ? Different than usual. ? Yellow. ? Bad-smelling.  You have very pain or cramps in your lower belly that do not get better with medicine.  You feel light-headed.  You feel dizzy.  You pass out (faint). Summary  If you did not have a tissue sample removed (did not have a biopsy), you may only have some spotting for a few days. You can go back to your normal activities.  If you had a tissue sample removed, it is common to have mild pain and spotting for 48 hours.  For 3 days, or as long as your doctor tells you, avoid douching, using tampons and having sex.  Get help right away if you have bleeding, very bad pain, or signs of infection. This information is not intended to replace advice given to you by your health care provider. Make sure you discuss any questions you have with your health care provider. Document Released: 09/17/2007 Document Revised: 12/19/2015 Document Reviewed: 12/19/2015 Elsevier Interactive Patient Education  2018 [employer].

## 2017-05-07 NOTE — Progress Notes (Signed)
Presents for COLPO, abnormal PAP CIN-1/HPV (LSIL).  UPT today is NEGATIVE

## 2017-05-21 ENCOUNTER — Ambulatory Visit: Payer: BC Managed Care – PPO | Admitting: Obstetrics

## 2017-06-09 DIAGNOSIS — K219 Gastro-esophageal reflux disease without esophagitis: Secondary | ICD-10-CM | POA: Insufficient documentation

## 2017-07-08 ENCOUNTER — Other Ambulatory Visit: Payer: Self-pay | Admitting: Nurse Practitioner

## 2017-07-08 DIAGNOSIS — R109 Unspecified abdominal pain: Secondary | ICD-10-CM

## 2017-07-18 ENCOUNTER — Encounter (HOSPITAL_COMMUNITY): Payer: Self-pay | Admitting: Emergency Medicine

## 2017-07-18 ENCOUNTER — Emergency Department (HOSPITAL_COMMUNITY)
Admission: EM | Admit: 2017-07-18 | Discharge: 2017-07-18 | Disposition: A | Discharge status: Home / Self Care | Payer: BC Managed Care – PPO | Attending: Emergency Medicine | Admitting: Emergency Medicine

## 2017-07-18 ENCOUNTER — Other Ambulatory Visit: Payer: Self-pay

## 2017-07-18 ENCOUNTER — Emergency Department (HOSPITAL_COMMUNITY): Payer: BC Managed Care – PPO

## 2017-07-18 DIAGNOSIS — R079 Chest pain, unspecified: Secondary | ICD-10-CM | POA: Diagnosis present

## 2017-07-18 DIAGNOSIS — M542 Cervicalgia: Secondary | ICD-10-CM | POA: Insufficient documentation

## 2017-07-18 DIAGNOSIS — Z79899 Other long term (current) drug therapy: Secondary | ICD-10-CM | POA: Insufficient documentation

## 2017-07-18 DIAGNOSIS — T148XXA Other injury of unspecified body region, initial encounter: Secondary | ICD-10-CM

## 2017-07-18 LAB — CBC WITH DIFFERENTIAL/PLATELET
Basophils Absolute: 0 10*3/uL (ref 0.0–0.1)
Basophils Relative: 0 %
Eosinophils Absolute: 0.1 10*3/uL (ref 0.0–0.7)
Eosinophils Relative: 1 %
HCT: 36.2 % (ref 36.0–46.0)
Hemoglobin: 11.9 g/dL — ABNORMAL LOW (ref 12.0–15.0)
Lymphocytes Relative: 34 %
Lymphs Abs: 2.3 10*3/uL (ref 0.7–4.0)
MCH: 28.2 pg (ref 26.0–34.0)
MCHC: 32.9 g/dL (ref 30.0–36.0)
MCV: 85.8 fL (ref 78.0–100.0)
Monocytes Absolute: 0.3 10*3/uL (ref 0.1–1.0)
Monocytes Relative: 4 %
Neutro Abs: 4.1 10*3/uL (ref 1.7–7.7)
Neutrophils Relative %: 61 %
Platelets: 297 10*3/uL (ref 150–400)
RBC: 4.22 MIL/uL (ref 3.87–5.11)
RDW: 14 % (ref 11.5–15.5)
WBC: 6.8 10*3/uL (ref 4.0–10.5)

## 2017-07-18 LAB — BASIC METABOLIC PANEL
Anion gap: 9 (ref 5–15)
BUN: 10 mg/dL (ref 6–20)
CO2: 23 mmol/L (ref 22–32)
Calcium: 9.1 mg/dL (ref 8.9–10.3)
Chloride: 104 mmol/L (ref 101–111)
Creatinine, Ser: 0.81 mg/dL (ref 0.44–1.00)
GFR calc Af Amer: >60 mL/min (ref >60)
GFR calc non Af Amer: >60 mL/min (ref >60)
Glucose, Bld: 90 mg/dL (ref 65–99)
Potassium: 3.9 mmol/L (ref 3.5–5.1)
Sodium: 136 mmol/L (ref 135–145)

## 2017-07-18 LAB — I-STAT BETA HCG BLOOD, ED (MC, WL, AP ONLY): I-stat hCG, quantitative: <5 m[IU]/mL (ref <5)

## 2017-07-18 LAB — I-STAT TROPONIN, ED: Troponin i, poc: 0.03 ng/mL (ref 0.00–0.08)

## 2017-07-18 LAB — D-DIMER, QUANTITATIVE: D-Dimer, Quant: 0.64 ug/mL-FEU — ABNORMAL HIGH (ref 0.00–0.50)

## 2017-07-18 MED ORDER — METHOCARBAMOL 500 MG PO TABS
500.0000 mg | ORAL_TABLET | Freq: Once | ORAL | Status: AC
  Administered 2017-07-18: 500 mg via ORAL
  Filled 2017-07-18: qty 1

## 2017-07-18 MED ORDER — IOPAMIDOL (ISOVUE-370) INJECTION 76%
INTRAVENOUS | Status: AC
  Administered 2017-07-18: 100 mL
  Filled 2017-07-18: qty 100

## 2017-07-18 MED ORDER — IOPAMIDOL (ISOVUE-370) INJECTION 76%: 100.0000 mL | Freq: Once | INTRAVENOUS | Status: DC | PRN

## 2017-07-18 MED ORDER — METHOCARBAMOL 500 MG PO TABS: 500.0000 mg | ORAL_TABLET | Freq: Two times a day (BID) | ORAL | 0 refills | Status: DC

## 2017-07-18 NOTE — ED Triage Notes (Signed)
Pt. Stated, I started having neck and shoulder pain last night no injury. I took Ibuprofen and no help.

## 2017-07-18 NOTE — ED Triage Notes (Signed)
Patient complains of congestion and wheezing since last night, reports that she is out of her symbacort

## 2017-07-18 NOTE — ED Provider Notes (Signed)
[facility] EMERGENCY DEPARTMENT Provider Note   CSN: [id number] Arrival date & time: 07/18/17  1149     History   Chief Complaint Chief Complaint  Patient presents with  . Chest Pain  . Shoulder Pain  . Neck Pain    HPI Felicia Pham is a 37 y.o. female history of uterine fibroids who presents for evaluation of anterior chest pain, neck pain and bilateral shoulder pain that began yesterday.  Patient reports that last night he started experiencing a diffuse pain that spread across her entire anterior chest up into her neck and bilateral shoulders.  Patient reports that she was sitting down at the time.  She does not recall any previous trauma, injury, fall.  She states she was not doing any pain is worse with deep inspiration and with exertion.  She denies any strenuous activity at work.  Patient reports that associated nausea, vomiting, diaphoresis.  Patient denies any associated shortness of breath.  Patient reports that she took 2 doses of ibuprofen with improvement in symptoms.  Patient denies any history of back surgery, fevers, numbness/weakness of her extremities, abdominal pain, nausea/vomiting.  Patient is currently on OCPs. She denies recent immobilization, prior history of DVT/PE, recent surgery, leg swelling, or long travel.  Patient denies any personal cardiac history.  She reports that her father had a heart attack at age 7.  Patient is not a current smoker.  Denies any illicit drug use.   The history is provided by the patient.    History reviewed. No pertinent past medical history.  Patient Active Problem List   Diagnosis Date Noted  . 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 Other and unspecified ovarian cyst 11/02/2012  . Leiomyoma of uterus, unspecified 08/05/2012    Past Surgical History:  Procedure Laterality Date  . MYOMECTOMY       OB  History    Gravida  0   Para  0   Term  0   Preterm  0   AB  0   Living  0     SAB  0   TAB  0   Ectopic  0   Multiple  0   Live Births               Home Medications    Prior to Admission medications   Medication Sig Start Date End Date Taking? Authorizing Provider  amoxicillin-clavulanate (AUGMENTIN) 875-125 MG tablet Take 1 tablet by mouth every 12 (twelve) hours. 12/28/16   Tasia Catchings, Amy V, PA-C  cetirizine-pseudoephedrine (ZYRTEC-D) 5-120 MG tablet Take 1 tablet by mouth daily. Patient not taking: Reported on 02/19/2017 12/28/16   Ok Edwards, PA-C  ibuprofen (ADVIL,MOTRIN) 200 MG tablet Take 600-800 mg by mouth every 6 (six) hours as needed. Reported on 09/25/2015    [provider]  ipratropium (ATROVENT) 0.06 % nasal spray Place 2 sprays into both nostrils 4 (four) times daily. Patient not taking: Reported on 02/19/2017 12/28/16   Ok Edwards, PA-C  Liraglutide (VICTOZA [place]) Inject into the skin.    [provider]  methocarbamol (ROBAXIN) 500 MG tablet Take 1 tablet (500 mg total) by mouth 2 (two) times daily. 07/18/17   Volanda Napoleon, PA-C  norethindrone (MICRONOR,CAMILA,ERRIN) 0.35 MG tablet TAKE ONE TABLET BY MOUTH ONCE DAILY 04/17/17   Shelly Bombard, MD    Family History Family History  Problem  Relation Age of Onset  . Lupus Mother   . Hypertension Mother   . Hypertension Father   . Diabetes Father   . Diabetes Maternal Grandmother   . Diabetes Paternal Grandmother     Social History Social History   Tobacco Use  . Smoking status: Never Smoker  . Smokeless tobacco: Never Used  Substance Use Topics  . Alcohol use: No  . Drug use: No     Allergies   Patient has no known allergies.   Review of Systems Review of Systems  Constitutional: Negative for chills and fever.  HENT: Negative for congestion.   Eyes: Negative for visual disturbance.  Respiratory: Negative for cough and shortness of breath.   Cardiovascular: Positive for  chest pain.  Gastrointestinal: Negative for abdominal pain, diarrhea, nausea and vomiting.  Genitourinary: Negative for dysuria and hematuria.  Musculoskeletal: Positive for neck pain. Negative for back pain.       Shoulder  Skin: Negative for rash.  Neurological: Negative for dizziness, weakness, numbness and headaches.  Psychiatric/Behavioral: Negative for confusion.     Physical Exam Updated Vital Signs BP 99/86 (BP Location: Right Arm)   Pulse 70   Temp 98 F (36.7 C) (Oral)   Resp 16   Ht 5\' 1"  (1.549 m)   Wt 117.9 kg (260 lb)   LMP 06/23/2017   SpO2 100%   BMI 49.13 kg/m   Physical Exam  Constitutional: She is oriented to person, place, and time. She appears well-developed and well-nourished.  HENT:  Head: Normocephalic and atraumatic.  Mouth/Throat: Oropharynx is clear and moist and mucous membranes are normal.  Eyes: Pupils are equal, round, and reactive to light. Conjunctivae, EOM and lids are normal.  Neck: Trachea normal and full passive range of motion without pain. Neck supple. Carotid bruit is not present. No neck rigidity. No thyroid mass present.    Full flexion/extension and lateral movement of neck fully intact.  Diffuse midline tenderness noted at the base of the cervical spine.  No overlying warmth, erythema.  No deformities or crepitus.   Cardiovascular: Normal rate, regular rhythm, normal heart sounds and normal pulses. Exam reveals no gallop and no friction rub.  No murmur heard. Pulmonary/Chest: Effort normal and breath sounds normal.  No evidence of respiratory distress. Able to speak in full sentences without difficulty.  No tenderness palpation of the anterior chest wall.  No deformity or crepitus noted.  Abdominal: Soft. Normal appearance. There is no tenderness. There is no rigidity and no guarding.  Musculoskeletal: Normal range of motion.  Neurological: She is alert and oriented to person, place, and time.  Follows commands, Moves all  extremities  5/5 strength to BUE and BLE  Sensation intact throughout all major nerve distributions  Skin: Skin is warm and dry. Capillary refill takes less than 2 seconds.  Psychiatric: She has a normal mood and affect. Her speech is normal.  Nursing note and vitals reviewed.    ED Treatments / Results  Labs (all labs ordered are listed, but only abnormal results are displayed) Labs Reviewed  CBC WITH DIFFERENTIAL/PLATELET - Abnormal; Notable for the following components:      Result Value   Hemoglobin 11.9 (*)    All other components within normal limits  D-DIMER, QUANTITATIVE (NOT AT [facility]) - Abnormal; Notable for the following components:   D-Dimer, Quant 0.64 (*)    All other components within normal limits  BASIC METABOLIC PANEL  I-STAT BETA HCG BLOOD, ED ([facility], [facility], [facility] ONLY)  I-STAT TROPONIN, ED    EKG None   EKG shows: Normal sinus rhythm, rate 72.  PR interval is 146.  QTC is 402.  Radiology Dg Chest 2 View  Result Date: 07/18/2017 CLINICAL DATA:  Acute onset of chest congestion and wheezing that began last night. Acute LEFT neck pain and LEFT shoulder pain. No known injury. EXAM: CHEST - 2 VIEW COMPARISON:  05/17/2013, 04/06/2012 and earlier. FINDINGS: Cardiomediastinal silhouette unremarkable. Lungs clear. Bronchovascular markings normal. Pulmonary vascularity normal. No pneumothorax. No pleural effusions. Visualized bony thorax intact. No interval change. IMPRESSION: No acute cardiopulmonary disease.  Stable examination. Electronically Signed   By: Evangeline Dakin M.D.   On: 07/18/2017 14:21   Dg Cervical Spine Complete  Result Date: 07/18/2017 CLINICAL DATA:  Neck and shoulder pain last night without known injury. EXAM: CERVICAL SPINE - COMPLETE 4+ VIEW COMPARISON:  None. FINDINGS: No cervical spine fracture. No subluxation. Intervertebral disc spaces are preserved. No prevertebral soft tissue swelling. Straightening of normal cervical lordosis evident. IMPRESSION: 1. No  evidence for cervical spine fracture. 2. Loss of cervical lordosis. This can be related to patient positioning, muscle spasm or soft tissue injury. Electronically Signed   By: Misty Stanley M.D.   On: 07/18/2017 14:19   Ct Angio Chest Pe W And/or Wo Contrast  Result Date: 07/18/2017 CLINICAL DATA:  Neck and chest pain EXAM: CT ANGIOGRAPHY CHEST WITH CONTRAST TECHNIQUE: Multidetector CT imaging of the chest was performed using the standard protocol during bolus administration of intravenous contrast. Multiplanar CT image reconstructions and MIPs were obtained to evaluate the vascular anatomy. CONTRAST:  141mL ISOVUE-370 IOPAMIDOL (ISOVUE-370) INJECTION 76% COMPARISON:  None. FINDINGS: Cardiovascular: Satisfactory opacification of the pulmonary arteries to the segmental level. No evidence of pulmonary embolism. Normal heart size. No pericardial effusion. Mediastinum/Nodes: No enlarged mediastinal, hilar, or axillary lymph nodes. Thyroid gland, trachea, and esophagus demonstrate no significant findings. Lungs/Pleura: Lungs are clear. No pleural effusion or pneumothorax. Upper Abdomen: No acute abnormality. Musculoskeletal: No chest wall abnormality. No acute or significant osseous findings. Review of the MIP images confirms the above findings. IMPRESSION: Normal study.  No cause for neck or chest pain identified. Electronically Signed   By: Dorise Bullion III M.D   On: 07/18/2017 17:01    Procedures Procedures (including critical care time)  Medications Ordered in ED Medications  iopamidol (ISOVUE-370) 76 % injection 100 mL (has no administration in time range)  methocarbamol (ROBAXIN) tablet 500 mg (500 mg Oral Given 07/18/17 1358)  iopamidol (ISOVUE-370) 76 % injection (100 mLs  Contrast Given 07/18/17 1621)     Initial Impression / Assessment and Plan / ED Course  I have reviewed the triage vital signs and the nursing notes.  Pertinent labs & imaging results that were available during my care of the  patient were reviewed by me and considered in my medical decision making (see chart for details).     37 yo F presents for evaluation of diffuse neck pain, anterior chest pain that radiates around to the back shoulders that began last night.  No preceding trauma, injury, fall.  Patient reports that she took ibuprofen with no improvement in symptoms.  Reports that pain is worse with deep inspiration and with exertion.  No associated nausea, vomiting, diaphoresis.  Patient is currently on birth control pills.  Denies any personal cardiac history. Patient is afebril, non-toxic appearing, sitting comfortably on examination table. Vital signs reviewed and stable.  On exam, patient has diffuse tenderness noted to the base  of the cervical spine.  Patient also reports diffuse tenderness overlying the trapezius muscles.  Pain in the chest is not reproduced with palpation or movement of the upper extremities.  Distal pulses intact bilaterally.  Lungs clear to auscultation.  No bruit noted bilaterally.  No neck mass or swelling.  Consider acute infectious etiology versus ACS etiology versus musculoskeletal versus anxiety.  Also consider PE, though low suspicion but also consideration given patient's history of OCP use.  Plan to check basic labs, x-rays, EKG.  Troponin is negative.  I-STAT beta is negative.  BMP is without any acute abnormalities.  CBC shows hemoglobin 11.9 but otherwise no acute leukocytosis.  D-dimer slightly elevated at 0.64.  Chest x-ray negative for any acute infectious etiology.  Cervical spine x-ray negative for any acute abnormality.  Given that patient's symptoms started yesterday, 1 troponin is sufficient for ACS rule out.  I discussed results with patient.  She reports some improvement in pain after analgesics.  Given elevation in d-dimer, will plan for CTA of chest for evaluation of PE.  CT reviewed.  Negative for any acute abnormalities.  No evidence of PEs.  Discussed results with patient.   Vital signs are stable.  Patient is ambulating in the department without any difficulty.  We will plan to treat as musculoskeletal pain and send patient home with symptomatic relief.  Patient instructed to follow-up with her primary care doctor in the next 24-48 hours for further evaluation. Patient had ample opportunity for questions and discussion. All patient's questions were answered with full understanding. Strict return precautions discussed. Patient expresses understanding and agreement to plan.   Final Clinical Impressions(s) / ED Diagnoses   Final diagnoses:  Chest pain, unspecified type  Muscle strain  Neck pain    ED Discharge Orders        Ordered    methocarbamol (ROBAXIN) 500 MG tablet  2 times daily     07/18/17 1753       Volanda Napoleon, PA-C 07/18/17 1806    Pattricia Boss, MD 07/19/17 [phone]

## 2017-07-18 NOTE — ED Notes (Signed)
Patient transported to Ultrasound 

## 2017-07-18 NOTE — ED Notes (Signed)
Patient transported to CT 

## 2017-07-18 NOTE — Discharge Instructions (Signed)
You can take Tylenol or Ibuprofen as directed for pain. You can alternate Tylenol and Ibuprofen every 4 hours. If you take Tylenol at 1pm, then you can take Ibuprofen at 5pm. Then you can take Tylenol again at 9pm.   Take Robaxin as prescribed. This medication will make you drowsy so do not drive or drink alcohol when taking it.  As we discussed, you will need to follow-up with your primary care doctor in the next 2-4 days for further evaluation.  Return to the department for any worsening pain, fevers, difficulty breathing, numbness/weakness of her arms or legs, vision changes, vomiting, any other worsening or concerning symptoms.

## 2017-07-27 ENCOUNTER — Ambulatory Visit
Admission: RE | Admit: 2017-07-27 | Discharge: 2017-07-27 | Disposition: A | Discharge status: Home / Self Care | Payer: BC Managed Care – PPO | Source: Ambulatory Visit | Attending: Nurse Practitioner | Admitting: Nurse Practitioner

## 2017-07-27 DIAGNOSIS — R109 Unspecified abdominal pain: Secondary | ICD-10-CM

## 2017-09-28 ENCOUNTER — Ambulatory Visit
Admission: RE | Admit: 2017-09-28 | Discharge: 2017-09-28 | Disposition: A | Discharge status: Home / Self Care | Payer: BC Managed Care – PPO | Source: Ambulatory Visit | Attending: Nurse Practitioner | Admitting: Nurse Practitioner

## 2017-09-28 ENCOUNTER — Other Ambulatory Visit: Payer: Self-pay | Admitting: Nurse Practitioner

## 2017-09-28 DIAGNOSIS — M79671 Pain in right foot: Secondary | ICD-10-CM

## 2017-11-05 ENCOUNTER — Encounter: Payer: Self-pay | Admitting: Physician Assistant

## 2017-11-05 ENCOUNTER — Encounter (HOSPITAL_COMMUNITY): Payer: Self-pay | Admitting: Physician Assistant

## 2017-11-05 ENCOUNTER — Ambulatory Visit: Payer: BC Managed Care – PPO | Admitting: Physician Assistant

## 2017-11-05 VITALS — BP 110/80 | HR 101 | Ht 64.0 in | Wt 271.0 lb

## 2017-11-05 DIAGNOSIS — R0789 Other chest pain: Secondary | ICD-10-CM

## 2017-11-05 DIAGNOSIS — R600 Localized edema: Secondary | ICD-10-CM | POA: Diagnosis not present

## 2017-11-05 NOTE — Progress Notes (Signed)
Cardiology Office Note    Date:  11/06/2017   ID:  Felicia Pham, DOB [birth year], MRN [id number]  PCP:  Minette Brine  Cardiologist:  New - case discussed with Dr. Harrell Gave  Chief Complaint  Patient presents with  . New Patient (Initial Visit)    case discussed with Dr. Harrell Gave, referred by Dr. Minette Brine  . Edema  . Chest Pain    History of Present Illness:  Felicia Pham is a 37 y.o. female with history of uterine fibroids who presents today for evaluation of chest pain.  She was previously evaluated for anterior chest pain, neck pain and bilateral shoulder pain in the emergency room on 07/18/2017.  She was ruled out by enzymes.  EKG did not show any ischemic changes.  CTA of the chest obtained on the same day did not show any sign of PE.  Since then, she also had a negative abdominal ultrasound on 07/27/2017.  She has been having some intermittent chest discomfort since April.  Prior to that she was also evaluated in [facility] ED in September 2018 for chest pain as well.  Her chest discomfort has no clear correlation with exertion.  She denies any exacerbating factors such as deep inspiration, body rotational palpation.  The last episode of chest pain was last night and she says her chest pain lasted several hours at a time.  Her EKG shows no ischemic changes.  Overall her chest discomfort is very atypical and that she does not have significant cardiac risk factors herself.  Her father did diagnosed with coronary artery disease at age 27, however she does not have any hypertension, hyperlipidemia, diabetes or smoking habit.  I discussed the case with DOD Dr. Harrell Gave, who recommended a plain old treadmill test.  As for her lower extremity swelling, it is really not bad, I would not recommend diuretic at this time.  She has no sign of heart failure on physical exam.   Past Medical History:  Diagnosis Date  . Uterine fibroid     Past Surgical History:  Procedure  Laterality Date  . MYOMECTOMY  2014    Current Medications: Outpatient Medications Prior to Visit  Medication Sig Dispense Refill  . ibuprofen (ADVIL,MOTRIN) 200 MG tablet Take 600-800 mg by mouth every 6 (six) hours as needed. Reported on 09/25/2015    . norethindrone (MICRONOR,CAMILA,ERRIN) 0.35 MG tablet TAKE ONE TABLET BY MOUTH ONCE DAILY 28 tablet 11  . amoxicillin-clavulanate (AUGMENTIN) 875-125 MG tablet Take 1 tablet by mouth every 12 (twelve) hours. 14 tablet 0  . cetirizine-pseudoephedrine (ZYRTEC-D) 5-120 MG tablet Take 1 tablet by mouth daily. (Patient not taking: Reported on 02/19/2017) 30 tablet 0  . ipratropium (ATROVENT) 0.06 % nasal spray Place 2 sprays into both nostrils 4 (four) times daily. (Patient not taking: Reported on 02/19/2017) 15 mL 0  . Liraglutide (VICTOZA ) Inject into the skin.    . methocarbamol (ROBAXIN) 500 MG tablet Take 1 tablet (500 mg total) by mouth 2 (two) times daily. 20 tablet 0   No facility-administered medications prior to visit.      Allergies:   Patient has no known allergies.   Social History   Socioeconomic History  . Marital status: Single    Spouse name: Not on file  . Number of children: Not on file  . Years of education: Not on file  . Highest education level: Not on file  Occupational History  . Not on file  Social Needs  .  Financial resource strain: Not on file  . Food insecurity:    Worry: Not on file    Inability: Not on file  . Transportation needs:    Medical: Not on file    Non-medical: Not on file  Tobacco Use  . Smoking status: Never Smoker  . Smokeless tobacco: Never Used  Substance and Sexual Activity  . Alcohol use: No  . Drug use: No  . Sexual activity: Yes    Partners: Male    Birth control/protection: Pill  Lifestyle  . Physical activity:    Days per week: Not on file    Minutes per session: Not on file  . Stress: Not on file  Relationships  . Social connections:    Talks on phone: Not on file     Gets together: Not on file    Attends religious service: Not on file    Active member of club or organization: Not on file    Attends meetings of clubs or organizations: Not on file    Relationship status: Not on file  Other Topics Concern  . Not on file  Social History Narrative  . Not on file     Family History:  The patient's family history includes Diabetes in her father, maternal grandmother, and paternal grandmother; Heart attack (age of onset: 22) in her father; Hypertension in her father and mother; Lupus in her mother.   ROS:   Please see the history of present illness.    ROS All other systems reviewed and are negative.   PHYSICAL EXAM:   VS:  BP 110/80 (BP Location: Left Arm, Patient Position: Sitting, Cuff Size: Large)   Pulse (!) 101   Ht 5\' 4"  (1.626 m)   Wt 271 lb (122.9 kg)   BMI 46.52 kg/m    GEN: Well nourished, well developed, in no acute distress  HEENT: normal  Neck: no JVD, carotid bruits, or masses Cardiac: RRR; no murmurs, rubs, or gallops,no edema  Respiratory:  clear to auscultation bilaterally, normal work of breathing GI: soft, nontender, nondistended, + BS MS: no deformity or atrophy  Skin: warm and dry, no rash Neuro:  Alert and Oriented x 3, Strength and sensation are intact Psych: euthymic mood, full affect  Wt Readings from Last 3 Encounters:  11/05/17 271 lb (122.9 kg)  07/18/17 260 lb (117.9 kg)  05/07/17 271 lb 14.4 oz (123.3 kg)      Studies/Labs Reviewed:   EKG:  EKG is ordered today.  The ekg ordered today demonstrates mild sinus tachycardia, nonspecific T wave changes, otherwise no obvious ischemia  Recent Labs: 07/18/2017: BUN 10; Creatinine, Ser 0.81; Hemoglobin 11.9; Platelets 297; Potassium 3.9; Sodium 136   Lipid Panel    Component Value Date/Time   CHOL 112 (L) 11/14/2014 1045   TRIG 45 11/14/2014 1045   HDL 50 11/14/2014 1045    Additional studies/ records that were reviewed today include:  CTA of chest  07/18/2017 FINDINGS: Cardiovascular: Satisfactory opacification of the pulmonary arteries to the segmental level. No evidence of pulmonary embolism. Normal heart size. No pericardial effusion.  Mediastinum/Nodes: No enlarged mediastinal, hilar, or axillary lymph nodes. Thyroid gland, trachea, and esophagus demonstrate no significant findings.  Lungs/Pleura: Lungs are clear. No pleural effusion or pneumothorax.  Upper Abdomen: No acute abnormality.  Musculoskeletal: No chest wall abnormality. No acute or significant osseous findings.  Review of the MIP images confirms the above findings.  IMPRESSION: Normal study.  No cause for neck or chest pain identified.  ASSESSMENT:    1. Atypical chest pain   2. Edema leg      PLAN:  In order of problems listed above:  1. Atypical chest pain: She does not have any significant cardiac risk factors.  She denies any possibility of pregnancy.  hCG was negative earlier this year when she went to the emergency room.  Given the atypical nature of her symptoms, we recommended plain old treadmill test.  2. Leg edema: Only trace amount of leg edema was observed on physical exam.  No diuretic is recommended at this time.  I recommend conservative management with the leg elevation.    Medication Adjustments/Labs and Tests Ordered: Current medicines are reviewed at length with the patient today.  Concerns regarding medicines are outlined above.  Medication changes, Labs and Tests ordered today are listed in the Patient Instructions below. Patient Instructions  Medication Instructions:  Your physician recommends that you continue on your current medications as directed. Please refer to the Current Medication list given to you today.  Labwork: None   Testing/Procedures: Your physician has requested that you have an exercise tolerance test. For further information please visit [email or web address]. Please also follow instruction sheet, as  given.  Follow-Up: Follow up as needed pending the test results   Any Other Special Instructions Will Be Listed Below (If Applicable).  If you need a refill on your cardiac medications before your next appointment, please call your pharmacy.     Hilbert Corrigan, [place]  11/06/2017 12:24 PM    [facility] [address], [place], [place]  [address] Phone: [phone]; Fax: [phone]

## 2017-11-05 NOTE — Patient Instructions (Signed)
Medication Instructions:  Your physician recommends that you continue on your current medications as directed. Please refer to the Current Medication list given to you today.  Labwork: None   Testing/Procedures: Your physician has requested that you have an exercise tolerance test. For further information please visit [email or web address]. Please also follow instruction sheet, as given.  Follow-Up: Follow up as needed pending the test results   Any Other Special Instructions Will Be Listed Below (If Applicable).  If you need a refill on your cardiac medications before your next appointment, please call your pharmacy.

## 2017-11-12 ENCOUNTER — Telehealth (HOSPITAL_COMMUNITY): Payer: Self-pay

## 2017-11-12 NOTE — Telephone Encounter (Signed)
Encounter complete. 

## 2017-11-13 ENCOUNTER — Ambulatory Visit: Payer: BC Managed Care – PPO | Admitting: Podiatry

## 2017-11-13 ENCOUNTER — Encounter: Payer: Self-pay | Admitting: Podiatry

## 2017-11-13 ENCOUNTER — Ambulatory Visit (INDEPENDENT_AMBULATORY_CARE_PROVIDER_SITE_OTHER): Payer: BC Managed Care – PPO

## 2017-11-13 ENCOUNTER — Telehealth (HOSPITAL_COMMUNITY): Payer: Self-pay

## 2017-11-13 DIAGNOSIS — B351 Tinea unguium: Secondary | ICD-10-CM

## 2017-11-13 DIAGNOSIS — M779 Enthesopathy, unspecified: Secondary | ICD-10-CM | POA: Diagnosis not present

## 2017-11-13 MED ORDER — METHYLPREDNISOLONE 4 MG PO TBPK: ORAL_TABLET | ORAL | 0 refills | Status: DC

## 2017-11-13 NOTE — Progress Notes (Signed)
Subjective:    Patient ID: Felicia Pham, female    DOB: 1980/09/05, 37 y.o.   MRN: [id number]  HPI 37 year old female presents the office today for concerns of right foot pain which is been ongoing for about 2 months.  She said that she has noticed some swelling she noticed some pain in the top of the side of her foot.  She does note some occasional swelling.  She is been taking ibuprofen without any significant improvement.  She does stand on her feet as she works at the present.  She states that her right foot feels tight but then shoes.  She denies any recent injury or trauma.  She does not wear boots at work.  She also has secondary concerns of toenail fungus.  The nails are not painful.  Denies any redness or drainage coming from the toenail sites.  She has no other concerns.   Review of Systems  All other systems reviewed and are negative.  Past Medical History:  Diagnosis Date  . Uterine fibroid     Past Surgical History:  Procedure Laterality Date  . MYOMECTOMY  2014     Current Outpatient Medications:  .  NON FORMULARY, [facility]  Onychomycosis Nail Lacquer -  Fluconazole 2%, Terbinafine 1% DMSO Apply to affected nail once daily Qty. 120 gm 3 refills, Disp: , Rfl:  .  ibuprofen (ADVIL,MOTRIN) 200 MG tablet, Take 600-800 mg by mouth every 6 (six) hours as needed. Reported on 09/25/2015, Disp: , Rfl:  .  methylPREDNISolone (MEDROL DOSEPAK) 4 MG TBPK tablet, Take as directed, Disp: 21 tablet, Rfl: 0 .  norethindrone (MICRONOR,CAMILA,ERRIN) 0.35 MG tablet, TAKE ONE TABLET BY MOUTH ONCE DAILY, Disp: 28 tablet, Rfl: 11  No Known Allergies       Objective:   Physical Exam General: AAO x3, NAD  Dermatological: Nails are mildly hypertrophic, dystrophic with ill-defined discoloration there is no pain in the nails there is no surrounding redness or drainage or any signs of infection.  No open lesions.  Vascular: Dorsalis Pedis artery and Posterior Tibial artery pedal  pulses are 2/4 bilateral with immedate capillary fill time. Pedal hair growth present. No varicosities and no lower extremity edema present bilateral. There is no pain with calf compression, swelling, warmth, erythema.   Neruologic: Grossly intact via light touch bilateral.  Protective threshold with Semmes Wienstein monofilament intact to all pedal sites bilateral.  Negative Tinel sign.  Musculoskeletal: There is mild tenderness along the dorsal aspect of the right foot on the midfoot but there is no specific area pinpoint tenderness.  Mild discomfort on the course of the peroneal tendon however the tendon appears to be intact.  Achilles tendon intact.  There is mild edema to the right foot there is no erythema or increase in warmth.  Ankle, subtalar joint range of motion intact but any restrictions.  Muscular strength 5/5 in all groups tested bilateral.  Gait: Unassisted, Nonantalgic.     Assessment & Plan:  37 year old female with right foot pain, tendinitis with onychomycosis -Treatment options discussed including all alternatives, risks, and complications -Etiology of symptoms were discussed -X-rays were obtained and reviewed with the patient.  There is no evidence of acute fracture or stress fracture identified today.  Joint space is maintained. -Prescribed Medrol Dosepak.  Declines steroid injection.  Discussed ice to the area daily.  Discussed wearing supportive shoes and also inserts inside of her shoes. -Discussed treatment options for nail fungus and after discussion she wished to  proceed with topical treatment.  I prescribed a topical ointment from [employer].   Return in about 1 month (around 12/11/2017).  Trula Slade DPM

## 2017-11-13 NOTE — Telephone Encounter (Signed)
Encounter complete. 

## 2017-11-17 ENCOUNTER — Inpatient Hospital Stay (HOSPITAL_COMMUNITY): Admission: RE | Admit: 2017-11-17 | Payer: BC Managed Care – PPO | Source: Ambulatory Visit

## 2017-11-26 ENCOUNTER — Telehealth (HOSPITAL_COMMUNITY): Payer: Self-pay

## 2017-11-26 NOTE — Telephone Encounter (Signed)
Encounter complete. 

## 2017-11-27 ENCOUNTER — Telehealth (HOSPITAL_COMMUNITY): Payer: Self-pay

## 2017-11-27 NOTE — Telephone Encounter (Signed)
Encounter complete. 

## 2017-12-01 ENCOUNTER — Ambulatory Visit (HOSPITAL_COMMUNITY)
Admission: RE | Admit: 2017-12-01 | Discharge: 2017-12-01 | Disposition: A | Discharge status: Home / Self Care | Payer: BC Managed Care – PPO | Source: Ambulatory Visit | Attending: Cardiovascular Disease | Admitting: Cardiovascular Disease

## 2017-12-01 DIAGNOSIS — R0789 Other chest pain: Secondary | ICD-10-CM

## 2017-12-02 LAB — EXERCISE TOLERANCE TEST
Estimated workload: 7.5 METS
Exercise duration (min): 6 min
Exercise duration (sec): 29 s
MPHR: 185 {beats}/min
Peak HR: 184 {beats}/min
Percent HR: 99 %
RPE: 17
Rest HR: 87 {beats}/min

## 2017-12-02 NOTE — Progress Notes (Signed)
Normal stress test result

## 2017-12-10 ENCOUNTER — Other Ambulatory Visit: Payer: Self-pay | Admitting: Endocrinology

## 2017-12-10 DIAGNOSIS — E041 Nontoxic single thyroid nodule: Secondary | ICD-10-CM

## 2017-12-17 ENCOUNTER — Ambulatory Visit: Payer: BC Managed Care – PPO | Admitting: Podiatry

## 2018-01-05 ENCOUNTER — Ambulatory Visit: Payer: BC Managed Care – PPO | Admitting: Podiatry

## 2018-01-07 ENCOUNTER — Ambulatory Visit
Admission: RE | Admit: 2018-01-07 | Discharge: 2018-01-07 | Disposition: A | Discharge status: Home / Self Care | Payer: BC Managed Care – PPO | Source: Ambulatory Visit | Attending: Endocrinology | Admitting: Endocrinology

## 2018-01-07 DIAGNOSIS — E041 Nontoxic single thyroid nodule: Secondary | ICD-10-CM

## 2018-01-11 ENCOUNTER — Ambulatory Visit: Payer: BC Managed Care – PPO | Admitting: Podiatry

## 2018-01-11 DIAGNOSIS — M2042 Other hammer toe(s) (acquired), left foot: Secondary | ICD-10-CM | POA: Diagnosis not present

## 2018-01-11 DIAGNOSIS — M2041 Other hammer toe(s) (acquired), right foot: Secondary | ICD-10-CM

## 2018-01-11 DIAGNOSIS — B351 Tinea unguium: Secondary | ICD-10-CM | POA: Diagnosis not present

## 2018-01-11 DIAGNOSIS — M779 Enthesopathy, unspecified: Secondary | ICD-10-CM

## 2018-01-11 NOTE — Patient Instructions (Signed)

## 2018-01-11 NOTE — Progress Notes (Signed)
Subjective: 37 year old female presents the office today for follow-up evaluation of right foot pain.  Since I last saw her she states the pain has resolved to her feet.  No pain, tenderness or any issues.  She also did get a compound cream for the onychomycosis and this is been helpful and does not have any problems with this.  She is only concerned because she is noticed that her toes most on her left foot has started to contract, states she is in the hammertoes.  She has no pain associated with this.  She was to help prevent this from getting worse. Denies any systemic complaints such as fevers, chills, nausea, vomiting. No acute changes since last appointment, and no other complaints at this time.   Objective: AAO x3, NAD DP/PT pulses palpable bilaterally, CRT less than 3 seconds This time there is no area of tenderness identified to bilateral lower extremities.  Specifically the pain that she is having on the dorsal aspect of the midfoot has resolved.  There is no tenderness along the course the peroneal tendon.  Danielson, plantar fascia, Achilles tendon appear to be intact.  There is a decrease in medial arch upon weightbearing.  Mild hammertoe contractures are present. Difficult to evaluate the nails as there is no polish present. No open lesions or pre-ulcerative lesions.  No pain with calf compression, swelling, warmth, erythema  Assessment: Resolved right foot pain, hammertoes, onychomycosis  Plan: -All treatment options discussed with the patient including all alternatives, risks, complications.  -Discussed with her pain the pain is resolved.  I want her to continue with stretching exercises daily and I gave her some exercises for plantar fasciitis although she does not have this.  Also discussed changing shoes as well as arch supports.  Also discussed exercises to help strengthen the toes.  Continue the topical antifungal medicine but does not penetrate the nail polish.  At this point I  will see her back as needed.  Call any questions or concerns and she agrees with this plan. -Patient encouraged to call the office with any questions, concerns, change in symptoms.   Trula Slade DPM

## 2018-02-22 ENCOUNTER — Ambulatory Visit: Payer: BC Managed Care – PPO | Admitting: Advanced Practice Midwife

## 2018-02-23 ENCOUNTER — Encounter: Payer: BC Managed Care – PPO | Admitting: Internal Medicine

## 2018-03-05 ENCOUNTER — Encounter: Payer: BC Managed Care – PPO | Admitting: Nurse Practitioner

## 2018-03-05 ENCOUNTER — Ambulatory Visit: Payer: BC Managed Care – PPO | Admitting: Obstetrics

## 2018-03-18 ENCOUNTER — Other Ambulatory Visit: Payer: Self-pay

## 2018-03-18 ENCOUNTER — Ambulatory Visit (HOSPITAL_COMMUNITY)
Admission: EM | Admit: 2018-03-18 | Discharge: 2018-03-18 | Disposition: A | Discharge status: Home / Self Care | Payer: BC Managed Care – PPO | Attending: Family Medicine | Admitting: Family Medicine

## 2018-03-18 ENCOUNTER — Encounter (HOSPITAL_COMMUNITY): Payer: Self-pay

## 2018-03-18 DIAGNOSIS — H60391 Other infective otitis externa, right ear: Secondary | ICD-10-CM

## 2018-03-18 MED ORDER — AMOXICILLIN-POT CLAVULANATE 875-125 MG PO TABS: 1.0000 | ORAL_TABLET | Freq: Two times a day (BID) | ORAL | 0 refills | Status: DC

## 2018-03-18 NOTE — ED Provider Notes (Signed)
[place]    CSN: [id number] Arrival date & time: 03/18/18  1478     History   Chief Complaint Chief Complaint  Patient presents with  . Facial Swelling    HPI HILARI WETHINGTON is a 37 y.o. female.   Symptoms started with right earache and tenderness below the ear and her neck.  And she noted some facial swelling and some postnasal drainage.  She has not had any fever.     Past Medical History:  Diagnosis Date  . Uterine fibroid     Patient Active Problem List   Diagnosis Date Noted  . 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 Other and unspecified ovarian cyst 11/02/2012  . Leiomyoma of uterus, unspecified 08/05/2012    Past Surgical History:  Procedure Laterality Date  . MYOMECTOMY  2014    OB History    Gravida  0   Para  0   Term  0   Preterm  0   AB  0   Living  0     SAB  0   TAB  0   Ectopic  0   Multiple  0   Live Births               Home Medications    Prior to Admission medications   Medication Sig Start Date End Date Taking? Authorizing Provider  ibuprofen (ADVIL,MOTRIN) 200 MG tablet Take 600-800 mg by mouth every 6 (six) hours as needed. Reported on 09/25/2015    [provider]  methylPREDNISolone (MEDROL DOSEPAK) 4 MG TBPK tablet Take as directed Patient not taking: Reported on 01/11/2018 11/13/17   Trula Slade, DPM  NON FORMULARY [facility]  Onychomycosis Nail Lacquer -  Fluconazole 2%, Terbinafine 1% DMSO Apply to affected nail once daily Qty. 120 gm 3 refills    [provider]  norethindrone (MICRONOR,CAMILA,ERRIN) 0.35 MG tablet TAKE ONE TABLET BY MOUTH ONCE DAILY 04/17/17   Shelly Bombard, MD    Family History Family History  Problem Relation Age of Onset  . Lupus Mother   . Hypertension Mother   . Hypertension Father   . Diabetes Father   . Heart attack Father 32    . Diabetes Maternal Grandmother   . Diabetes Paternal Grandmother     Social History Social History   Tobacco Use  . Smoking status: Never Smoker  . Smokeless tobacco: Never Used  Substance Use Topics  . Alcohol use: No  . Drug use: No     Allergies   Patient has no known allergies.   Review of Systems Review of Systems  Constitutional: Negative.   HENT: Positive for congestion, ear pain, facial swelling and postnasal drip.   Respiratory: Negative for cough.   All other systems reviewed and are negative.    Physical Exam Triage Vital Signs ED Triage Vitals  Enc Vitals Group     BP 03/18/18 1929 (!) 146/88     Pulse Rate 03/18/18 1929 81     Resp 03/18/18 1929 18     Temp 03/18/18 1929 98.1 F (36.7 C)     Temp Source 03/18/18 1929 Oral     SpO2 03/18/18 1929 100 %     Weight 03/18/18 1930 274 lb (124.3 kg)     Height --      Head Circumference --      Peak Flow --  Pain Score 03/18/18 1930 7     Pain Loc --      Pain Edu? --      Excl. in [place]? --    No data found.  Updated Vital Signs BP (!) 146/88 (BP Location: Right Arm)   Pulse 81   Temp 98.1 F (36.7 C) (Oral)   Resp 18   Wt 124.3 kg   SpO2 100%   BMI 47.03 kg/m   Visual Acuity Right Eye Distance:   Left Eye Distance:   Bilateral Distance:    Right Eye Near:   Left Eye Near:    Bilateral Near:     Physical Exam  Constitutional: She is oriented to person, place, and time. She appears well-developed and well-nourished.  HENT:  Head: Normocephalic.  Nose: Nose normal.  Mouth/Throat: Oropharynx is clear and moist.  Right tympanic membrane is pink and dull without normal landmarks  Cardiovascular: Normal rate and regular rhythm.  Pulmonary/Chest: Effort normal and breath sounds normal.  Neurological: She is alert and oriented to person, place, and time.  Psychiatric: She has a normal mood and affect.     UC Treatments / Results  Labs (all labs ordered are listed, but only  abnormal results are displayed) Labs Reviewed - No data to display  EKG None  Radiology No results found.  Procedures Procedures (including critical care time)  Medications Ordered in UC Medications - No data to display  Initial Impression / Assessment and Plan / UC Course  I have reviewed the triage vital signs and the nursing notes.  Pertinent labs & imaging results that were available during my care of the patient were reviewed by me and considered in my medical decision making (see chart for details).     Right otitis and probable sinusitis.  Will treat with amoxicillin.  Also recommended nasal steroid spray to be used once a day Final Clinical Impressions(s) / UC Diagnoses   Final diagnoses:  None   Discharge Instructions   None    ED Prescriptions    None     Controlled Substance Prescriptions [place] Controlled Substance Registry consulted? No   Wardell Honour, MD 03/18/18 1946

## 2018-03-18 NOTE — ED Triage Notes (Signed)
Pt cc facial swelling , right ear discomfort, and sore throat. X 2 days

## 2018-03-23 ENCOUNTER — Ambulatory Visit: Payer: BC Managed Care – PPO | Admitting: Obstetrics

## 2018-03-30 ENCOUNTER — Encounter: Payer: Self-pay | Admitting: Internal Medicine

## 2018-03-30 ENCOUNTER — Ambulatory Visit: Payer: BC Managed Care – PPO | Admitting: Internal Medicine

## 2018-03-30 VITALS — BP 128/70 | HR 87 | Temp 98.2°F | Ht 63.0 in | Wt 261.4 lb

## 2018-03-30 DIAGNOSIS — Z6837 Body mass index (BMI) 37.0-37.9, adult: Secondary | ICD-10-CM | POA: Insufficient documentation

## 2018-03-30 DIAGNOSIS — R197 Diarrhea, unspecified: Secondary | ICD-10-CM | POA: Insufficient documentation

## 2018-03-30 DIAGNOSIS — R5381 Other malaise: Secondary | ICD-10-CM | POA: Insufficient documentation

## 2018-03-30 DIAGNOSIS — M549 Dorsalgia, unspecified: Secondary | ICD-10-CM | POA: Insufficient documentation

## 2018-03-30 DIAGNOSIS — R5383 Other fatigue: Secondary | ICD-10-CM

## 2018-03-30 DIAGNOSIS — Z0000 Encounter for general adult medical examination without abnormal findings: Secondary | ICD-10-CM

## 2018-03-30 DIAGNOSIS — E782 Mixed hyperlipidemia: Secondary | ICD-10-CM | POA: Insufficient documentation

## 2018-03-30 DIAGNOSIS — R110 Nausea: Secondary | ICD-10-CM | POA: Diagnosis not present

## 2018-03-30 DIAGNOSIS — J209 Acute bronchitis, unspecified: Secondary | ICD-10-CM | POA: Insufficient documentation

## 2018-03-30 DIAGNOSIS — M6281 Muscle weakness (generalized): Secondary | ICD-10-CM | POA: Insufficient documentation

## 2018-03-30 DIAGNOSIS — R079 Chest pain, unspecified: Secondary | ICD-10-CM | POA: Insufficient documentation

## 2018-03-30 DIAGNOSIS — B070 Plantar wart: Secondary | ICD-10-CM | POA: Diagnosis not present

## 2018-03-30 DIAGNOSIS — M79605 Pain in left leg: Secondary | ICD-10-CM | POA: Insufficient documentation

## 2018-03-30 DIAGNOSIS — Z0001 Encounter for general adult medical examination with abnormal findings: Secondary | ICD-10-CM

## 2018-03-30 DIAGNOSIS — Z6841 Body Mass Index (BMI) 40.0 and over, adult: Secondary | ICD-10-CM

## 2018-03-30 DIAGNOSIS — R42 Dizziness and giddiness: Secondary | ICD-10-CM | POA: Insufficient documentation

## 2018-03-30 DIAGNOSIS — Z79899 Other long term (current) drug therapy: Secondary | ICD-10-CM | POA: Insufficient documentation

## 2018-03-30 DIAGNOSIS — R7309 Other abnormal glucose: Secondary | ICD-10-CM | POA: Insufficient documentation

## 2018-03-30 DIAGNOSIS — R1030 Lower abdominal pain, unspecified: Secondary | ICD-10-CM | POA: Insufficient documentation

## 2018-03-30 DIAGNOSIS — I10 Essential (primary) hypertension: Secondary | ICD-10-CM | POA: Insufficient documentation

## 2018-03-30 DIAGNOSIS — E049 Nontoxic goiter, unspecified: Secondary | ICD-10-CM | POA: Insufficient documentation

## 2018-03-30 DIAGNOSIS — J029 Acute pharyngitis, unspecified: Secondary | ICD-10-CM | POA: Insufficient documentation

## 2018-03-30 LAB — POCT URINALYSIS DIPSTICK
Glucose, UA: NEGATIVE
Ketones, UA: 40
Leukocytes, UA: NEGATIVE
Nitrite, UA: NEGATIVE
Protein, UA: NEGATIVE
Spec Grav, UA: >=1.03 — AB (ref 1.010–1.025)
Urobilinogen, UA: 0.2 E.U./dL
pH, UA: 5.5 (ref 5.0–8.0)

## 2018-03-30 NOTE — Patient Instructions (Addendum)
Obesity, Adult Obesity is having too much body fat. If you have a BMI of 30 or more, you are obese. BMI is a number that explains how much body fat you have. Obesity is often caused by taking in (consuming) more calories than your body uses. Obesity can cause serious health problems. Changing your lifestyle can help to treat obesity. Follow these instructions at home: Eating and drinking   Follow advice from your doctor about what to eat and drink. Your doctor may tell you to: ? Cut down on (limit) fast foods, sweets, and processed snack foods. ? Choose low-fat options. For example, choose low-fat milk instead of whole milk. ? Eat 5 or more servings of fruits or vegetables every day. ? Eat at home more often. This gives you more control over what you eat. ? Choose healthy foods when you eat out. ? Learn what a healthy portion size is. A portion size is the amount of a certain food that is healthy for you to eat at one time. This is different for each person. ? Keep low-fat snacks available. ? Avoid sugary drinks. These include soda, fruit juice, iced tea that is sweetened with sugar, and flavored milk. ? Eat a healthy breakfast.  Drink enough water to keep your pee (urine) clear or pale yellow.  Do not go without eating for long periods of time (do not fast).  Do not go on popular or trendy diets (fad diets). Physical Activity  Exercise often, as told by your doctor. Ask your doctor: ? What types of exercise are safe for you. ? How often you should exercise.  Warm up and stretch before being active.  Do slow stretching after being active (cool down).  Rest between times of being active. Lifestyle  Limit how much time you spend in front of your TV, computer, or video game system (be less sedentary).  Find ways to reward yourself that do not involve food.  Limit alcohol intake to no more than 1 drink a day for nonpregnant women and 2 drinks a day for men. One drink  equals 12 oz of beer, 5 oz of wine, or 1 oz of hard liquor. General instructions  Keep a weight loss journal. This can help you keep track of: ? The food that you eat. ? The exercise that you do.  Take over-the-counter and prescription medicines only as told by your doctor.  Take vitamins and supplements only as told by your doctor.  Think about joining a support group. Your doctor may be able to help with this.  Keep all follow-up visits as told by your doctor. This is important. Contact a doctor if:  You cannot meet your weight loss goal after you have changed your diet and lifestyle for 6 weeks. This information is not intended to replace advice given to you by your health care provider. Make sure you discuss any questions you have with your health care provider. Document Released: 06/23/2011 Document Revised: 09/06/2015 Document Reviewed: 01/17/2015 Elsevier Interactive Patient Education  2018 [place].  Plantar Warts Plantar warts are small growths on the bottom of the foot (sole). Warts are caused by a type of germ (virus). Most warts are not painful, and they usually do not cause problems. Sometimes, plantar warts can cause pain when you walk. Warts often go away on their own in time. Treatments may be done if needed. Follow these instructions at home: General instructions  Apply creams or solutions only as told by  your doctor. Follow these steps if your doctor tells you to do so: ? Soak your foot in warm water. ? Remove the top layer of softened skin before you apply the medicine. You can use a pumice stone to remove the tissue. ? After you apply the medicine, put a bandage over the area of the wart. ? Repeat the process every day or as told by your doctor.  Do not scratch or pick at a wart.  Wash your hands after you touch a wart.  If a wart is painful, try putting a bandage with a hole in the middle over the wart.  Keep all follow-up visits as told by your  doctor. This is important. Prevention  Wear shoes and socks. Change socks every day.  Keep your feet clean and dry.  Check your feet often.  Avoid direct contact with warts on other people. Contact a doctor if:  Your warts do not improve after treatment.  You have redness, swelling, or pain at the site of a wart.  You have bleeding from a wart, and the bleeding does not stop when you put light pressure on the wart.  You have diabetes and you get a wart. This information is not intended to replace advice given to you by your health care provider. Make sure you discuss any questions you have with your health care provider. Document Released: 05/03/2010 Document Revised: 09/06/2015 Document Reviewed: 06/26/2014 Elsevier Interactive Patient Education  2018 [place] 18-39 Years, Female Preventive care refers to lifestyle choices and visits with your health care provider that can promote health and wellness. What does preventive care include?  A yearly physical exam. This is also called an annual well check.  Dental exams once or twice a year.  Routine eye exams. Ask your health care provider how often you should have your eyes checked.  Personal lifestyle choices, including: ? Daily care of your teeth and gums. ? Regular physical activity. ? Eating a healthy diet. ? Avoiding tobacco and drug use. ? Limiting alcohol use. ? Practicing safe sex. ? Taking vitamin and mineral supplements as recommended by your health care provider. What happens during an annual well check? The services and screenings done by your health care provider during your annual well check will depend on your age, overall health, lifestyle risk factors, and family history of disease. Counseling Your health care provider may ask you questions about your:  Alcohol use.  Tobacco use.  Drug use.  Emotional well-being.  Home and relationship well-being.  Sexual activity.  Eating  habits.  Work and work Statistician.  Method of birth control.  Menstrual cycle.  Pregnancy history.  Screening You may have the following tests or measurements:  Height, weight, and BMI.  Diabetes screening. This is done by checking your blood sugar (glucose) after you have not eaten for a while (fasting).  Blood pressure.  Lipid and cholesterol levels. These may be checked every 5 years starting at age 41.  Skin check.  Hepatitis C blood test.  Hepatitis B blood test.  Sexually transmitted disease (STD) testing.  BRCA-related cancer screening. This may be done if you have a family history of breast, ovarian, tubal, or peritoneal cancers.  Pelvic exam and Pap test. This may be done every 3 years starting at age 77. Starting at age 38, this may be done every 5 years if you have a Pap test in combination with an HPV test.  Discuss your test results, treatment options, and  if necessary, the need for more tests with your health care provider. Vaccines Your health care provider may recommend certain vaccines, such as:  Influenza vaccine. This is recommended every year.  Tetanus, diphtheria, and acellular pertussis (Tdap, Td) vaccine. You may need a Td booster every 10 years.  Varicella vaccine. You may need this if you have not been vaccinated.  HPV vaccine. If you are 23 or younger, you may need three doses over 6 months.  Measles, mumps, and rubella (MMR) vaccine. You may need at least one dose of MMR. You may also need a second dose.  Pneumococcal 13-valent conjugate (PCV13) vaccine. You may need this if you have certain conditions and were not previously vaccinated.  Pneumococcal polysaccharide (PPSV23) vaccine. You may need one or two doses if you smoke cigarettes or if you have certain conditions.  Meningococcal vaccine. One dose is recommended if you are age 24-21 years and a first-year college student living in a residence hall, or if you have one of several  medical conditions. You may also need additional booster doses.  Hepatitis A vaccine. You may need this if you have certain conditions or if you travel or work in places where you may be exposed to hepatitis A.  Hepatitis B vaccine. You may need this if you have certain conditions or if you travel or work in places where you may be exposed to hepatitis B.  Haemophilus influenzae type b (Hib) vaccine. You may need this if you have certain risk factors.  Talk to your health care provider about which screenings and vaccines you need and how often you need them. This information is not intended to replace advice given to you by your health care provider. Make sure you discuss any questions you have with your health care provider. Document Released: 05/27/2001 Document Revised: 12/19/2015 Document Reviewed: 01/30/2015 Elsevier Interactive Patient Education  [employer].

## 2018-03-30 NOTE — Progress Notes (Signed)
* Subjective:     Patient ID: Felicia Pham , female    DOB: 12/02/1980 , 37 y.o.   MRN: [id number]   Chief Complaint  Patient presents with  . Annual Exam  . Nausea    C/O feeling nausea for the past few weeks    HPI  Pt is here for annual physical. Her pap will be 12/31 with her GYN. Last eye xm 01/2018. Dental check up will be 12/30. LMP 12/9 which was normal.. Is on oral contraception.   Past Medical History:  Diagnosis Date  . Uterine fibroid      Family History  Problem Relation Age of Onset  . Lupus Mother   . Hypertension Mother   . Hypertension Father   . Diabetes Father   . Heart attack Father 65  . Diabetes Maternal Grandmother   . Diabetes Paternal Grandmother      Current Outpatient Medications:  .  hydrocortisone 2.5 % cream, , Disp: , Rfl: 2 .  ibuprofen (ADVIL,MOTRIN) 200 MG tablet, Take 600-800 mg by mouth every 6 (six) hours as needed. Reported on 09/25/2015, Disp: , Rfl:  .  NON FORMULARY, [facility]  Onychomycosis Nail Lacquer -  Fluconazole 2%, Terbinafine 1% DMSO Apply to affected nail once daily Qty. 120 gm 3 refills, Disp: , Rfl:  .  norethindrone (MICRONOR,CAMILA,ERRIN) 0.35 MG tablet, TAKE ONE TABLET BY MOUTH ONCE DAILY, Disp: 28 tablet, Rfl: 11   No Known Allergies   Review of Systems  Constitutional: Negative for activity change, appetite change, chills, diaphoresis, fatigue, fever and unexpected weight change.  HENT: Negative.   Eyes: Positive for visual disturbance.       Cant see far off without her glasses.   Respiratory: Negative for cough and shortness of breath.   Cardiovascular: Positive for leg swelling. Negative for chest pain and palpitations.       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 Negative for abdominal distention, abdominal pain and blood in stool.       Nausea x 2 weeks. Gets occasional constipation.   Endocrine: Positive for  cold intolerance. Negative for heat intolerance, polydipsia and polyphagia.  Genitourinary: Negative for dysuria, frequency and urgency.  Musculoskeletal: Negative.  Negative for back pain.  Skin: Positive for rash. Negative for color change and wound.       Gets intermittent rash on axilla, buttocks and under breasts and has seen derm and was diagnosed with eczema and uses rx topical rx cream.   Allergic/Immunologic: Negative for environmental allergies and food allergies.  Neurological: Negative for dizziness and headaches.  Hematological: Negative for adenopathy. Bruises/bleeds easily.       Bruises  easily  Psychiatric/Behavioral: Negative for sleep disturbance. The patient is not nervous/anxious.        No depression     Today's Vitals   03/30/18 1452  BP: 128/70  Pulse: 87  Temp: 98.2 F (36.8 C)  TempSrc: Oral  SpO2: 97%  Weight: 261 lb 6.4 oz (118.6 kg)  Height: 5\' 3"  (1.6 m)   Body mass index is 46.3 kg/m.   Objective:  Physical Exam  BP 128/70 (BP Location: Right Arm, Patient Position: Sitting, Cuff Size: Large)   Pulse 87   Temp 98.2 F (36.8 C) (Oral)   Ht 5\' 3"  (1.6 m)   Wt 261 lb 6.4 oz (118.6 kg)   LMP 03/22/2018   SpO2 97%  BMI 46.30 kg/m   General Appearance:    Alert, cooperative, no distress, appears stated age, morbidly obese.   Head:    Normocephalic, without obvious abnormality, atraumatic  Eyes:    PERRL, conjunctiva/corneas clear, EOM's intact, fundi    benign, both eyes  Ears:    Normal TM's and external ear canals, both ears  Nose:   Nares normal, septum midline, mucosa normal, no drainage    or sinus tenderness  Throat:   Lips, mucosa, and tongue normal; teeth and gums normal  Neck:   Supple, symmetrical, trachea midline, no adenopathy;    thyroid:  no enlargement/tenderness/nodules; no carotid   bruit or JVD  Back:     Symmetric, no curvature, ROM normal, no CVA tenderness  Lungs:     Clear to auscultation bilaterally, respirations  unlabored  Chest Wall:    No tenderness or deformity   Heart:    Regular rate and rhythm, S1 and S2 normal, no murmur, rub   or gallop     Abdomen:     Soft, non-tender, bowel sounds active all four quadrants,    no masses, no organomegaly        Extremities:   Extremities normal, atraumatic, no cyanosis or edema  Pulses:   2+ and symmetric all extremities  Skin:   Skin color, texture, turgor normal, has callus on ball of L foot which looks to be a plantar wart.   Lymph nodes:   Cervical, supraclavicular, and axillary nodes normal  Neurologic:   CNII-XII intact, normal strength, sensation and reflexes    Throughout. Normal rhomberg, tandem gait, tip toe and heel gait, though she was limping due to wart on L foot.       Assessment And Plan:   1. Encounter for general adult medical examination with abnormal findings- routine - POCT Urinalysis Dipstick (81002) - CMP14 + Anion Gap - CBC no Diff - Lipid Profile - Hemoglobin A1c - TSH - T4, Free - T3, free  2. Plantar wart of left foot- acute. Advised to use over the counter patches for warts.  3. Class 3 severe obesity due to excess calories without serious comorbidity with body mass index (BMI) of 45.0 to 49.9 in adult [facility])- information about cutting calories given. She is aware she needs to start exercising.   4- Nausea- unknown etiology. I sent Zofran as noted  I will await on her labs.      Earland Reish RODRIGUEZ-SOUTHWORTH, PA-C

## 2018-03-31 ENCOUNTER — Encounter: Payer: Self-pay | Admitting: Internal Medicine

## 2018-03-31 LAB — CMP14 + ANION GAP
ALT: 14 IU/L (ref 0–32)
AST: 19 IU/L (ref 0–40)
Albumin/Globulin Ratio: 1.5 (ref 1.2–2.2)
Albumin: 4.2 g/dL (ref 3.5–5.5)
Alkaline Phosphatase: 78 IU/L (ref 39–117)
Anion Gap: 16 mmol/L (ref 10.0–18.0)
BUN/Creatinine Ratio: 13 (ref 9–23)
BUN: 11 mg/dL (ref 6–20)
Bilirubin Total: 0.3 mg/dL (ref 0.0–1.2)
CO2: 21 mmol/L (ref 20–29)
Calcium: 9.8 mg/dL (ref 8.7–10.2)
Chloride: 100 mmol/L (ref 96–106)
Creatinine, Ser: 0.86 mg/dL (ref 0.57–1.00)
GFR calc Af Amer: 101 mL/min/{1.73_m2} (ref >59)
GFR calc non Af Amer: 87 mL/min/{1.73_m2} (ref >59)
Globulin, Total: 2.8 g/dL (ref 1.5–4.5)
Glucose: 77 mg/dL (ref 65–99)
Potassium: 4 mmol/L (ref 3.5–5.2)
Sodium: 137 mmol/L (ref 134–144)
Total Protein: 7 g/dL (ref 6.0–8.5)

## 2018-03-31 LAB — CBC
Hematocrit: 35 % (ref 34.0–46.6)
Hemoglobin: 11.8 g/dL (ref 11.1–15.9)
MCH: 28.2 pg (ref 26.6–33.0)
MCHC: 33.7 g/dL (ref 31.5–35.7)
MCV: 84 fL (ref 79–97)
Platelets: 333 10*3/uL (ref 150–450)
RBC: 4.18 x10E6/uL (ref 3.77–5.28)
RDW: 14.3 % (ref 12.3–15.4)
WBC: 7.9 10*3/uL (ref 3.4–10.8)

## 2018-03-31 LAB — HEMOGLOBIN A1C
Est. average glucose Bld gHb Est-mCnc: 111 mg/dL
Hgb A1c MFr Bld: 5.5 % (ref 4.8–5.6)

## 2018-03-31 LAB — LIPID PANEL
Chol/HDL Ratio: 1.7 ratio (ref 0.0–4.4)
Cholesterol, Total: 96 mg/dL — ABNORMAL LOW (ref 100–199)
HDL: 55 mg/dL (ref >39)
LDL Calculated: 34 mg/dL (ref 0–99)
Triglycerides: 34 mg/dL (ref 0–149)
VLDL Cholesterol Cal: 7 mg/dL (ref 5–40)

## 2018-03-31 LAB — T4, FREE: Free T4: 1.35 ng/dL (ref 0.82–1.77)

## 2018-03-31 LAB — T3, FREE: T3, Free: 2.9 pg/mL (ref 2.0–4.4)

## 2018-03-31 LAB — TSH: TSH: 1.48 u[IU]/mL (ref 0.450–4.500)

## 2018-03-31 MED ORDER — ONDANSETRON HCL 4 MG PO TABS: 4.0000 mg | ORAL_TABLET | Freq: Three times a day (TID) | ORAL | 0 refills | Status: AC | PRN

## 2018-04-13 ENCOUNTER — Ambulatory Visit (INDEPENDENT_AMBULATORY_CARE_PROVIDER_SITE_OTHER): Payer: BC Managed Care – PPO | Admitting: Obstetrics

## 2018-04-13 ENCOUNTER — Encounter: Payer: Self-pay | Admitting: Obstetrics

## 2018-04-13 VITALS — BP 119/77 | HR 80 | Ht 64.0 in | Wt 269.0 lb

## 2018-04-13 DIAGNOSIS — B373 Candidiasis of vulva and vagina: Secondary | ICD-10-CM

## 2018-04-13 DIAGNOSIS — B9689 Other specified bacterial agents as the cause of diseases classified elsewhere: Secondary | ICD-10-CM

## 2018-04-13 DIAGNOSIS — N760 Acute vaginitis: Secondary | ICD-10-CM | POA: Diagnosis not present

## 2018-04-13 DIAGNOSIS — Z3041 Encounter for surveillance of contraceptive pills: Secondary | ICD-10-CM

## 2018-04-13 DIAGNOSIS — Z01419 Encounter for gynecological examination (general) (routine) without abnormal findings: Secondary | ICD-10-CM

## 2018-04-13 DIAGNOSIS — Z113 Encounter for screening for infections with a predominantly sexual mode of transmission: Secondary | ICD-10-CM | POA: Diagnosis not present

## 2018-04-13 DIAGNOSIS — Z1151 Encounter for screening for human papillomavirus (HPV): Secondary | ICD-10-CM | POA: Diagnosis not present

## 2018-04-13 DIAGNOSIS — Z124 Encounter for screening for malignant neoplasm of cervix: Secondary | ICD-10-CM | POA: Diagnosis not present

## 2018-04-13 DIAGNOSIS — N898 Other specified noninflammatory disorders of vagina: Secondary | ICD-10-CM

## 2018-04-13 MED ORDER — NORETHINDRONE 0.35 MG PO TABS: 1.0000 | ORAL_TABLET | Freq: Every day | ORAL | 11 refills | Status: DC

## 2018-04-13 NOTE — Progress Notes (Signed)
Subjective:        Felicia Pham is a 37 y.o. female here for a routine exam.  Current complaints: nONE.    Personal health questionnaire:  Is patient [name], have a family history of breast and/or ovarian cancer: no Is there a family history of uterine cancer diagnosed at age < 81, gastrointestinal cancer, urinary tract cancer, family member who is a Field seismologist syndrome-associated carrier: no Is the patient overweight and hypertensive, family history of diabetes, personal history of gestational diabetes, preeclampsia or PCOS: no Is patient over 54, have PCOS,  family history of premature CHD under age 33, diabetes, smoke, have hypertension or peripheral artery disease:  no At any time, has a partner hit, kicked or otherwise hurt or frightened you?: no Over the past 2 weeks, have you felt down, depressed or hopeless?: no Over the past 2 weeks, have you felt little interest or pleasure in doing things?:no   Gynecologic History Patient's last menstrual period was 03/22/2018. Contraception: oral progesterone-only contraceptive Last Pap: 2018. Results were: LGSIL with negative HPV.   Colposcopic biopsies and ECC negative for dysplasia. Last mammogram: n/a. Results were: n/a  Obstetric History OB History  Gravida Para Term Preterm AB Living  0 0 0 0 0 0  SAB TAB Ectopic Multiple Live Births  0 0 0 0      Past Medical History:  Diagnosis Date  . Uterine fibroid     Past Surgical History:  Procedure Laterality Date  . MYOMECTOMY  2014     Current Outpatient Medications:  .  norethindrone (MICRONOR,CAMILA,ERRIN) 0.35 MG tablet, Take 1 tablet (0.35 mg total) by mouth daily., Disp: 28 tablet, Rfl: 11 .  ondansetron (ZOFRAN) 4 MG tablet, Take 1 tablet (4 mg total) by mouth every 8 (eight) hours as needed for nausea or vomiting., Disp: 30 tablet, Rfl: 0 .  hydrocortisone 2.5 % cream, , Disp: , Rfl: 2 .  ibuprofen (ADVIL,MOTRIN) 200 MG tablet, Take 600-800 mg by mouth every  6 (six) hours as needed. Reported on 09/25/2015, Disp: , Rfl:  .  NON FORMULARY, [facility]  Onychomycosis Nail Lacquer -  Fluconazole 2%, Terbinafine 1% DMSO Apply to affected nail once daily Qty. 120 gm 3 refills, Disp: , Rfl:  No Known Allergies  Social History   Tobacco Use  . Smoking status: Never Smoker  . Smokeless tobacco: Never Used  Substance Use Topics  . Alcohol use: No    Family History  Problem Relation Age of Onset  . Lupus Mother   . Hypertension Mother   . Hypertension Father   . Diabetes Father   . Heart attack Father 68  . Diabetes Maternal Grandmother   . Diabetes Paternal Grandmother       Review of Systems  Constitutional: negative for fatigue and weight loss Respiratory: negative for cough and wheezing Cardiovascular: negative for chest pain, fatigue and palpitations Gastrointestinal: negative for abdominal pain and change in bowel habits Musculoskeletal:negative for myalgias Neurological: negative for gait problems and tremors Behavioral/Psych: negative for abusive relationship, depression Endocrine: negative for temperature intolerance    Genitourinary:negative for abnormal menstrual periods, genital lesions, hot flashes, sexual problems and vaginal discharge Integument/breast: negative for breast lump, breast tenderness, nipple discharge and skin lesion(s)    Objective:       BP 119/77   Pulse 80   Ht 5\' 4"  (1.626 m)   Wt 269 lb (122 kg)   LMP 03/22/2018   BMI 46.17 kg/m  General:  alert  Skin:   no rash or abnormalities  Lungs:   clear to auscultation bilaterally  Heart:   regular rate and rhythm, S1, S2 normal, no murmur, click, rub or gallop  Breasts:   normal without suspicious masses, skin or nipple changes or axillary nodes  Abdomen:  normal findings: no organomegaly, soft, non-tender and no hernia  Pelvis:  External genitalia: normal general appearance Urinary system: urethral meatus normal and bladder without fullness,  nontender Vaginal: normal without tenderness, induration or masses Cervix: normal appearance Adnexa: normal bimanual exam Uterus: anteverted and non-tender, normal size   Lab Review Urine pregnancy test Labs reviewed yes Radiologic studies reviewed no  50% of 20 min visit spent on counseling and coordination of care.   Assessment:     1. Encounter for routine gynecological examination with Papanicolaou smear of cervix Rx: - Cytology - PAP( [facility])  2. Vaginal discharge Rx: - Cervicovaginal ancillary only( [facility])  3. Encounter for surveillance of contraceptive pills Rx: - norethindrone (MICRONOR,CAMILA,ERRIN) 0.35 MG tablet; Take 1 tablet (0.35 mg total) by mouth daily.  Dispense: 28 tablet; Refill: 11   Plan:    Education reviewed: calcium supplements, depression evaluation, low fat, low cholesterol diet, safe sex/STD prevention, self breast exams and weight bearing exercise. Contraception: oral progesterone-only contraceptive. Follow up in: 1 year.   Meds ordered this encounter  Medications  . norethindrone (MICRONOR,CAMILA,ERRIN) 0.35 MG tablet    Sig: Take 1 tablet (0.35 mg total) by mouth daily.    Dispense:  28 tablet    Refill:  11    Please consider 90 day supplies to promote better adherence   No orders of the defined types were placed in this encounter.   Shelly Bombard MD 04-13-2018

## 2018-04-16 LAB — CYTOLOGY - PAP
Diagnosis: NEGATIVE
HPV: NOT DETECTED

## 2018-04-16 LAB — CERVICOVAGINAL ANCILLARY ONLY
Bacterial vaginitis: POSITIVE — AB
Candida vaginitis: POSITIVE — AB
Chlamydia: NEGATIVE
Neisseria Gonorrhea: NEGATIVE
Trichomonas: NEGATIVE

## 2018-04-19 ENCOUNTER — Other Ambulatory Visit: Payer: Self-pay | Admitting: Obstetrics

## 2018-04-19 DIAGNOSIS — B9689 Other specified bacterial agents as the cause of diseases classified elsewhere: Secondary | ICD-10-CM

## 2018-04-19 DIAGNOSIS — B373 Candidiasis of vulva and vagina: Secondary | ICD-10-CM

## 2018-04-19 DIAGNOSIS — N760 Acute vaginitis: Secondary | ICD-10-CM

## 2018-04-19 DIAGNOSIS — B3731 Acute candidiasis of vulva and vagina: Secondary | ICD-10-CM

## 2018-04-19 MED ORDER — TINIDAZOLE 500 MG PO TABS: 1000.0000 mg | ORAL_TABLET | Freq: Every day | ORAL | 2 refills | Status: DC

## 2018-04-19 MED ORDER — FLUCONAZOLE 150 MG PO TABS: 150.0000 mg | ORAL_TABLET | Freq: Once | ORAL | 0 refills | Status: AC

## 2018-06-18 ENCOUNTER — Encounter (HOSPITAL_COMMUNITY): Payer: Self-pay

## 2018-06-18 ENCOUNTER — Emergency Department (HOSPITAL_COMMUNITY)
Admission: EM | Admit: 2018-06-18 | Discharge: 2018-06-18 | Disposition: A | Discharge status: Home / Self Care | Payer: BC Managed Care – PPO | Attending: Emergency Medicine | Admitting: Emergency Medicine

## 2018-06-18 ENCOUNTER — Other Ambulatory Visit: Payer: Self-pay

## 2018-06-18 ENCOUNTER — Emergency Department (HOSPITAL_COMMUNITY): Payer: BC Managed Care – PPO

## 2018-06-18 DIAGNOSIS — R072 Precordial pain: Secondary | ICD-10-CM | POA: Insufficient documentation

## 2018-06-18 DIAGNOSIS — R079 Chest pain, unspecified: Secondary | ICD-10-CM | POA: Diagnosis present

## 2018-06-18 DIAGNOSIS — Z79899 Other long term (current) drug therapy: Secondary | ICD-10-CM | POA: Diagnosis not present

## 2018-06-18 DIAGNOSIS — R0789 Other chest pain: Secondary | ICD-10-CM

## 2018-06-18 LAB — CBC
HCT: 35.1 % — ABNORMAL LOW (ref 36.0–46.0)
Hemoglobin: 11 g/dL — ABNORMAL LOW (ref 12.0–15.0)
MCH: 26.9 pg (ref 26.0–34.0)
MCHC: 31.3 g/dL (ref 30.0–36.0)
MCV: 85.8 fL (ref 80.0–100.0)
Platelets: 284 10*3/uL (ref 150–400)
RBC: 4.09 MIL/uL (ref 3.87–5.11)
RDW: 14.6 % (ref 11.5–15.5)
WBC: 7.3 10*3/uL (ref 4.0–10.5)
nRBC: 0 % (ref 0.0–0.2)

## 2018-06-18 LAB — BASIC METABOLIC PANEL
Anion gap: 8 (ref 5–15)
BUN: 10 mg/dL (ref 6–20)
CO2: 24 mmol/L (ref 22–32)
Calcium: 9 mg/dL (ref 8.9–10.3)
Chloride: 106 mmol/L (ref 98–111)
Creatinine, Ser: 0.94 mg/dL (ref 0.44–1.00)
GFR calc Af Amer: >60 mL/min (ref >60)
GFR calc non Af Amer: >60 mL/min (ref >60)
Glucose, Bld: 94 mg/dL (ref 70–99)
Potassium: 3.6 mmol/L (ref 3.5–5.1)
Sodium: 138 mmol/L (ref 135–145)

## 2018-06-18 LAB — I-STAT TROPONIN, ED: Troponin i, poc: 0 ng/mL (ref 0.00–0.08)

## 2018-06-18 LAB — I-STAT BETA HCG BLOOD, ED (MC, WL, AP ONLY): I-stat hCG, quantitative: <5 m[IU]/mL (ref <5)

## 2018-06-18 MED ORDER — METHOCARBAMOL 500 MG PO TABS
750.0000 mg | ORAL_TABLET | Freq: Once | ORAL | Status: AC
  Administered 2018-06-18: 750 mg via ORAL
  Filled 2018-06-18: qty 2

## 2018-06-18 MED ORDER — METHOCARBAMOL 750 MG PO TABS: 750.0000 mg | ORAL_TABLET | Freq: Three times a day (TID) | ORAL | 0 refills | Status: DC | PRN

## 2018-06-18 MED ORDER — SODIUM CHLORIDE 0.9% FLUSH: 3.0000 mL | Freq: Once | INTRAVENOUS | Status: DC

## 2018-06-18 MED ORDER — IBUPROFEN 400 MG PO TABS
400.0000 mg | ORAL_TABLET | Freq: Once | ORAL | Status: AC
  Administered 2018-06-18: 400 mg via ORAL
  Filled 2018-06-18: qty 1

## 2018-06-18 NOTE — ED Triage Notes (Signed)
Pt reports chest pain wrapping around to her back that started today. Pt denies any associated sx.

## 2018-06-18 NOTE — ED Notes (Signed)
Patient verbalizes understanding of discharge instructions. Opportunity for questioning and answers were provided. Armband removed by staff, pt discharged from ED ambulatory w/  Friend

## 2018-06-18 NOTE — Discharge Instructions (Addendum)
It was our pleasure to provide your ER care today - we hope that you feel better.  Take acetaminophen and/or ibuprofen as need for pain. You may also take robaxin as need for muscle pain/spasm - no driving when taking.   Follow up with primary care doctor in the coming week.  Return to ER if worse, new symptoms, persistent/recurrent chest pain, difficulty breathing, high fevers, other concern.

## 2018-06-18 NOTE — ED Provider Notes (Signed)
[facility] EMERGENCY DEPARTMENT Provider Note   CSN: [id number] Arrival date & time: 06/18/18  1638    History   Chief Complaint Chief Complaint  Patient presents with  . Chest Pain    HPI Felicia Pham is a 38 y.o. female.     Patient c/o pain to mid chest, and back pain since yesterday. Pain is constant, dull to sharp, moderate, worse w certain movement, positional changes. Pain is not pleuritic. Denies chest/back injury or strain. No heartburn. Symptoms occur at rest. No relation to activity or exertion. No relation to eating. Denies associated sob, nv or diaphoresis. No unusual doe or fatigue. No leg pain or swelling. No immobility, trauma, recent surgery. Denies cough or uri symptoms. No fever or chills. No fam hx premature cad.   The history is provided by the patient.  Chest Pain  Associated symptoms: no abdominal pain, no cough, no fever, no headache, no nausea, no shortness of breath and no vomiting     Past Medical History:  Diagnosis Date  . Uterine fibroid     Patient Active Problem List   Diagnosis Date Noted  . Lower abdominal pain 03/30/2018  . Abnormal glucose 03/30/2018  . Acute bronchitis 03/30/2018  . Acute pharyngitis, unspecified 03/30/2018  . Backache 03/30/2018  . Chest pain 03/30/2018  . Diarrhea, unspecified 03/30/2018  . Dizziness and giddiness 03/30/2018  . Generalized muscle weakness 03/30/2018  . Goiter 03/30/2018  . Malignant essential hypertension 03/30/2018  . Mixed hyperlipidemia 03/30/2018  . Body mass index 37.0-37.9, adult 03/30/2018  . Nausea 03/30/2018  . Other long term (current) drug therapy 03/30/2018  . Other malaise and fatigue 03/30/2018  . Pain in left leg 03/30/2018  . Gastroesophageal reflux disease 06/09/2017  . 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 Myalgia and myositis 05/26/2013  . Pain in  joint, shoulder region 05/26/2013  . Other iron deficiency anemias 02/25/2013  . Pelvic pain in female 11/17/2012  . Other and unspecified ovarian cyst 11/02/2012  . Leiomyoma of uterus, unspecified 08/05/2012  . Impaired fasting glucose 01/31/2010  . Need for prophylactic vaccination and inoculation against influenza 01/31/2010    Past Surgical History:  Procedure Laterality Date  . MYOMECTOMY  2014     OB History    Gravida  0   Para  0   Term  0   Preterm  0   AB  0   Living  0     SAB  0   TAB  0   Ectopic  0   Multiple  0   Live Births               Home Medications    Prior to Admission medications   Medication Sig Start Date End Date Taking? Authorizing Provider  hydrocortisone 2.5 % cream  01/31/18   [provider]  ibuprofen (ADVIL,MOTRIN) 200 MG tablet Take 600-800 mg by mouth every 6 (six) hours as needed. Reported on 09/25/2015    [provider]  [facility]  Onychomycosis Nail Lacquer -  Fluconazole 2%, Terbinafine 1% DMSO Apply to affected nail once daily Qty. 120 gm 3 refills    [provider]  norethindrone (MICRONOR,CAMILA,ERRIN) 0.35 MG tablet Take 1 tablet (0.35 mg total) by mouth daily. 04/13/18   Shelly Bombard, MD  ondansetron (ZOFRAN) 4 MG tablet Take 1 tablet (4 mg total)  by mouth every 8 (eight) hours as needed for nausea or vomiting. 03/31/18 03/31/19  Rodriguez-Southworth, Sunday Spillers, PA-C  tinidazole (TINDAMAX) 500 MG tablet Take 2 tablets (1,000 mg total) by mouth daily with breakfast. 04/19/18   Shelly Bombard, MD    Family History Family History  Problem Relation Age of Onset  . Lupus Mother   . Hypertension Mother   . Hypertension Father   . Diabetes Father   . Heart attack Father 59  . Diabetes Maternal Grandmother   . Diabetes Paternal Grandmother     Social History Social History   Tobacco Use  . Smoking status: Never Smoker  . Smokeless tobacco: Never Used   Substance Use Topics  . Alcohol use: No  . Drug use: No     Allergies   Patient has no known allergies.   Review of Systems Review of Systems  Constitutional: Negative for fever.  HENT: Negative for sore throat.   Eyes: Negative for redness.  Respiratory: Negative for cough and shortness of breath.   Cardiovascular: Positive for chest pain.  Gastrointestinal: Negative for abdominal pain, nausea and vomiting.  Genitourinary: Negative for dysuria and flank pain.  Musculoskeletal: Negative for neck pain.  Skin: Negative for rash.  Neurological: Negative for headaches.  Hematological: Does not bruise/bleed easily.  Psychiatric/Behavioral: Negative for confusion.     Physical Exam Updated Vital Signs BP 139/89 (BP Location: Right Wrist)   Pulse 91   Temp (!) 97.4 F (36.3 C) (Oral)   Resp 18   LMP 05/24/2018 (Approximate)   SpO2 100%   Physical Exam Vitals signs and nursing note reviewed.  Constitutional:      Appearance: Normal appearance. She is well-developed.  HENT:     Head: Atraumatic.     Nose: Nose normal.     Mouth/Throat:     Mouth: Mucous membranes are moist.  Eyes:     General: No scleral icterus.    Conjunctiva/sclera: Conjunctivae normal.     Pupils: Pupils are equal, round, and reactive to light.  Neck:     Musculoskeletal: Normal range of motion and neck supple. No neck rigidity or muscular tenderness.     Trachea: No tracheal deviation.  Cardiovascular:     Rate and Rhythm: Normal rate and regular rhythm.     Pulses: Normal pulses.     Heart sounds: Normal heart sounds. No murmur. No friction rub. No gallop.   Pulmonary:     Effort: Pulmonary effort is normal. No respiratory distress.     Breath sounds: Normal breath sounds.     Comments: Pain reproduced w palp chest wall, movement on stretcher (sitting from lying, lying from sitting).  Chest:     Chest wall: Tenderness present.  Abdominal:     General: Bowel sounds are normal. There is no  distension.     Palpations: Abdomen is soft.     Tenderness: There is no abdominal tenderness. There is no guarding.  Genitourinary:    Comments: No cva tenderness.  Musculoskeletal:        General: No swelling or tenderness.     Right lower leg: No edema.     Left lower leg: No edema.     Comments: T/L spine non tender, aligned, no step off. Right mid to upper back tenderness/muscle spasm around right scapula. No skin changes or sts noted.   Skin:    General: Skin is warm and dry.     Findings: No rash.  Neurological:  Mental Status: She is alert.     Comments: Alert, speech normal.   Psychiatric:        Mood and Affect: Mood normal.      ED Treatments / Results  Labs (all labs ordered are listed, but only abnormal results are displayed) Results for orders placed or performed during the hospital encounter of 15/17/61  Basic metabolic panel  Result Value Ref Range   Sodium 138 135 - 145 mmol/L   Potassium 3.6 3.5 - 5.1 mmol/L   Chloride 106 98 - 111 mmol/L   CO2 24 22 - 32 mmol/L   Glucose, Bld 94 70 - 99 mg/dL   BUN 10 6 - 20 mg/dL   Creatinine, Ser 0.94 0.44 - 1.00 mg/dL   Calcium 9.0 8.9 - 10.3 mg/dL   GFR calc non Af Amer >60 >60 mL/min   GFR calc Af Amer >60 >60 mL/min   Anion gap 8 5 - 15  CBC  Result Value Ref Range   WBC 7.3 4.0 - 10.5 K/uL   RBC 4.09 3.87 - 5.11 MIL/uL   Hemoglobin 11.0 (L) 12.0 - 15.0 g/dL   HCT 35.1 (L) 36.0 - 46.0 %   MCV 85.8 80.0 - 100.0 fL   MCH 26.9 26.0 - 34.0 pg   MCHC 31.3 30.0 - 36.0 g/dL   RDW 14.6 11.5 - 15.5 %   Platelets 284 150 - 400 K/uL   nRBC 0.0 0.0 - 0.2 %  I-stat troponin, ED  Result Value Ref Range   Troponin i, poc 0.00 0.00 - 0.08 ng/mL   Comment 3          I-Stat beta hCG blood, ED  Result Value Ref Range   I-stat hCG, quantitative <5.0 <5 mIU/mL   Comment 3           Dg Chest 2 View  Result Date: 06/18/2018 CLINICAL DATA:  Right sided chest pain EXAM: CHEST - 2 VIEW COMPARISON:  CXR 07/18/2017  FINDINGS: The heart size and mediastinal contours are within normal limits. Both lungs are clear. The visualized skeletal structures are unremarkable. IMPRESSION: No active cardiopulmonary disease. Electronically Signed   By: Ashley Royalty M.D.   On: 06/18/2018 18:52    EKG EKG Interpretation  Date/Time:  Friday June 18 2018 16:43:21 EST Ventricular Rate:  88 PR Interval:  118 QRS Duration: 88 QT Interval:  330 QTC Calculation: 399 R Axis:   50 Text Interpretation:  Normal sinus rhythm with short PR Nonspecific T wave abnormality Confirmed by Lajean Saver [phone]) on 06/18/2018 8:20:22 PM   Radiology Dg Chest 2 View  Result Date: 06/18/2018 CLINICAL DATA:  Right sided chest pain EXAM: CHEST - 2 VIEW COMPARISON:  CXR 07/18/2017 FINDINGS: The heart size and mediastinal contours are within normal limits. Both lungs are clear. The visualized skeletal structures are unremarkable. IMPRESSION: No active cardiopulmonary disease. Electronically Signed   By: Ashley Royalty M.D.   On: 06/18/2018 18:52    Procedures Procedures (including critical care time)  Medications Ordered in ED Medications  sodium chloride flush (NS) 0.9 % injection 3 mL (has no administration in time range)  ibuprofen (ADVIL,MOTRIN) tablet 400 mg (has no administration in time range)  methocarbamol (ROBAXIN) tablet 750 mg (has no administration in time range)     Initial Impression / Assessment and Plan / ED Course  I have reviewed the triage vital signs and the nursing notes.  Pertinent labs & imaging results that were available during my  care of the patient were reviewed by me and considered in my medical decision making (see chart for details).  Labs. Ecg. Cxr.  Reviewed nursing notes and prior charts for additional history.   Labs reviewed - trop is normal.   cxr reviewed - no pna.   After symptoms constant for pain 24 hours, trop is normal/0. Exam appears most c/w msk pain.   Motrin po. Robaxin po.    Patient currently appears stable for d/c.     Final Clinical Impressions(s) / ED Diagnoses   Final diagnoses:  None    ED Discharge Orders    None       Lajean Saver, MD 06/18/18 2054

## 2018-09-30 ENCOUNTER — Ambulatory Visit: Payer: BC Managed Care – PPO | Admitting: Nurse Practitioner

## 2018-10-01 ENCOUNTER — Telehealth: Payer: Self-pay

## 2018-10-01 NOTE — Telephone Encounter (Signed)
LVM for pt to call to reschedule missed appt 10/01/18

## 2019-04-05 ENCOUNTER — Encounter: Payer: BC Managed Care – PPO | Admitting: Nurse Practitioner

## 2019-04-05 ENCOUNTER — Encounter: Payer: BC Managed Care – PPO | Admitting: Internal Medicine

## 2019-04-14 ENCOUNTER — Encounter: Payer: BC Managed Care – PPO | Admitting: Nurse Practitioner

## 2019-04-19 ENCOUNTER — Ambulatory Visit (INDEPENDENT_AMBULATORY_CARE_PROVIDER_SITE_OTHER): Payer: BC Managed Care – PPO | Admitting: Obstetrics

## 2019-04-19 ENCOUNTER — Encounter: Payer: Self-pay | Admitting: Obstetrics

## 2019-04-19 ENCOUNTER — Other Ambulatory Visit: Payer: Self-pay

## 2019-04-19 VITALS — BP 128/83 | Ht 64.0 in | Wt 284.0 lb

## 2019-04-19 DIAGNOSIS — Z01419 Encounter for gynecological examination (general) (routine) without abnormal findings: Secondary | ICD-10-CM

## 2019-04-19 DIAGNOSIS — R350 Frequency of micturition: Secondary | ICD-10-CM

## 2019-04-19 DIAGNOSIS — Z6841 Body Mass Index (BMI) 40.0 and over, adult: Secondary | ICD-10-CM

## 2019-04-19 DIAGNOSIS — Z1151 Encounter for screening for human papillomavirus (HPV): Secondary | ICD-10-CM | POA: Diagnosis not present

## 2019-04-19 DIAGNOSIS — N760 Acute vaginitis: Secondary | ICD-10-CM

## 2019-04-19 DIAGNOSIS — Z124 Encounter for screening for malignant neoplasm of cervix: Secondary | ICD-10-CM | POA: Diagnosis not present

## 2019-04-19 DIAGNOSIS — Z3041 Encounter for surveillance of contraceptive pills: Secondary | ICD-10-CM

## 2019-04-19 DIAGNOSIS — Z113 Encounter for screening for infections with a predominantly sexual mode of transmission: Secondary | ICD-10-CM | POA: Diagnosis not present

## 2019-04-19 DIAGNOSIS — N898 Other specified noninflammatory disorders of vagina: Secondary | ICD-10-CM | POA: Diagnosis not present

## 2019-04-19 DIAGNOSIS — B9689 Other specified bacterial agents as the cause of diseases classified elsewhere: Secondary | ICD-10-CM

## 2019-04-19 LAB — POCT URINALYSIS DIPSTICK
Bilirubin, UA: NEGATIVE
Glucose, UA: NEGATIVE
Ketones, UA: NEGATIVE
Leukocytes, UA: NEGATIVE
Nitrite, UA: NEGATIVE
Protein, UA: NEGATIVE
Spec Grav, UA: 1.02 (ref 1.010–1.025)
Urobilinogen, UA: 0.2 E.U./dL
pH, UA: 6 (ref 5.0–8.0)

## 2019-04-19 MED ORDER — NORETHINDRONE 0.35 MG PO TABS: 1.0000 | ORAL_TABLET | Freq: Every day | ORAL | 11 refills | Status: DC

## 2019-04-19 NOTE — Progress Notes (Addendum)
Subjective:        Felicia Pham is a 39 y.o. female here for a routine exam.  Current complaints: None.    Personal health questionnaire:  Is patient Felicia Pham, have a family history of breast and/or ovarian cancer: no Is there a family history of uterine cancer diagnosed at age < 80, gastrointestinal cancer, urinary tract cancer, family member who is a Field seismologist syndrome-associated carrier: yes Is the patient overweight and hypertensive, family history of diabetes, personal history of gestational diabetes, preeclampsia or PCOS: yes Is patient over 73, have PCOS,  family history of premature CHD under age 9, diabetes, smoke, have hypertension or peripheral artery disease:  no At any time, has a partner hit, kicked or otherwise hurt or frightened you?: no Over the past 2 weeks, have you felt down, depressed or hopeless?: no Over the past 2 weeks, have you felt little interest or pleasure in doing things?:no   Gynecologic History Patient's last menstrual period was 03/26/2019. Contraception: oral progesterone-only contraceptive Last Pap: 04-13-2018. Results were: normal Last mammogram: n/a. Results were: n/a  Obstetric History OB History  Gravida Para Term Preterm AB Living  0 0 0 0 0 0  SAB TAB Ectopic Multiple Live Births  0 0 0 0      Past Medical History:  Diagnosis Date  . Uterine fibroid     Past Surgical History:  Procedure Laterality Date  . MYOMECTOMY  2014     Current Outpatient Medications:  .  ibuprofen (ADVIL,MOTRIN) 200 MG tablet, Take 600-800 mg by mouth every 6 (six) hours as needed. Reported on 09/25/2015, Disp: , Rfl:  .  norethindrone (Felicia Pham) 0.35 MG tablet, Take 1 tablet (0.35 mg total) by mouth daily., Disp: 28 tablet, Rfl: 11 .  hydrocortisone 2.5 % cream, , Disp: , Rfl: 2 .  methocarbamol (ROBAXIN) 750 MG tablet, Take 1 tablet (750 mg total) by mouth 3 (three) times daily as needed (muscle spasm/pain)., Disp: 15 tablet, Rfl: 0 .  NON  FORMULARY, [facility]  Onychomycosis Nail Lacquer -  Fluconazole 2%, Terbinafine 1% DMSO Apply to affected nail once daily Qty. 120 gm 3 refills, Disp: , Rfl:  No Known Allergies  Social History   Tobacco Use  . Smoking status: Never Smoker  . Smokeless tobacco: Never Used  Substance Use Topics  . Alcohol use: No    Family History  Problem Relation Age of Onset  . Lupus Mother   . Hypertension Mother   . Hypertension Father   . Diabetes Father   . Heart attack Father 49  . Diabetes Maternal Grandmother   . Diabetes Paternal Grandmother       Review of Systems  Constitutional: negative for fatigue and weight loss Respiratory: negative for cough and wheezing Cardiovascular: negative for chest pain, fatigue and palpitations Gastrointestinal: negative for abdominal pain and change in bowel habits Musculoskeletal:negative for myalgias Neurological: negative for gait problems and tremors Behavioral/Psych: negative for abusive relationship, depression Endocrine: negative for temperature intolerance    Genitourinary:negative for abnormal menstrual periods, genital lesions, hot flashes, sexual problems and vaginal discharge Integument/breast: negative for breast lump, breast tenderness, nipple discharge and skin lesion(s)    Objective:       BP 128/83   Ht 5\' 4"  (1.626 m)   Wt 284 lb (128.8 kg)   LMP 03/26/2019   BMI 48.75 kg/m  General:   alert  Skin:   no rash or abnormalities  Lungs:   clear to auscultation  bilaterally  Heart:   regular rate and rhythm, S1, S2 normal, no murmur, click, rub or gallop  Breasts:   normal without suspicious masses, skin or nipple changes or axillary nodes  Abdomen:  normal findings: no organomegaly, soft, non-tender and no hernia  Pelvis:  External genitalia: normal general appearance Urinary system: urethral meatus normal and bladder without fullness, nontender Vaginal: normal without tenderness, induration or masses Cervix: normal  appearance Adnexa: normal bimanual exam Uterus: anteverted and non-tender, normal size   Lab Review Urine pregnancy test Labs reviewed yes Radiologic studies reviewed no  50% of 25 min visit spent on counseling and coordination of care.   Assessment:     1. Encounter for routine gynecological examination with Papanicolaou smear of cervix Rx: - Cytology - PAP( [name])  2. Vaginal discharge Rx: - Cervicovaginal ancillary only( [name])  3. Screen for STD (sexually transmitted disease) Rx: - HIV antibody - Hepatitis B surface antigen - RPR - Hepatitis C antibody  4. Urinary frequency Rx: - Urine culture  5. Encounter for surveillance of contraceptive pills Rx: - norethindrone (Felicia Pham) 0.35 MG tablet; Take 1 tablet (0.35 mg total) by mouth daily.  Dispense: 28 tablet; Refill: 11  6. Class 3 severe obesity due to excess calories without serious comorbidity with body mass index (BMI) of 45.0 to 49.9 in adult [facility]) - program of caloric restriction, exercise and behavioral modification recommended    Plan:    Education reviewed: calcium supplements, depression evaluation, low fat, low cholesterol diet, safe sex/STD prevention, self breast exams and weight bearing exercise. Contraception: oral progesterone-only contraceptive. Follow up in: 1 year.   Meds ordered this encounter  Medications  . norethindrone (Felicia Pham) 0.35 MG tablet    Sig: Take 1 tablet (0.35 mg total) by mouth daily.    Dispense:  28 tablet    Refill:  11    Please consider 90 day supplies to promote better adherence   Orders Placed This Encounter  Procedures  . Urine culture  . HIV antibody  . Hepatitis B surface antigen  . RPR  . Hepatitis C antibody     Shelly Bombard, MD 04/19/2019 10:26 AM

## 2019-04-19 NOTE — Addendum Note (Signed)
Addended by: Lewie Loron D on: 04/19/2019 11:57 AM   Modules accepted: Orders

## 2019-04-19 NOTE — Addendum Note (Signed)
Addended by: Baltazar Najjar A on: 04/19/2019 10:28 AM   Modules accepted: Orders

## 2019-04-20 ENCOUNTER — Other Ambulatory Visit: Payer: Self-pay | Admitting: Obstetrics

## 2019-04-20 DIAGNOSIS — B9689 Other specified bacterial agents as the cause of diseases classified elsewhere: Secondary | ICD-10-CM

## 2019-04-20 DIAGNOSIS — N760 Acute vaginitis: Secondary | ICD-10-CM

## 2019-04-20 LAB — CERVICOVAGINAL ANCILLARY ONLY
Bacterial Vaginitis (gardnerella): POSITIVE — AB
Candida Glabrata: NEGATIVE
Candida Vaginitis: NEGATIVE
Chlamydia: NEGATIVE
Comment: NEGATIVE
Comment: NEGATIVE
Comment: NEGATIVE
Comment: NEGATIVE
Comment: NEGATIVE
Comment: NORMAL
Neisseria Gonorrhea: NEGATIVE
Trichomonas: NEGATIVE

## 2019-04-20 LAB — HEPATITIS C ANTIBODY: Hep C Virus Ab: 0.1 s/co ratio (ref 0.0–0.9)

## 2019-04-20 LAB — HEPATITIS B SURFACE ANTIGEN: Hepatitis B Surface Ag: NEGATIVE

## 2019-04-20 LAB — HIV ANTIBODY (ROUTINE TESTING W REFLEX): HIV Screen 4th Generation wRfx: NONREACTIVE

## 2019-04-20 LAB — RPR: RPR Ser Ql: NONREACTIVE

## 2019-04-20 MED ORDER — TINIDAZOLE 500 MG PO TABS: 1000.0000 mg | ORAL_TABLET | Freq: Every day | ORAL | 2 refills | Status: DC

## 2019-04-21 LAB — URINE CULTURE: Organism ID, Bacteria: NO GROWTH

## 2019-04-22 LAB — CYTOLOGY - PAP
Comment: NEGATIVE
Diagnosis: NEGATIVE
High risk HPV: NEGATIVE

## 2019-08-16 ENCOUNTER — Emergency Department (HOSPITAL_COMMUNITY)
Admission: EM | Admit: 2019-08-16 | Discharge: 2019-08-16 | Disposition: A | Discharge status: Home / Self Care | Payer: BC Managed Care – PPO | Attending: Emergency Medicine | Admitting: Emergency Medicine

## 2019-08-16 ENCOUNTER — Encounter (HOSPITAL_COMMUNITY): Payer: Self-pay | Admitting: Emergency Medicine

## 2019-08-16 ENCOUNTER — Emergency Department (HOSPITAL_COMMUNITY): Payer: BC Managed Care – PPO

## 2019-08-16 ENCOUNTER — Other Ambulatory Visit: Payer: Self-pay

## 2019-08-16 DIAGNOSIS — S2231XA Fracture of one rib, right side, initial encounter for closed fracture: Secondary | ICD-10-CM

## 2019-08-16 DIAGNOSIS — Y929 Unspecified place or not applicable: Secondary | ICD-10-CM | POA: Diagnosis not present

## 2019-08-16 DIAGNOSIS — Y939 Activity, unspecified: Secondary | ICD-10-CM | POA: Diagnosis not present

## 2019-08-16 DIAGNOSIS — R519 Headache, unspecified: Secondary | ICD-10-CM | POA: Insufficient documentation

## 2019-08-16 DIAGNOSIS — Y999 Unspecified external cause status: Secondary | ICD-10-CM | POA: Diagnosis not present

## 2019-08-16 DIAGNOSIS — R0789 Other chest pain: Secondary | ICD-10-CM | POA: Diagnosis present

## 2019-08-16 MED ORDER — LIDOCAINE 5 % EX PTCH: 1.0000 | MEDICATED_PATCH | CUTANEOUS | 0 refills | Status: DC

## 2019-08-16 MED ORDER — CYCLOBENZAPRINE HCL 10 MG PO TABS: 10.0000 mg | ORAL_TABLET | Freq: Two times a day (BID) | ORAL | 0 refills | Status: DC | PRN

## 2019-08-16 MED ORDER — ACETAMINOPHEN 500 MG PO TABS
500.0000 mg | ORAL_TABLET | Freq: Once | ORAL | Status: AC
  Administered 2019-08-16: 17:00:00 500 mg via ORAL
  Filled 2019-08-16: qty 1
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
## 2019-08-16 NOTE — ED Notes (Signed)
Patient verbalizes understanding of discharge instructions. Opportunity for questioning and answers were provided. Armband removed by staff, pt discharged from ED ambulatory.   
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
## 2019-08-16 NOTE — ED Notes (Signed)
Pt transported to Xray. 
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
## 2019-11-11 ENCOUNTER — Ambulatory Visit (HOSPITAL_COMMUNITY)
Admission: EM | Admit: 2019-11-11 | Discharge: 2019-11-11 | Disposition: A | Discharge status: Home / Self Care | Payer: BC Managed Care – PPO

## 2019-11-11 ENCOUNTER — Other Ambulatory Visit: Payer: Self-pay

## 2019-11-11 ENCOUNTER — Encounter (HOSPITAL_COMMUNITY): Payer: Self-pay

## 2019-11-11 DIAGNOSIS — L509 Urticaria, unspecified: Secondary | ICD-10-CM

## 2019-11-11 HISTORY — DX: Fracture of one rib, unspecified side, initial encounter for closed fracture: S22.39XA

## 2019-11-11 MED ORDER — CETIRIZINE HCL 10 MG PO TABS: 10.0000 mg | ORAL_TABLET | Freq: Every day | ORAL | 0 refills | Status: DC

## 2019-11-11 MED ORDER — FAMOTIDINE 20 MG PO TABS: 20.0000 mg | ORAL_TABLET | Freq: Two times a day (BID) | ORAL | 0 refills | Status: DC

## 2019-11-11 MED ORDER — PREDNISONE 10 MG PO TABS: 40.0000 mg | ORAL_TABLET | Freq: Every day | ORAL | 0 refills | Status: AC

## 2019-11-11 NOTE — ED Triage Notes (Signed)
Pt c/o hives x 5 hours, unknown new exposures. Pt took benadryl 1 hour ago. Denies shortness of breath

## 2019-11-11 NOTE — ED Provider Notes (Signed)
[facility]    CSN: [id number] Arrival date & time: 11/11/19  1725      History   Chief Complaint Chief Complaint  Patient presents with  . Rash    HPI Felicia Pham is a 39 y.o. female.   Patient presents for itchy rash that developed earlier today.  She believes they are hives.  This developed around 5 hours prior to arrival.  She took 1 Benadryl without much improvement.  She denies any shortness of breath with her tongue.  She is unsure of what triggered this.  No new exposures to her knowledge to include soaps, detergents and skin care products..  She does not recall ever having a similar outbreak.     Past Medical History:  Diagnosis Date  . Rib fracture   . Uterine fibroid     Patient Active Problem List   Diagnosis Date Noted  . Lower abdominal pain 03/30/2018  . Abnormal glucose 03/30/2018  . Acute bronchitis 03/30/2018  . Acute pharyngitis, unspecified 03/30/2018  . Backache 03/30/2018  . Chest pain 03/30/2018  . Diarrhea, unspecified 03/30/2018  . Dizziness and giddiness 03/30/2018  . Generalized muscle weakness 03/30/2018  . Goiter 03/30/2018  . Malignant essential hypertension 03/30/2018  . Mixed hyperlipidemia 03/30/2018  . Body mass index 37.0-37.9, adult 03/30/2018  . Nausea 03/30/2018  . Other long term (current) drug therapy 03/30/2018  . Other malaise and fatigue 03/30/2018  . Pain in left leg 03/30/2018  . Gastroesophageal reflux disease 06/09/2017  . 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 Myalgia and myositis 05/26/2013  . Pain in joint, shoulder region 05/26/2013  . Other iron deficiency anemias 02/25/2013  . Pelvic pain in female 11/17/2012  . Other and unspecified ovarian cyst 11/02/2012  . Leiomyoma of uterus, unspecified 08/05/2012  . Impaired fasting glucose 01/31/2010  . Need for prophylactic vaccination and inoculation  against influenza 01/31/2010    Past Surgical History:  Procedure Laterality Date  . MYOMECTOMY  2014    OB History    Gravida  0   Para  0   Term  0   Preterm  0   AB  0   Living  0     SAB  0   TAB  0   Ectopic  0   Multiple  0   Live Births               Home Medications    Prior to Admission medications   Medication Sig Start Date End Date Taking? Authorizing Provider  BACLOFEN PO Take by mouth.   Yes [provider]  cetirizine (ZYRTEC ALLERGY) 10 MG tablet Take 1 tablet (10 mg total) by mouth daily. 11/11/19   Oda Lansdowne, Marguerita Beards, PA-C  cyclobenzaprine (FLEXERIL) 10 MG tablet Take 1 tablet (10 mg total) by mouth 2 (two) times daily as needed for muscle spasms. 08/16/19   Henderly, Britni A, PA-C  famotidine (PEPCID) 20 MG tablet Take 1 tablet (20 mg total) by mouth 2 (two) times daily. 11/11/19   Calix Heinbaugh, Marguerita Beards, PA-C  hydrocortisone 2.5 % cream Apply 1 application topically daily.  01/31/18   [provider]  ibuprofen (ADVIL,MOTRIN) 200 MG tablet Take 600-800 mg by mouth every 6 (six) hours as needed for moderate pain. Reported on 09/25/2015    [provider]  lidocaine (LIDODERM) 5 % Place 1 patch onto the skin daily.  Remove & Discard patch within 12 hours or as directed by MD 08/16/19   Henderly, Britni A, PA-C  norethindrone (MICRONOR) 0.35 MG tablet Take 1 tablet (0.35 mg total) by mouth daily. 04/19/19   Shelly Bombard, MD  predniSONE (DELTASONE) 10 MG tablet Take 4 tablets (40 mg total) by mouth daily for 3 days. 11/11/19 11/14/19  Verlee Pope, Marguerita Beards, PA-C    Family History Family History  Problem Relation Age of Onset  . Lupus Mother   . Hypertension Mother   . Hypertension Father   . Diabetes Father   . Heart attack Father 95  . Diabetes Maternal Grandmother   . Diabetes Paternal Grandmother     Social History Social History   Tobacco Use  . Smoking status: Never Smoker  . Smokeless tobacco: Never Used  Vaping Use  . Vaping  Use: Never used  Substance Use Topics  . Alcohol use: No  . Drug use: No     Allergies   Patient has no known allergies.   Review of Systems Review of Systems   Physical Exam Triage Vital Signs ED Triage Vitals [11/11/19 1801]  Enc Vitals Group     BP (!) 143/95     Pulse Rate 93     Resp 16     Temp 98.1 F (36.7 C)     Temp src      SpO2 99 %     Weight      Height      Head Circumference      Peak Flow      Pain Score      Pain Loc      Pain Edu?      Excl. in [place]?    No data found.  Updated Vital Signs BP (!) 143/95   Pulse 93   Temp 98.1 F (36.7 C)   Resp 16   LMP 11/11/2019   SpO2 99%   Visual Acuity Right Eye Distance:   Left Eye Distance:   Bilateral Distance:    Right Eye Near:   Left Eye Near:    Bilateral Near:     Physical Exam Vitals and nursing note reviewed.  Constitutional:      General: She is not in acute distress.    Appearance: Normal appearance. She is well-developed. She is not ill-appearing.  HENT:     Head: Normocephalic and atraumatic.     Mouth/Throat:     Mouth: Mucous membranes are moist.     Comments: No oropharyngeal swelling.  Tongue normal size.  Controlling secretions and airway.  No stridor Eyes:     Conjunctiva/sclera: Conjunctivae normal.  Cardiovascular:     Rate and Rhythm: Normal rate and regular rhythm.     Heart sounds: No murmur heard.   Pulmonary:     Effort: Pulmonary effort is normal. No respiratory distress.     Breath sounds: Normal breath sounds. No wheezing or rales.  Abdominal:     Palpations: Abdomen is soft.     Tenderness: There is no abdominal tenderness.  Musculoskeletal:     Cervical back: Neck supple.  Skin:    General: Skin is warm and dry.     Comments: Diffuse urticarial rash with coalescence on the torso and upper arms.  Minimal facial involvement.  Neurological:     Mental Status: She is alert.      UC Treatments / Results  Labs (all labs ordered are listed, but only  abnormal results are  displayed) Labs Reviewed - No data to display  EKG   Radiology No results found.  Procedures Procedures (including critical care time)  Medications Ordered in UC Medications - No data to display  Initial Impression / Assessment and Plan / UC Course  I have reviewed the triage vital signs and the nursing notes.  Pertinent labs & imaging results that were available during my care of the patient were reviewed by me and considered in my medical decision making (see chart for details).     Urticaria Patient is a 39 year old otherwise healthy female presenting with an urticarial reaction to an unknown trigger.  No respiratory involvement.  Normal vital signs.  Given diffuse nature will place on prednisone and antihistamines.  Recommend Zyrtec during the day and Benadryl at night.  Also recommend her take Pepcid until symptoms resolve.  Discussed strict emergency department precautions.  Return and fall precautions discussed.  Patient verbalized understanding. Final Clinical Impressions(s) / UC Diagnoses   Final diagnoses:  Urticaria     Discharge Instructions     Take prednisone as prescribed Take benadryl, 50mg  tonight, do not drive after taking this will make you sleepy Take pepcid 2 times a day Take zyrtec daily  If shortness of breath, throat swelling go to the ED   If not improving over 48 hours return      ED Prescriptions    Medication Sig Dispense Auth. Provider   predniSONE (DELTASONE) 10 MG tablet Take 4 tablets (40 mg total) by mouth daily for 3 days. 12 tablet Alixander Rallis, Marguerita Beards, PA-C   cetirizine (ZYRTEC ALLERGY) 10 MG tablet Take 1 tablet (10 mg total) by mouth daily. 30 tablet Sherria Riemann, Marguerita Beards, PA-C   famotidine (PEPCID) 20 MG tablet Take 1 tablet (20 mg total) by mouth 2 (two) times daily. 30 tablet Cyree Chuong, Marguerita Beards, PA-C     PDMP not reviewed this encounter.   Purnell Shoemaker, PA-C 11/11/19 2358

## 2019-11-11 NOTE — Discharge Instructions (Signed)
Take prednisone as prescribed Take benadryl, 50mg  tonight, do not drive after taking this will make you sleepy Take pepcid 2 times a day Take zyrtec daily  If shortness of breath, throat swelling go to the ED   If not improving over 48 hours return

## 2020-01-03 ENCOUNTER — Other Ambulatory Visit: Payer: Self-pay | Admitting: Endocrinology

## 2020-01-03 DIAGNOSIS — E041 Nontoxic single thyroid nodule: Secondary | ICD-10-CM

## 2020-01-10 ENCOUNTER — Ambulatory Visit
Admission: RE | Admit: 2020-01-10 | Discharge: 2020-01-10 | Disposition: A | Discharge status: Home / Self Care | Payer: BC Managed Care – PPO | Source: Ambulatory Visit | Attending: Endocrinology | Admitting: Endocrinology

## 2020-01-10 DIAGNOSIS — E041 Nontoxic single thyroid nodule: Secondary | ICD-10-CM

## 2020-04-30 ENCOUNTER — Ambulatory Visit: Payer: BC Managed Care – PPO | Admitting: Obstetrics

## 2020-05-07 ENCOUNTER — Ambulatory Visit (INDEPENDENT_AMBULATORY_CARE_PROVIDER_SITE_OTHER): Payer: BC Managed Care – PPO | Admitting: Obstetrics

## 2020-05-07 ENCOUNTER — Ambulatory Visit
Admission: RE | Admit: 2020-05-07 | Discharge: 2020-05-07 | Disposition: A | Discharge status: Home / Self Care | Payer: BC Managed Care – PPO | Source: Ambulatory Visit | Attending: Obstetrics | Admitting: Obstetrics

## 2020-05-07 ENCOUNTER — Other Ambulatory Visit: Payer: Self-pay

## 2020-05-07 ENCOUNTER — Encounter: Payer: Self-pay | Admitting: Obstetrics

## 2020-05-07 ENCOUNTER — Other Ambulatory Visit (HOSPITAL_COMMUNITY)
Admission: RE | Admit: 2020-05-07 | Discharge: 2020-05-07 | Disposition: A | Discharge status: Home / Self Care | Payer: BC Managed Care – PPO | Source: Ambulatory Visit | Attending: Obstetrics | Admitting: Obstetrics

## 2020-05-07 VITALS — BP 135/89 | HR 94 | Wt 279.9 lb

## 2020-05-07 DIAGNOSIS — D251 Intramural leiomyoma of uterus: Secondary | ICD-10-CM | POA: Diagnosis not present

## 2020-05-07 DIAGNOSIS — Z01411 Encounter for gynecological examination (general) (routine) with abnormal findings: Secondary | ICD-10-CM

## 2020-05-07 DIAGNOSIS — Z3041 Encounter for surveillance of contraceptive pills: Secondary | ICD-10-CM

## 2020-05-07 DIAGNOSIS — Z01419 Encounter for gynecological examination (general) (routine) without abnormal findings: Secondary | ICD-10-CM | POA: Insufficient documentation

## 2020-05-07 DIAGNOSIS — N898 Other specified noninflammatory disorders of vagina: Secondary | ICD-10-CM

## 2020-05-07 DIAGNOSIS — Z6841 Body Mass Index (BMI) 40.0 and over, adult: Secondary | ICD-10-CM

## 2020-05-07 DIAGNOSIS — N939 Abnormal uterine and vaginal bleeding, unspecified: Secondary | ICD-10-CM

## 2020-05-07 DIAGNOSIS — Z113 Encounter for screening for infections with a predominantly sexual mode of transmission: Secondary | ICD-10-CM

## 2020-05-07 MED ORDER — DROSPIRENONE-ETHINYL ESTRADIOL 3-0.02 MG PO TABS: 1.0000 | ORAL_TABLET | Freq: Every day | ORAL | 11 refills | Status: DC

## 2020-05-07 NOTE — Addendum Note (Signed)
Addended by: Baltazar Najjar A on: 05/07/2020 12:37 PM   Modules accepted: Orders

## 2020-05-07 NOTE — Progress Notes (Signed)
Last pap- normal 04/19/2019

## 2020-05-07 NOTE — Progress Notes (Addendum)
Subjective:        Felicia Pham is a 40 y.o. female here for a routine exam.  Current complaints: Heavy 5-6 day periods.    Personal health questionnaire:  Is patient Felicia Pham, have a family history of breast and/or ovarian cancer: no Is there a family history of uterine cancer diagnosed at age < 85, gastrointestinal cancer, urinary tract cancer, family member who is a Field seismologist syndrome-associated carrier: no Is the patient overweight and hypertensive, family history of diabetes, personal history of gestational diabetes, preeclampsia or PCOS: yes Is patient over 2, have PCOS,  family history of premature CHD under age 31, diabetes, smoke, have hypertension or peripheral artery disease:  no At any time, has a partner hit, kicked or otherwise hurt or frightened you?: no Over the past 2 weeks, have you felt down, depressed or hopeless?: no Over the past 2 weeks, have you felt little interest or pleasure in doing things?:no   Gynecologic History No LMP recorded. Contraception: none Last Pap: 04-19-2019. Results were: normal Last mammogram: n/a. Results were: n/a  Obstetric History OB History  Gravida Para Term Preterm AB Living  0 0 0 0 0 0  SAB IAB Ectopic Multiple Live Births  0 0 0 0      Past Medical History:  Diagnosis Date  . Rib fracture   . Uterine fibroid     Past Surgical History:  Procedure Laterality Date  . MYOMECTOMY  2014     Current Outpatient Medications:  .  BACLOFEN PO, Take by mouth., Disp: , Rfl:  .  cetirizine (ZYRTEC ALLERGY) 10 MG tablet, Take 1 tablet (10 mg total) by mouth daily., Disp: 30 tablet, Rfl: 0 .  cyclobenzaprine (FLEXERIL) 10 MG tablet, Take 1 tablet (10 mg total) by mouth 2 (two) times daily as needed for muscle spasms., Disp: 20 tablet, Rfl: 0 .  drospirenone-ethinyl estradiol (YAZ) 3-0.02 MG tablet, Take 1 tablet by mouth daily., Disp: 28 tablet, Rfl: 11 .  famotidine (PEPCID) 20 MG tablet, Take 1 tablet (20 mg total) by  mouth 2 (two) times daily. (Patient not taking: Reported on 05/07/2020), Disp: 30 tablet, Rfl: 0 .  hydrocortisone 2.5 % cream, Apply 1 application topically daily.  (Patient not taking: Reported on 05/07/2020), Disp: , Rfl: 2 .  ibuprofen (ADVIL,MOTRIN) 200 MG tablet, Take 600-800 mg by mouth every 6 (six) hours as needed for moderate pain. Reported on 09/25/2015, Disp: , Rfl:  .  lidocaine (LIDODERM) 5 %, Place 1 patch onto the skin daily. Remove & Discard patch within 12 hours or as directed by MD (Patient not taking: Reported on 05/07/2020), Disp: 30 patch, Rfl: 0 No Known Allergies  Social History   Tobacco Use  . Smoking status: Never Smoker  . Smokeless tobacco: Never Used  Substance Use Topics  . Alcohol use: No    Family History  Problem Relation Age of Onset  . Lupus Mother   . Hypertension Mother   . Hypertension Father   . Diabetes Father   . Heart attack Father 60  . Diabetes Maternal Grandmother   . Diabetes Paternal Grandmother       Review of Systems  Constitutional: negative for fatigue and weight loss Respiratory: negative for cough and wheezing Cardiovascular: negative for chest pain, fatigue and palpitations Gastrointestinal: negative for abdominal pain and change in bowel habits Musculoskeletal:negative for myalgias Neurological: negative for gait problems and tremors Behavioral/Psych: negative for abusive relationship, depression Endocrine: negative for temperature intolerance  Genitourinary: positive for heavy menstrual periods.  Negative for genital lesions, hot flashes, sexual problems and vaginal discharge Integument/breast: negative for breast lump, breast tenderness, nipple discharge and skin lesion(s)    Objective:       BP 135/89   Pulse 94   Wt 279 lb 14.4 oz (127 kg)   BMI 48.04 kg/m  General:   alert and no distress  Skin:   no rash or abnormalities  Lungs:   clear to auscultation bilaterally  Heart:   regular rate and rhythm, S1, S2  normal, no murmur, click, rub or gallop  Breasts:   normal without suspicious masses, skin or nipple changes or axillary nodes  Abdomen:  normal findings: no organomegaly, soft, non-tender and no hernia  Pelvis:  External genitalia: normal general appearance Urinary system: urethral meatus normal and bladder without fullness, nontender Vaginal: normal without tenderness, induration or masses Cervix: normal appearance Adnexa: normal bimanual exam Uterus: retroverted and non-tender, enlarged   Lab Review Urine pregnancy test Labs reviewed yes Radiologic studies reviewed yes  50% of 20 min visit spent on counseling and coordination of care.    Assessment:     1. Encounter for gynecological examination with Papanicolaou smear of cervix Rx: - Cytology - PAP( [name])  2. Vaginal discharge Rx: - Cervicovaginal ancillary only( [name])  3. Screening for STD (sexually transmitted disease) Rx: - RPR - HIV Antibody (routine testing w rflx) - Hepatitis C Antibody - Hepatitis B surface antigen  4. Intramural leiomyoma of uterus.  S/P myomectomy in 2014 at [name] Rx: - US PELVIC COMPLETE WITH TRANSVAGINAL; Future  5. Abnormal uterine bleeding (AUB) - L.  Has history of fibroids.  Myomectomy done in 2014 at [name], and periods have been heavy since, with 5-6 day duration  6. Encounter for surveillance of contraceptive pills - stopped taking pills - does not want to restart pills at this time  7. Class 3 severe obesity due to excess calories without serious comorbidity with body mass index (BMI) of 45.0 to 49.9 in adult [name]) - program of caloric reduction, exercise and behavioral modification recommended    Plan:    Education reviewed: calcium supplements, depression evaluation, low fat, low cholesterol diet, safe sex/STD prevention, self breast exams and weight bearing exercise. Contraception: OCP (estrogen/progesterone). Follow up in: 1 year.   Meds ordered this  encounter  Medications  . drospirenone-ethinyl estradiol (YAZ) 3-0.02 MG tablet    Sig: Take 1 tablet by mouth daily.    Dispense:  28 tablet    Refill:  11   Orders Placed This Encounter  Procedures  . US PELVIC COMPLETE WITH TRANSVAGINAL    Epic  Ins - [employer]BS Wt - 279 No needs  No glucose monitor, spinal stimulator, spinal injector No Covid Prep - full bladder; 32oz of H2O; do not void 1 hr before appt Alc     Standing Status:   Future    Standing Expiration Date:   05/07/2021    Order Specific Question:   Reason for Exam (SYMPTOM  OR DIAGNOSIS REQUIRED)    Answer:   Fibroids.  AUB.    Order Specific Question:   Preferred imaging location?    Answer:   [facility]  . RPR  . HIV Antibody (routine testing w rflx)  . Hepatitis C Antibody  . Hepatitis B surface antigen    Shelly Bombard, MD 05/07/2020 12:06 PM

## 2020-05-08 LAB — CERVICOVAGINAL ANCILLARY ONLY
Bacterial Vaginitis (gardnerella): POSITIVE — AB
Candida Glabrata: NEGATIVE
Candida Vaginitis: NEGATIVE
Chlamydia: NEGATIVE
Comment: NEGATIVE
Comment: NEGATIVE
Comment: NEGATIVE
Comment: NEGATIVE
Comment: NEGATIVE
Comment: NORMAL
Neisseria Gonorrhea: NEGATIVE
Trichomonas: NEGATIVE

## 2020-05-08 LAB — HIV ANTIBODY (ROUTINE TESTING W REFLEX): HIV Screen 4th Generation wRfx: NONREACTIVE

## 2020-05-08 LAB — RPR: RPR Ser Ql: NONREACTIVE

## 2020-05-08 LAB — HEPATITIS B SURFACE ANTIGEN: Hepatitis B Surface Ag: NEGATIVE

## 2020-05-08 LAB — HEPATITIS C ANTIBODY: Hep C Virus Ab: <0.1 s/co ratio (ref 0.0–0.9)

## 2020-05-09 ENCOUNTER — Other Ambulatory Visit: Payer: Self-pay | Admitting: Obstetrics

## 2020-05-09 DIAGNOSIS — N760 Acute vaginitis: Secondary | ICD-10-CM

## 2020-05-09 DIAGNOSIS — B9689 Other specified bacterial agents as the cause of diseases classified elsewhere: Secondary | ICD-10-CM

## 2020-05-09 MED ORDER — METRONIDAZOLE 500 MG PO TABS: 500.0000 mg | ORAL_TABLET | Freq: Two times a day (BID) | ORAL | 2 refills | Status: DC

## 2020-05-10 LAB — CYTOLOGY - PAP
Adequacy: ABSENT
Comment: NEGATIVE
Diagnosis: UNDETERMINED — AB
High risk HPV: NEGATIVE

## 2020-06-01 ENCOUNTER — Encounter: Payer: Self-pay | Admitting: Obstetrics

## 2020-06-01 ENCOUNTER — Telehealth (INDEPENDENT_AMBULATORY_CARE_PROVIDER_SITE_OTHER): Payer: BC Managed Care – PPO | Admitting: Obstetrics

## 2020-06-01 DIAGNOSIS — N939 Abnormal uterine and vaginal bleeding, unspecified: Secondary | ICD-10-CM

## 2020-06-01 DIAGNOSIS — R9389 Abnormal findings on diagnostic imaging of other specified body structures: Secondary | ICD-10-CM

## 2020-06-01 NOTE — Progress Notes (Signed)
GYNECOLOGY VIRTUAL VISIT ENCOUNTER NOTE  Provider location: [facility] [place]   I connected with Felicia Pham on 06/01/20 at 11:00 AM EST by MyChart Video Encounter at home and verified that I am speaking with the correct person using two identifiers.   I discussed the limitations, risks, security and privacy concerns of performing an evaluation and management service virtually and the availability of in person appointments. I also discussed with the patient that there may be a patient responsible charge related to this service. The patient expressed understanding and agreed to proceed.   History:  Felicia Pham is a 40 y.o. G0P0000 female being evaluated today for AUB and discussion of ultrasound results.  She denies any abnormal vaginal discharge, bleeding, pelvic pain or other concerns.       Past Medical History:  Diagnosis Date  . Rib fracture   . Uterine fibroid    Past Surgical History:  Procedure Laterality Date  . MYOMECTOMY  2014   The following portions of the patient's history were reviewed and updated as appropriate: allergies, current medications, past family history, past medical history, past social history, past surgical history and problem list.   Health Maintenance:  ASCUS pap and negative HRHPV on 05-07-2020.    Review of Systems:  Pertinent items noted in HPI and remainder of comprehensive ROS otherwise negative.  Physical Exam:   General:  Alert, oriented and cooperative. Patient appears to be in no acute distress.  Mental Status: Normal mood and affect. Normal behavior. Normal judgment and thought content.   Respiratory: Normal respiratory effort, no problems with respiration noted  Rest of physical exam deferred due to type of encounter  Labs and Imaging No results found for this or any previous visit (from the past 336 hour(s)). US PELVIC COMPLETE WITH TRANSVAGINAL  Result Date: 05/07/2020 CLINICAL DATA:  Uterine  fibroids, abnormal uterine bleeding, LMP 04/25/2020 EXAM: TRANSABDOMINAL AND TRANSVAGINAL ULTRASOUND OF PELVIS TECHNIQUE: Both transabdominal and transvaginal ultrasound examinations of the pelvis were performed. Transabdominal technique was performed for global imaging of the pelvis including uterus, ovaries, adnexal regions, and pelvic cul-de-sac. It was necessary to proceed with endovaginal exam following the transabdominal exam to visualize the endometrium and ovaries. COMPARISON:  11/27/2014 FINDINGS: Uterus Measurements: 14.6 x 8.4 x 10.8 cm = volume: 691 mL. Retroflexed. Heterogeneous myometrium. Multiple uterine masses are seen consistent with leiomyomata. Largest of these measure 6.0 cm at anterior LEFT fundus, 5.8 cm posterior upper uterus on RIGHT, and 4.1 cm at posterior uterus. Several of the leiomyomata at extends submucosal. Endometrium Thickness: 15 mm. Poorly visualized due to multiple uterine leiomyomata. Heterogeneous without gross mass or fluid Right ovary Not visualized, likely obscured by bowel Left ovary Not visualized, likely obscured by bowel Other findings No free pelvic fluid. No adnexal masses. Incidentally noted small amount of nonspecific endocervical fluid. IMPRESSION: Nonvisualization of ovaries. Enlarged uterus containing multiple leiomyomata, some which extend submucosal. Limited visualization of endometrial complex. Electronically Signed   By: Lavonia Dana M.D.   On: 05/07/2020 16:28       Assessment and Plan:     1. Abnormal uterine bleeding (AUB) - enlarged uterus with multiple intramural fibroids and a 15 mm endometrial stripe  2. Endometrial thickening on ultrasound - endometrial biopsy scheduled       I discussed the assessment and treatment plan with the patient. The patient was provided an opportunity to ask questions and all were answered. The patient agreed with the plan  and demonstrated an understanding of the instructions.   The patient was advised to call  back or seek an in-person evaluation/go to the ED if the symptoms worsen or if the condition fails to improve as anticipated.  I provided 15 minutes of face-to-face time during this encounter.   Baltazar Najjar, MD [facility] 06/01/20

## 2020-06-08 ENCOUNTER — Other Ambulatory Visit: Payer: Self-pay

## 2020-06-08 ENCOUNTER — Ambulatory Visit (INDEPENDENT_AMBULATORY_CARE_PROVIDER_SITE_OTHER): Payer: BC Managed Care – PPO | Admitting: Obstetrics

## 2020-06-08 ENCOUNTER — Encounter: Payer: Self-pay | Admitting: Obstetrics

## 2020-06-08 VITALS — BP 117/80 | HR 84 | Ht 64.0 in | Wt 278.0 lb

## 2020-06-08 DIAGNOSIS — D219 Benign neoplasm of connective and other soft tissue, unspecified: Secondary | ICD-10-CM

## 2020-06-08 DIAGNOSIS — N882 Stricture and stenosis of cervix uteri: Secondary | ICD-10-CM | POA: Diagnosis not present

## 2020-06-08 DIAGNOSIS — R9389 Abnormal findings on diagnostic imaging of other specified body structures: Secondary | ICD-10-CM | POA: Diagnosis not present

## 2020-06-08 DIAGNOSIS — N939 Abnormal uterine and vaginal bleeding, unspecified: Secondary | ICD-10-CM | POA: Diagnosis not present

## 2020-06-08 DIAGNOSIS — Z6841 Body Mass Index (BMI) 40.0 and over, adult: Secondary | ICD-10-CM

## 2020-06-08 NOTE — Progress Notes (Signed)
GYN presents for Endo Bx.  UPT today is NEGATIVE

## 2020-06-08 NOTE — Patient Instructions (Signed)
Abnormal Uterine Bleeding  Abnormal uterine bleeding is unusual bleeding from the uterus. It includes bleeding after sex, or bleeding or spotting between menstrual periods. It may also include bleeding that is heavier than normal, menstrual periods that last longer than usual, or bleeding that occurs after menopause. Abnormal uterine bleeding can affect teenagers, women in their reproductive years, pregnant women, and women who have reached menopause. Common causes of abnormal uterine bleeding include:  Pregnancy.  Growths of tissue (polyps).  Benign tumors or growths in the uterus (fibroids). These are not cancer.  Infection.  Cancer.  Too much or too little of some hormones in the body (hormonal imbalances). Any type of abnormal bleeding should be checked by a health care provider. Many cases are minor and simple to treat, but others may be more serious. Treatment will depend on the cause and severity of the bleeding. Follow these instructions at home: Medicines  Take over-the-counter and prescription medicines only as told by your health care provider.  Tell your health care provider about other medicines that you take. You may be asked to stop taking aspirin or medicines that contain aspirin. These medicines can make bleeding worse.  If you were prescribed iron pills, take them as told by your health care provider. Iron pills help to replace iron that your body loses because of this condition. Managing constipation In cases of severe bleeding, you may be asked to increase your iron intake to treat anemia. This may cause constipation. To prevent or treat constipation, you may need to:  Drink enough fluid to keep your urine pale yellow.  Take over-the-counter or prescription medicines.  Eat foods that are high in fiber, such as beans, whole grains, and fresh fruits and vegetables.  Limit foods that are high in fat and processed sugars, such as fried or sweet foods. General  instructions  Monitor your condition for any changes.  Do not use tampons, douche, or have sex until your health care provider says these things are okay.  Change your pads often.  Get regular exams. This includes pelvic exams and cervical cancer screenings. ? It is up to you to get the results of any tests that are done. Ask your health care provider, or the department that is doing the tests, when your results will be ready.  Keep all follow-up visits as told by your health care provider. This is important. Contact a health care provider if you:  Have bleeding that lasts for more than 1 week.  Feel dizzy at times.  Feel nauseous or you vomit.  Feel light-headed or weak.  Notice any other changes that show that your condition is getting worse. Get help right away if you:  Pass out.  Have bleeding that soaks through a pad every hour.  Have pain in the abdomen.  Have a fever or chills.  Become sweaty or weak.  Pass large blood clots from your vagina. Summary  Abnormal uterine bleeding is unusual bleeding from the uterus.  Any type of abnormal bleeding should be evaluated by a health care provider. Many cases are minor and simple to treat, but others may be more serious.  Treatment will depend on the cause of the bleeding.  Get help right away if you pass out, you have bleeding that soaks through a pad every hour, or you pass large blood clots from your vagina. This information is not intended to replace advice given to you by your health care provider. Make sure you discuss any questions you  have with your health care provider. Document Revised: 12/07/2019 Document Reviewed: 02/01/2019 Elsevier Patient Education  [place].

## 2020-06-08 NOTE — Progress Notes (Signed)
Patient ID: Felicia Pham, female   DOB: 09/21/1980, 40 y.o.   MRN: [id number]  Chief Complaint  Patient presents with  . ENDOMETRIAL BIOPSY    HPI Felicia Pham is a 40 y.o. female.  Has heavy 4-5 day periods.  No intermenstrual bleeding.  Has history of fibroids, and had a myomectomy procedure done at [facility] in 2014.  She is a nulligravida and would like to have a child in the near future. HPI  Past Medical History:  Diagnosis Date  . Rib fracture   . Uterine fibroid     Past Surgical History:  Procedure Laterality Date  . MYOMECTOMY  2014    Family History  Problem Relation Age of Onset  . Lupus Mother   . Hypertension Mother   . Hypertension Father   . Diabetes Father   . Heart attack Father 71  . Diabetes Maternal Grandmother   . Diabetes Paternal Grandmother     Social History Social History   Tobacco Use  . Smoking status: Never Smoker  . Smokeless tobacco: Never Used  Vaping Use  . Vaping Use: Never used  Substance Use Topics  . Alcohol use: No  . Drug use: No    No Known Allergies  Current Outpatient Medications  Medication Sig Dispense Refill  . BACLOFEN PO Take by mouth.    . cetirizine (ZYRTEC ALLERGY) 10 MG tablet Take 1 tablet (10 mg total) by mouth daily. 30 tablet 0  . cyclobenzaprine (FLEXERIL) 10 MG tablet Take 1 tablet (10 mg total) by mouth 2 (two) times daily as needed for muscle spasms. 20 tablet 0  . drospirenone-ethinyl estradiol (YAZ) 3-0.02 MG tablet Take 1 tablet by mouth daily.    . famotidine (PEPCID) 20 MG tablet Take 1 tablet (20 mg total) by mouth 2 (two) times daily. (Patient not taking: Reported on 05/07/2020) 30 tablet 0  . hydrocortisone 2.5 % cream Apply 1 application topically daily.  (Patient not taking: Reported on 05/07/2020)  2  . ibuprofen (ADVIL,MOTRIN) 200 MG tablet Take 600-800 mg by mouth every 6 (six) hours as needed for moderate pain. Reported on 09/25/2015    . lidocaine (LIDODERM) 5 % Place 1 patch onto the  skin daily. Remove & Discard patch within 12 hours or as directed by MD (Patient not taking: Reported on 05/07/2020) 30 patch 0  . meloxicam (MOBIC) 7.5 MG tablet     . metroNIDAZOLE (FLAGYL) 500 MG tablet Take 1 tablet (500 mg total) by mouth 2 (two) times daily. 14 tablet 2   No current facility-administered medications for this visit.    Review of Systems Review of Systems Constitutional: negative for fatigue and weight loss Respiratory: negative for cough and wheezing Cardiovascular: negative for chest pain, fatigue and palpitations Gastrointestinal: negative for abdominal pain and change in bowel habits Genitourinary: positive for heavy 4-5 day periods Integument/breast: negative for nipple discharge Musculoskeletal:negative for myalgias Neurological: negative for gait problems and tremors Behavioral/Psych: negative for abusive relationship, depression Endocrine: negative for temperature intolerance      Blood pressure 117/80, pulse 84, height 5\' 4"  (1.626 m), weight 278 lb (126.1 kg), last menstrual period 05/24/2020.  Physical Exam Physical Exam           General: Alert and no distress Abdomen:  normal findings: no organomegaly, soft, non-tender and no hernia  Pelvis:  External genitalia: normal general appearance Urinary system: urethral meatus normal and bladder without fullness, nontender Vaginal: normal without tenderness, induration or masses Cervix: os  stenosed.  Attempted probing with a dilator and was able to get the tip in but could not dilate enough to pass a Pipelle catheter for endometrial sampling Adnexa: normal bimanual exam Uterus: anteverted and non-tender, normal size    50% of 20 min visit spent on counseling and coordination of care.   Data Reviewed Pap Smears Ultrasound   US PELVIC COMPLETE WITH TRANSVAGINAL (Accession [id number]) (Order [id number]) Imaging Date: 05/07/2020 Department: [facility] Released By: Sharlet Salina Authorizing: Shelly Bombard, MD    Exam Status  Status  Final [99]   PACS Intelerad Image Link  Show images for US PELVIC COMPLETE WITH TRANSVAGINAL  Study Result  Narrative & Impression  CLINICAL DATA:  Uterine fibroids, abnormal uterine bleeding, LMP 04/25/2020  EXAM: TRANSABDOMINAL AND TRANSVAGINAL ULTRASOUND OF PELVIS  TECHNIQUE: Both transabdominal and transvaginal ultrasound examinations of the pelvis were performed. Transabdominal technique was performed for global imaging of the pelvis including uterus, ovaries, adnexal regions, and pelvic cul-de-sac. It was necessary to proceed with endovaginal exam following the transabdominal exam to visualize the endometrium and ovaries.  COMPARISON:  11/27/2014  FINDINGS: Uterus  Measurements: 14.6 x 8.4 x 10.8 cm = volume: 691 mL. Retroflexed. Heterogeneous myometrium. Multiple uterine masses are seen consistent with leiomyomata. Largest of these measure 6.0 cm at anterior LEFT fundus, 5.8 cm posterior upper uterus on RIGHT, and 4.1 cm at posterior uterus. Several of the leiomyomata at extends submucosal.  Endometrium  Thickness: 15 mm. Poorly visualized due to multiple uterine leiomyomata. Heterogeneous without gross mass or fluid  Right ovary  Not visualized, likely obscured by bowel  Left ovary  Not visualized, likely obscured by bowel  Other findings  No free pelvic fluid. No adnexal masses. Incidentally noted small amount of nonspecific endocervical fluid.  IMPRESSION: Nonvisualization of ovaries.  Enlarged uterus containing multiple leiomyomata, some which extend submucosal.  Limited visualization of endometrial complex.   Electronically Signed   By: Lavonia Dana M.D.   On: 05/07/2020 16:28        Assessment     1. Abnormal uterine bleeding (AUB) Rx: - POCT urine pregnancy  2. Fibroids - s/p myomectomies   3. Endometrial thickening on ultrasound -  attempted endometrial biopsy without success - started on OCP's for 3 months, then will return tor endometrial biopsy with Cytotec pre-dosing  4. Cervical stenosis (uterine cervix)  5. Class 3 severe obesity due to excess calories without serious comorbidity with body mass index (BMI) of 45.0 to 49.9 in adult [facility]) - program of caloric reduction, exercise and behavioral modification recommended    Plan   Follow up in 3 months  Orders Placed This Encounter  Procedures  . POCT urine pregnancy      Shelly Bombard, MD 06/08/2020 11:45 AM

## 2020-08-06 ENCOUNTER — Ambulatory Visit (INDEPENDENT_AMBULATORY_CARE_PROVIDER_SITE_OTHER): Payer: BC Managed Care – PPO | Admitting: Obstetrics

## 2020-08-06 ENCOUNTER — Other Ambulatory Visit: Payer: Self-pay

## 2020-08-06 ENCOUNTER — Encounter: Payer: Self-pay | Admitting: Obstetrics

## 2020-08-06 VITALS — BP 122/83 | HR 80 | Wt 276.0 lb

## 2020-08-06 DIAGNOSIS — R9389 Abnormal findings on diagnostic imaging of other specified body structures: Secondary | ICD-10-CM

## 2020-08-06 DIAGNOSIS — N882 Stricture and stenosis of cervix uteri: Secondary | ICD-10-CM | POA: Diagnosis not present

## 2020-08-06 DIAGNOSIS — D251 Intramural leiomyoma of uterus: Secondary | ICD-10-CM

## 2020-08-06 DIAGNOSIS — Z6841 Body Mass Index (BMI) 40.0 and over, adult: Secondary | ICD-10-CM

## 2020-08-06 DIAGNOSIS — D250 Submucous leiomyoma of uterus: Secondary | ICD-10-CM

## 2020-08-06 DIAGNOSIS — Z3169 Encounter for other general counseling and advice on procreation: Secondary | ICD-10-CM

## 2020-08-06 NOTE — Progress Notes (Signed)
Pt is in for follow up.  Could not previously get sample for Endo Bx. Pt was started on pills to help with bleeding. Pt states some lower pelvic pain today and spotting.

## 2020-08-06 NOTE — Progress Notes (Signed)
Patient ID: Felicia Pham, female   DOB: 09/07/1980, 40 y.o.   MRN: [id number]  Chief Complaint  Patient presents with  . Follow-up    HPI AVILENE Pham is a 40 y.o. female.  Presents for discussion of ultrasound and management recommendations for recurrent uterine fibroids with a desire for future pregnancy. She is a nulligravida.  Has a PSH of myomectomies done at [facility] in 2014. HPI  Past Medical History:  Diagnosis Date  . Rib fracture   . Uterine fibroid     Past Surgical History:  Procedure Laterality Date  . MYOMECTOMY  2014    Family History  Problem Relation Age of Onset  . Lupus Mother   . Hypertension Mother   . Hypertension Father   . Diabetes Father   . Heart attack Father 50  . Diabetes Maternal Grandmother   . Diabetes Paternal Grandmother     Social History Social History   Tobacco Use  . Smoking status: Never Smoker  . Smokeless tobacco: Never Used  Vaping Use  . Vaping Use: Never used  Substance Use Topics  . Alcohol use: No  . Drug use: No    No Known Allergies  Current Outpatient Medications  Medication Sig Dispense Refill  . drospirenone-ethinyl estradiol (YAZ) 3-0.02 MG tablet Take 1 tablet by mouth daily.    Marland Kitchen BACLOFEN PO Take by mouth.    . cetirizine (ZYRTEC ALLERGY) 10 MG tablet Take 1 tablet (10 mg total) by mouth daily. 30 tablet 0  . cyclobenzaprine (FLEXERIL) 10 MG tablet Take 1 tablet (10 mg total) by mouth 2 (two) times daily as needed for muscle spasms. 20 tablet 0  . famotidine (PEPCID) 20 MG tablet Take 1 tablet (20 mg total) by mouth 2 (two) times daily. (Patient not taking: Reported on 05/07/2020) 30 tablet 0  . hydrocortisone 2.5 % cream Apply 1 application topically daily.  (Patient not taking: Reported on 05/07/2020)  2  . ibuprofen (ADVIL,MOTRIN) 200 MG tablet Take 600-800 mg by mouth every 6 (six) hours as needed for moderate pain. Reported on 09/25/2015    . lidocaine (LIDODERM) 5 % Place 1 patch  onto the skin daily. Remove & Discard patch within 12 hours or as directed by MD (Patient not taking: Reported on 05/07/2020) 30 patch 0  . meloxicam (MOBIC) 7.5 MG tablet     . metroNIDAZOLE (FLAGYL) 500 MG tablet Take 1 tablet (500 mg total) by mouth 2 (two) times daily. 14 tablet 2   No current facility-administered medications for this visit.    Review of Systems Review of Systems Constitutional: negative for fatigue and weight loss Respiratory: negative for cough and wheezing Cardiovascular: negative for chest pain, fatigue and palpitations Gastrointestinal: negative for abdominal pain and change in bowel habits Genitourinary:positive for recurrent fibroids and heavy painful periods Integument/breast: negative for nipple discharge Musculoskeletal:negative for myalgias Neurological: negative for gait problems and tremors Behavioral/Psych: negative for abusive relationship, depression Endocrine: negative for temperature intolerance      Blood pressure 122/83, pulse 80, weight 276 lb (125.2 kg), last menstrual period 07/15/2020.  Physical Exam Physical Exam General:   alert and no distress  Skin:   no rash or abnormalities  Lungs:   clear to auscultation bilaterally  Heart:   regular rate and rhythm, S1, S2 normal, no murmur, click, rub or gallop  The remainder physical exam was deferred.   Data Reviewed:  Ultrasound   US PELVIC COMPLETE WITH TRANSVAGINAL (Accession [id number]) (Order  [id number]) Imaging Date: 05/07/2020 Department: [facility] AT [facility] Released By: Sharlet Salina Authorizing: Shelly Bombard, MD    Exam Status  Status  Final [99]   PACS Intelerad Image Link  Show images for US PELVIC COMPLETE WITH TRANSVAGINAL  Study Result  Narrative & Impression  CLINICAL DATA:  Uterine fibroids, abnormal uterine bleeding, LMP 04/25/2020  EXAM: TRANSABDOMINAL AND TRANSVAGINAL ULTRASOUND OF PELVIS  TECHNIQUE: Both  transabdominal and transvaginal ultrasound examinations of the pelvis were performed. Transabdominal technique was performed for global imaging of the pelvis including uterus, ovaries, adnexal regions, and pelvic cul-de-sac. It was necessary to proceed with endovaginal exam following the transabdominal exam to visualize the endometrium and ovaries.  COMPARISON:  11/27/2014  FINDINGS: Uterus  Measurements: 14.6 x 8.4 x 10.8 cm = volume: 691 mL. Retroflexed. Heterogeneous myometrium. Multiple uterine masses are seen consistent with leiomyomata. Largest of these measure 6.0 cm at anterior LEFT fundus, 5.8 cm posterior upper uterus on RIGHT, and 4.1 cm at posterior uterus. Several of the leiomyomata at extends submucosal.  Endometrium  Thickness: 15 mm. Poorly visualized due to multiple uterine leiomyomata. Heterogeneous without gross mass or fluid  Right ovary  Not visualized, likely obscured by bowel  Left ovary  Not visualized, likely obscured by bowel  Other findings  No free pelvic fluid. No adnexal masses. Incidentally noted small amount of nonspecific endocervical fluid.  IMPRESSION: Nonvisualization of ovaries.  Enlarged uterus containing multiple leiomyomata, some which extend submucosal.  Limited visualization of endometrial complex.   Electronically Signed   By: Felicia Pham M.D.   On: 05/07/2020 16:28      Assessment and Plan:    1. Intramural and submucous leiomyoma of uterus Rx: - POCT urine pregnancy: Negative - Ambulatory referral to Reproductive Endocrinology for preconception consultation and further evaluation of fibroids as this may relate to conception  2. Endometrial thickening on ultrasound - needs endometrial biopsy  3. Cervical stenosis (uterine cervix) - will need Cytotec cervical ripening prior to endometrial biopsy  4. Class 3 severe obesity due to excess calories without serious comorbidity with body mass index  (BMI) of 45.0 to 49.9 in adult [facility]) - program of caloric reduction, exercise and behavioral modification recommended, which will enhance chances of conception with improved ovulation  5. Encounter for preconception consultation - conception may be difficult with intramural fibroids extending into the submucous areas of the uterine cavity - AMA discussed - will defer to Dr. Kerin Perna for further work-up and consultation - obesity with probable anovulatory cycles discussed with recommendation for weight loss   Plan   Referred to Dr. Eda Keys, Reproductive Endocrinologist  Orders Placed This Encounter  Procedures  . Ambulatory referral to Endocrinology    Referral Priority:   Routine    Referral Type:   Consultation    Referral Reason:   Specialty Services Required    Number of Visits Requested:   1  . POCT urine pregnancy    I have spent a total of 10 minutes of face-to-face time, excluding clinical staff time, reviewing notes and preparing to see patient, ordering tests and/or medications, and counseling the patient.   Shelly Bombard, MD 08/06/2020 12:28 PM

## 2020-08-07 ENCOUNTER — Encounter: Payer: Self-pay | Admitting: Obstetrics and Gynecology

## 2020-09-04 ENCOUNTER — Emergency Department (HOSPITAL_BASED_OUTPATIENT_CLINIC_OR_DEPARTMENT_OTHER): Payer: BC Managed Care – PPO

## 2020-09-04 ENCOUNTER — Observation Stay (HOSPITAL_COMMUNITY)
Admission: EM | Admit: 2020-09-04 | Discharge: 2020-09-06 | Disposition: A | Discharge status: Home / Self Care | Payer: BC Managed Care – PPO | Attending: Internal Medicine | Admitting: Internal Medicine

## 2020-09-04 ENCOUNTER — Encounter (HOSPITAL_COMMUNITY): Payer: Self-pay | Admitting: Emergency Medicine

## 2020-09-04 ENCOUNTER — Other Ambulatory Visit: Payer: Self-pay

## 2020-09-04 ENCOUNTER — Emergency Department (HOSPITAL_COMMUNITY): Payer: BC Managed Care – PPO

## 2020-09-04 DIAGNOSIS — J189 Pneumonia, unspecified organism: Secondary | ICD-10-CM | POA: Diagnosis not present

## 2020-09-04 DIAGNOSIS — D259 Leiomyoma of uterus, unspecified: Secondary | ICD-10-CM | POA: Insufficient documentation

## 2020-09-04 DIAGNOSIS — R0602 Shortness of breath: Secondary | ICD-10-CM

## 2020-09-04 DIAGNOSIS — I2699 Other pulmonary embolism without acute cor pulmonale: Principal | ICD-10-CM | POA: Insufficient documentation

## 2020-09-04 DIAGNOSIS — M79605 Pain in left leg: Secondary | ICD-10-CM | POA: Diagnosis not present

## 2020-09-04 DIAGNOSIS — J181 Lobar pneumonia, unspecified organism: Secondary | ICD-10-CM

## 2020-09-04 DIAGNOSIS — I82402 Acute embolism and thrombosis of unspecified deep veins of left lower extremity: Secondary | ICD-10-CM | POA: Diagnosis present

## 2020-09-04 DIAGNOSIS — I82422 Acute embolism and thrombosis of left iliac vein: Secondary | ICD-10-CM | POA: Insufficient documentation

## 2020-09-04 DIAGNOSIS — Z20822 Contact with and (suspected) exposure to covid-19: Secondary | ICD-10-CM | POA: Insufficient documentation

## 2020-09-04 DIAGNOSIS — D649 Anemia, unspecified: Secondary | ICD-10-CM

## 2020-09-04 LAB — COMPREHENSIVE METABOLIC PANEL
ALT: 18 U/L (ref 0–44)
AST: 20 U/L (ref 15–41)
Albumin: 3.3 g/dL — ABNORMAL LOW (ref 3.5–5.0)
Alkaline Phosphatase: 66 U/L (ref 38–126)
Anion gap: 5 (ref 5–15)
BUN: 9 mg/dL (ref 6–20)
CO2: 25 mmol/L (ref 22–32)
Calcium: 9.5 mg/dL (ref 8.9–10.3)
Chloride: 105 mmol/L (ref 98–111)
Creatinine, Ser: 0.91 mg/dL (ref 0.44–1.00)
GFR, Estimated: >60 mL/min (ref >60)
Glucose, Bld: 91 mg/dL (ref 70–99)
Potassium: 4 mmol/L (ref 3.5–5.1)
Sodium: 135 mmol/L (ref 135–145)
Total Bilirubin: 0.5 mg/dL (ref 0.3–1.2)
Total Protein: 6.9 g/dL (ref 6.5–8.1)

## 2020-09-04 LAB — CBC WITH DIFFERENTIAL/PLATELET
Abs Immature Granulocytes: 0.01 10*3/uL (ref 0.00–0.07)
Basophils Absolute: 0 10*3/uL (ref 0.0–0.1)
Basophils Relative: 1 %
Eosinophils Absolute: 0 10*3/uL (ref 0.0–0.5)
Eosinophils Relative: 1 %
HCT: 32.9 % — ABNORMAL LOW (ref 36.0–46.0)
Hemoglobin: 10.3 g/dL — ABNORMAL LOW (ref 12.0–15.0)
Immature Granulocytes: 0 %
Lymphocytes Relative: 31 %
Lymphs Abs: 1.2 10*3/uL (ref 0.7–4.0)
MCH: 26.2 pg (ref 26.0–34.0)
MCHC: 31.3 g/dL (ref 30.0–36.0)
MCV: 83.7 fL (ref 80.0–100.0)
Monocytes Absolute: 0.4 10*3/uL (ref 0.1–1.0)
Monocytes Relative: 9 %
Neutro Abs: 2.2 10*3/uL (ref 1.7–7.7)
Neutrophils Relative %: 58 %
Platelets: 323 10*3/uL (ref 150–400)
RBC: 3.93 MIL/uL (ref 3.87–5.11)
RDW: 14.6 % (ref 11.5–15.5)
WBC: 3.8 10*3/uL — ABNORMAL LOW (ref 4.0–10.5)
nRBC: 0 % (ref 0.0–0.2)

## 2020-09-04 LAB — PROTIME-INR
INR: 1 (ref 0.8–1.2)
Prothrombin Time: 13.2 seconds (ref 11.4–15.2)

## 2020-09-04 LAB — TROPONIN I (HIGH SENSITIVITY)
Troponin I (High Sensitivity): <2 ng/L (ref <18)
Troponin I (High Sensitivity): <2 ng/L (ref <18)

## 2020-09-04 LAB — I-STAT BETA HCG BLOOD, ED (MC, WL, AP ONLY): I-stat hCG, quantitative: <5 m[IU]/mL (ref <5)

## 2020-09-04 MED ORDER — HEPARIN (PORCINE) 25000 UT/250ML-% IV SOLN
1700.0000 [IU]/h | INTRAVENOUS | Status: DC
  Administered 2020-09-04: 1500 [IU]/h via INTRAVENOUS
  Filled 2020-09-04 (×2): qty 250

## 2020-09-04 MED ORDER — IOHEXOL 350 MG/ML SOLN
50.0000 mL | Freq: Once | INTRAVENOUS | Status: AC | PRN
  Administered 2020-09-04: 50 mL via INTRAVENOUS

## 2020-09-04 MED ORDER — HEPARIN BOLUS VIA INFUSION
5000.0000 [IU] | Freq: Once | INTRAVENOUS | Status: AC
  Administered 2020-09-04: 5000 [IU] via INTRAVENOUS
  Filled 2020-09-04: qty 5000

## 2020-09-04 NOTE — ED Provider Notes (Signed)
Emergency Medicine Provider Triage Evaluation Note  Felicia Pham , a 40 y.o. female  was evaluated in triage.  Pt complains of suspected DVT in the left lower extremity.  She states she was undergoing MRI at another facility for evaluation of uterine fibroids and a potential thrombus in the left iliac was incidentally noted.  She notes some discomfort to the left calf and thigh over the last week.  Some chest discomfort and shortness of breath as well.  Review of Systems  Positive: Chest discomfort, possible DVT Negative: Syncope, dizziness, vomiting  Physical Exam  BP 136/74 (BP Location: Left Arm)   Pulse 95   Temp 98.4 F (36.9 C) (Oral)   Resp 17   SpO2 100%  Gen:   Awake, no distress   Resp:  Normal effort  MSK:   Moves extremities without difficulty  Other:    Medical Decision Making  Medically screening exam initiated at 5:57 PM.  Appropriate orders placed.  Chanee Henrickson Bokhari was informed that the remainder of the evaluation will be completed by another provider, this initial triage assessment does not replace that evaluation, and the importance of remaining in the ED until their evaluation is complete.     Lorayne Bender, PA-C 09/04/20 1815    Drenda Freeze, MD 09/05/20 [phone]

## 2020-09-04 NOTE — ED Notes (Addendum)
Ambulated pt with pulse oxygen saturation dropped to 96, PA Johana notified

## 2020-09-04 NOTE — Progress Notes (Signed)
ANTICOAGULATION CONSULT NOTE - Initial Consult  Pharmacy Consult for Heparin Indication: pulmonary embolus, possible LLE DVT  No Known Allergies  Patient Measurements: Height: 5\' 4"  (162.6 cm) Weight: 130.5 kg (287 lb 11.2 oz) IBW/kg (Calculated) : 54.7 Heparin Dosing Weight: 86 kg  Vital Signs: Temp: 98.4 F (36.9 C) (05/24 1635) Temp Source: Oral (05/24 1635) BP: 140/83 (05/24 2245) Pulse Rate: 78 (05/24 2245)  Labs: Recent Labs    09/04/20 1839 09/04/20 2001  HGB 10.3*  --   HCT 32.9*  --   PLT 323  --   LABPROT 13.2  --   INR 1.0  --   CREATININE 0.91  --   TROPONINIHS <2 <2    Estimated Creatinine Clearance: 112.5 mL/min (by C-G formula based on SCr of 0.91 mg/dL).   Medical History: Past Medical History:  Diagnosis Date  . Rib fracture   . Uterine fibroid     Medications:  Awaiting home med rec  Assessment: 40 y.o. F presents with LLE DVT seen on MRI at another facility. Preliminary LLE venous duplex here shows no DVT.  CT shows pulmonary artery emboli extending from the left interlobar pulmonary artery into the lobar and segmental branches of the left lower lobe. RV/LV ratio is top-normal at 0.88 without other CT features of right heart strain. To begin heparin per pharmacy. No AC PTA. Hgb 10.3 on admission, plt wnl.  Goal of Therapy:  Heparin level 0.3-0.7 units/ml Monitor platelets by anticoagulation protocol: Yes   Plan:  Heparin IV bolus 5000 units Heparin gtt at 1500 units/hr Will f/u heparin level in 6 hours Daily heparin level and CBC  Sherlon Handing, PharmD, BCPS Please see amion for complete clinical pharmacist phone list 09/04/2020,11:14 PM

## 2020-09-04 NOTE — ED Triage Notes (Signed)
Pt sent for evaluation of DVT to left leg. Reports pain and swelling to left calf. Pt had an MRI of her pelvis, and DVT was noted. Reports intermittent chest pain/shortness of breath.

## 2020-09-04 NOTE — Progress Notes (Signed)
LLE venous duplex has been completed.  Preliminary results given to [facility], [place].  Results can be found under chart review under CV PROC. 09/04/2020 6:54 PM Bentleigh Stankus RVT, RDMS

## 2020-09-04 NOTE — ED Notes (Signed)
Attempted to obtain MSE signature no answer 3X.

## 2020-09-04 NOTE — ED Provider Notes (Signed)
[place] EMERGENCY DEPARTMENT Provider Note   CSN: [id number] Arrival date & time: 09/04/20  1618     History Chief Complaint  Patient presents with  . DVT    Felicia Pham is a 40 y.o. female.  40 y.o female with a PMH of HTN, Hyperlipidemia, Bronchitis presents to the ED with a chief complaint of left leg swelling and pain x 4 days.  Patient reports she was evaluated by her gynecologist, had an MRI of her pelvis to supervise size of her known fibroids.  Reports she was called by the office in order to be seen in the emergency department due to a DVT present.  Patient reports she has been having some pain to her left leg along with left calf which began on Friday.  There is no alleviating or exacerbating factors, she has not taken any medication to address these complaints.  She does have a prior history of clot in her abdomen, after her C-section.  Also endorses chest pain, begins to describe a "like something is sitting there ", to the substernal region.  She also has started coughing for the past 4 days.  She has taken some Zyrtec's to help with an itchy throat.  She does not have any prior history of blood clots, no blood thinners currently.  Denies any other symptoms.  The history is provided by the patient and medical records.       Past Medical History:  Diagnosis Date  . Rib fracture   . Uterine fibroid     Patient Active Problem List   Diagnosis Date Noted  . Lower abdominal pain 03/30/2018  . Abnormal glucose 03/30/2018  . Acute bronchitis 03/30/2018  . Acute pharyngitis, unspecified 03/30/2018  . Backache 03/30/2018  . Chest pain 03/30/2018  . Diarrhea, unspecified 03/30/2018  . Dizziness and giddiness 03/30/2018  . Generalized muscle weakness 03/30/2018  . Goiter 03/30/2018  . Malignant essential hypertension 03/30/2018  . Mixed hyperlipidemia 03/30/2018  . Body mass index 37.0-37.9, adult 03/30/2018  . Nausea 03/30/2018  . Other long  term (current) drug therapy 03/30/2018  . Other malaise and fatigue 03/30/2018  . Pain in left leg 03/30/2018  . Gastroesophageal reflux disease 06/09/2017  . 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 Myalgia and myositis 05/26/2013  . Pain in joint, shoulder region 05/26/2013  . Other iron deficiency anemias 02/25/2013  . Pelvic pain in female 11/17/2012  . Other and unspecified ovarian cyst 11/02/2012  . Leiomyoma of uterus, unspecified 08/05/2012  . Impaired fasting glucose 01/31/2010  . Need for prophylactic vaccination and inoculation against influenza 01/31/2010    Past Surgical History:  Procedure Laterality Date  . MYOMECTOMY  2014     OB History    Gravida  0   Para  0   Term  0   Preterm  0   AB  0   Living  0     SAB  0   IAB  0   Ectopic  0   Multiple  0   Live Births              Family History  Problem Relation Age of Onset  . Lupus Mother   . Hypertension Mother   . Hypertension Father   . Diabetes Father   . Heart attack Father 39  . Diabetes Maternal Grandmother   . Diabetes Paternal Grandmother  Social History   Tobacco Use  . Smoking status: Never Smoker  . Smokeless tobacco: Never Used  Vaping Use  . Vaping Use: Never used  Substance Use Topics  . Alcohol use: No  . Drug use: No    Home Medications Prior to Admission medications   Medication Sig Start Date End Date Taking? Authorizing Provider  BACLOFEN PO Take by mouth.    [provider]  cetirizine (ZYRTEC ALLERGY) 10 MG tablet Take 1 tablet (10 mg total) by mouth daily. 11/11/19   Darr, Edison Nasuti, PA-C  cyclobenzaprine (FLEXERIL) 10 MG tablet Take 1 tablet (10 mg total) by mouth 2 (two) times daily as needed for muscle spasms. 08/16/19   Henderly, Britni A, PA-C  drospirenone-ethinyl estradiol (YAZ) 3-0.02 MG tablet Take 1 tablet by mouth daily. 05/07/20   [provider]  famotidine (PEPCID) 20 MG tablet Take 1 tablet (20 mg total) by mouth 2 (two) times daily. Patient not taking: Reported on 05/07/2020 11/11/19   Darr, Edison Nasuti, PA-C  hydrocortisone 2.5 % cream Apply 1 application topically daily.  Patient not taking: Reported on 05/07/2020 01/31/18   [provider]  ibuprofen (ADVIL,MOTRIN) 200 MG tablet Take 600-800 mg by mouth every 6 (six) hours as needed for moderate pain. Reported on 09/25/2015    [provider]  lidocaine (LIDODERM) 5 % Place 1 patch onto the skin daily. Remove & Discard patch within 12 hours or as directed by MD Patient not taking: Reported on 05/07/2020 08/16/19   Henderly, Britni A, PA-C  meloxicam (MOBIC) 7.5 MG tablet     [provider]  metroNIDAZOLE (FLAGYL) 500 MG tablet Take 1 tablet (500 mg total) by mouth 2 (two) times daily. 05/09/20   Shelly Bombard, MD    Allergies    Patient has no known allergies.  Review of Systems   Review of Systems  Constitutional: Negative for fever.  HENT: Positive for rhinorrhea, sinus pressure and sore throat.   Respiratory: Positive for shortness of breath.   Cardiovascular: Positive for chest pain and leg swelling.  Gastrointestinal: Negative for abdominal pain, nausea and vomiting.  Genitourinary: Negative for flank pain.  Musculoskeletal: Negative for back pain.  Skin: Negative for pallor and wound.  Neurological: Negative for light-headedness.  All other systems reviewed and are negative.   Physical Exam Updated Vital Signs BP 137/88   Pulse 74   Temp 98.4 F (36.9 C) (Oral)   Resp 20   Ht 5\' 4"  (1.626 m)   Wt 130.5 kg   SpO2 100%   BMI 49.38 kg/m   Physical Exam Vitals and nursing note reviewed.  Constitutional:      Appearance: Normal appearance.  HENT:     Head: Normocephalic and atraumatic.     Nose: Nose normal.     Mouth/Throat:     Mouth: Mucous membranes are moist.     Pharynx: Posterior oropharyngeal erythema present.   Eyes:     Pupils: Pupils are equal, round, and reactive to light.  Cardiovascular:     Rate and Rhythm: Normal rate.     Comments: No bilateral pitting edema.  Pain with palpation of the left calf. Pulmonary:     Effort: Pulmonary effort is normal.     Breath sounds: Examination of the right-upper field reveals decreased breath sounds. Examination of the right-middle field reveals decreased breath sounds. Examination of the right-lower field reveals decreased breath sounds. Examination of the left-lower field reveals decreased breath sounds. Decreased breath  sounds present. No wheezing.     Comments: Lungs are diminished to auscultation but without any wheezing. Abdominal:     General: Abdomen is flat.     Tenderness: There is abdominal tenderness. There is no right CVA tenderness or left CVA tenderness.     Comments: Pain with palpation to the prepubic area.  Bowel sounds are present and equal.  Musculoskeletal:        General: Tenderness present.     Cervical back: Normal range of motion and neck supple.     Right lower leg: No edema.     Left lower leg: No edema.  Skin:    General: Skin is warm and dry.  Neurological:     Mental Status: She is alert and oriented to person, place, and time.     ED Results / Procedures / Treatments   Labs (all labs ordered are listed, but only abnormal results are displayed) Labs Reviewed  COMPREHENSIVE METABOLIC PANEL - Abnormal; Notable for the following components:      Result Value   Albumin 3.3 (*)    All other components within normal limits  CBC WITH DIFFERENTIAL/PLATELET - Abnormal; Notable for the following components:   WBC 3.8 (*)    Hemoglobin 10.3 (*)    HCT 32.9 (*)    All other components within normal limits  SARS CORONAVIRUS 2 (TAT 6-24 HRS)  PROTIME-INR  HEPARIN LEVEL (UNFRACTIONATED)  CBC  I-STAT BETA HCG BLOOD, ED ([facility], [facility], [facility] ONLY)  TROPONIN I (HIGH SENSITIVITY)  TROPONIN I (HIGH SENSITIVITY)    EKG EKG  Interpretation  Date/Time:  Tuesday Sep 04 2020 17:57:58 EDT Ventricular Rate:  89 PR Interval:  120 QRS Duration: 74 QT Interval:  324 QTC Calculation: 394 R Axis:   34 Text Interpretation: Normal sinus rhythm Nonspecific T wave abnormality Abnormal ECG When compared to prior,  similar to prior. No STEMI Confirmed by Antony Blackbird [phone]) on 09/04/2020 9:07:42 PM   Radiology DG Chest 2 View  Result Date: 09/04/2020 CLINICAL DATA:  Chest pain.  Possible deep venous thrombosis. EXAM: CHEST - 2 VIEW COMPARISON:  08/16/2019 FINDINGS: Heart size is normal. Mediastinal shadows are normal. Left lung is clear. There is perihilar opacity in the right lung suggesting pneumonia. No lobar consolidation or collapse. No effusion. No acute bone finding. IMPRESSION: Perihilar opacity of the right lung consistent with pneumonia. Electronically Signed   By: Nelson Chimes M.D.   On: 09/04/2020 19:20   CT Angio Chest PE W and/or Wo Contrast  Result Date: 09/04/2020 CLINICAL DATA:  Pain and swelling of the left calf, DVT noted on pelvic MRI EXAM: CT ANGIOGRAPHY CHEST WITH CONTRAST TECHNIQUE: Multidetector CT imaging of the chest was performed using the standard protocol during bolus administration of intravenous contrast. Multiplanar CT image reconstructions and MIPs were obtained to evaluate the vascular anatomy. CONTRAST:  32mL OMNIPAQUE IOHEXOL 350 MG/ML SOLN COMPARISON:  The pelvic MR or biliary air. CT angiography 07/18/2017 FINDINGS: Cardiovascular: Satisfactory opacification pulmonary arteries. No demonstrable right-sided pulmonary artery emboli to the segmental level with more distal evaluation limited by respiratory motion artifact there is evidence filling defect seen extending from the orifice of the lingular artery into the left inter lobar and left lower lobe are pulmonary artery as well as throughout the segmental branches of the left lower lobe. Central pulmonary arterial caliber is within normal  limits. RV/LV ratio is top-normal at 0.88. Normal heart size. No pericardial effusion. The aortic root is suboptimally assessed given  cardiac pulsation artifact. Suboptimal opacification of the thoracic aorta on this non tailored exam. No gross aortic abnormality. No periaortic stranding or hemorrhage is evident. Mediastinum/Nodes: No mediastinal fluid or gas. Normal thyroid gland and thoracic inlet. No acute abnormality of the trachea or esophagus. No worrisome mediastinal, hilar or axillary adenopathy. Lungs/Pleura: Diffuse mild airways thickening. Patchy consolidation and branching tree-in-bud nodularity are seen in the right lower lobe. No other focal airspace opacity. Minimal atelectasis. No pneumothorax or visible effusion. No concerning pulmonary nodules or masses. Upper Abdomen: Scattered colonic diverticula without focal inflammation to suggest diverticulitis. No acute abnormalities present in the visualized portions of the upper abdomen. Musculoskeletal: No acute osseous abnormality or suspicious osseous lesion. Review of the MIP images confirms the above findings. IMPRESSION: Pulmonary artery emboli extending from the left interlobar pulmonary artery into the lobar and segmental branches of the left lower lobe. RV/LV ratio is top-normal at 0.88 without other CT features of right heart strain. If there is persisting concern, echocardiogram could be obtained. No other significant pulmonary embolic burden to the segmental level. Airways thickening and scattered secretions with more patchy consolidative and tree-in-bud nodularity within the superior segment right lower lobe. Could reflect pneumonia, aspiration or other infectious/inflammatory process in the appropriate clinical setting. Infarct less favored in the absence of discernible emboli to this distribution. These results were called by telephone at the time of interpretation on 09/04/2020 at 10:54 pm to provider [facility] , who verbally acknowledged  these results. Electronically Signed   By: Lovena Le M.D.   On: 09/04/2020 22:54   VAS [place] LOWER EXTREMITY VENOUS (DVT) (ONLY [facility] & [facility])  Result Date: 09/04/2020  Lower Venous DVT Study Patient Name:  BILLEE BALCERZAK  Date of Exam:   09/04/2020 Medical Rec #: [id number]         Accession #:    [id number] Date of Birth: May 01, 1980         Patient Gender: F Patient Age:   71Y Exam Location:  [facility] Procedure:      VAS [place] LOWER EXTREMITY VENOUS (DVT) Referring Phys: [id number] [place] --------------------------------------------------------------------------------  Indications: Pain.  Limitations: Body habitus and poor ultrasound/tissue interface. Comparison Study: Previous exam 09/07/15 - negative Performing Technologist: Rogelia Rohrer  Examination Guidelines: A complete evaluation includes B-mode imaging, spectral Doppler, color Doppler, and power Doppler as needed of all accessible portions of each vessel. Bilateral testing is considered an integral part of a complete examination. Limited examinations for reoccurring indications may be performed as noted. The reflux portion of the exam is performed with the patient in reverse Trendelenburg.  +-----+---------------+---------+-----------+----------+--------------+ RIGHTCompressibilityPhasicitySpontaneityPropertiesThrombus Aging +-----+---------------+---------+-----------+----------+--------------+ CFV  Full           Yes      Yes                                 +-----+---------------+---------+-----------+----------+--------------+   +---------+---------------+---------+-----------+----------+--------------+ LEFT     CompressibilityPhasicitySpontaneityPropertiesThrombus Aging +---------+---------------+---------+-----------+----------+--------------+ CFV      Full           Yes      Yes                                 +---------+---------------+---------+-----------+----------+--------------+ SFJ      Full                                                         +---------+---------------+---------+-----------+----------+--------------+  FV Prox  Full           Yes      Yes                                 +---------+---------------+---------+-----------+----------+--------------+ FV Mid   Full           Yes      Yes                                 +---------+---------------+---------+-----------+----------+--------------+ FV DistalFull           Yes      Yes                                 +---------+---------------+---------+-----------+----------+--------------+ PFV      Full                                                        +---------+---------------+---------+-----------+----------+--------------+ POP      Full           Yes      Yes                                 +---------+---------------+---------+-----------+----------+--------------+ PTV      Full                                                        +---------+---------------+---------+-----------+----------+--------------+ PERO     Full                                                        +---------+---------------+---------+-----------+----------+--------------+     Summary: RIGHT: - No evidence of common femoral vein obstruction.  LEFT: - There is no evidence of deep vein thrombosis in the lower extremity. - There is no evidence of superficial venous thrombosis.  - No cystic structure found in the popliteal fossa.  *See table(s) above for measurements and observations.    Preliminary     Procedures .Critical Care Performed by: Felicia Fitting, PA-C Authorized by: Felicia Fitting, PA-C   Critical care provider statement:    Critical care time (minutes):  35   Critical care start time:  09/04/2020 10:30 PM   Critical care end time:  09/04/2020 11:05 PM   Critical care time was exclusive of:  Separately billable procedures and treating other patients   Critical care was necessary to treat or prevent imminent or  life-threatening deterioration of the following conditions:  Circulatory failure   Critical care was time spent personally by me on the following activities:  Blood draw for specimens, development of treatment plan with patient or surrogate, discussions with consultants, evaluation of patient's response to treatment, examination of patient, obtaining history from patient or  surrogate, ordering and performing treatments and interventions, ordering and review of laboratory studies, ordering and review of radiographic studies, pulse oximetry, re-evaluation of patient's condition and review of old charts     Medications Ordered in ED Medications  heparin bolus via infusion 5,000 Units (has no administration in time range)  heparin ADULT infusion 100 units/mL (25000 units/234mL) (has no administration in time range)  iohexol (OMNIPAQUE) 350 MG/ML injection 50 mL (50 mLs Intravenous Contrast Given 09/04/20 2222)    ED Course  I have reviewed the triage vital signs and the nursing notes.  Pertinent labs & imaging results that were available during my care of the patient were reviewed by me and considered in my medical decision making (see chart for details).  Clinical Course as of 09/04/20 2339  Tue Sep 04, 2020  2131 I-stat hCG, quantitative: <5.0 [JS]    Clinical Course User Index [JS] Felicia Fitting, PA-C   MDM Rules/Calculators/A&P   Patient presents to the ED after having MRI of her pelvis to assess for her ongoing fibroids and pelvic pain.  She was called as there was a finding positive for a DVT on her study.  According to her records obtained from [facility] radiology.  A probable thrombus within the left internal iliac vein was present, this was of age indeterminate.  He does report having some pain along her left leg, this has been ongoing for the past 4 days along with pain to her calf increasing in nature to pain along her pelvic area.  She denies any prior history of blood clots, no prior  clotting disorders, currently not on any blood thinners.  No trauma.  During evaluation she is overall nontoxic-appearing.  Does have some nasal congestion, sinus pressure, oropharynx is erythematous without any exudates present.  Lungs are diminished to auscultation especially on the right base.  Abdomen is soft, with some pain to palpation along the pelvic region.  Negative McBurney sign.  There is some pain with palpation of the left leg, especially around the calf region.  No pitting edema noted.  Vitals on arrival are within normal limits, aside from slight elevation of her blood pressure with a diastolic number in the 161W.  No tachycardia satting at 100% on room air.  Patient was seen by me 5 hours after her arrival in the ED, she was MSE by prior provider and had labs ordered.  Her pregnancy test is negative.  Her CBC is remarkable for mild leukopenia, COVID-19 test has been ordered as patient does have some URI symptoms on today's visit.  CMP without any electrolyte derangement, creatinine levels within normal limits.  LFTs are unremarkable.  PT and INR normal.  We discussed further evaluation of her shortness of breath along with chest pain via CT chest to rule out PE.  X-ray of her chest that showed: Perihilar opacity of the right lung consistent with pneumonia.     We will also ambulate patient in order to evaluate for any hypoxia.  Spoke to radiology Pulmonary emboli left lower lobe.No right heart strain. Opacity to the right lung.  CT Angio chest showed:  Pulmonary artery emboli extending from the left interlobar pulmonary  artery into the lobar and segmental branches of the left lower lobe.  RV/LV ratio is top-normal at 0.88 without other CT features of right  heart strain. If there is persisting concern, echocardiogram could  be obtained.    No other significant pulmonary embolic burden to the segmental  level.  Airways thickening and scattered secretions with more  patchy  consolidative and tree-in-bud nodularity within the superior segment  right lower lobe. Could reflect pneumonia, aspiration or other  infectious/inflammatory process in the appropriate clinical setting.  Infarct less favored in the absence of discernible emboli to this  distribution.    These results were called by telephone at the time of interpretation  on 09/04/2020 at 10:54 pm to provider [facility] , who verbally  acknowledged these results.   11:10 PM Heparin has been ordered for patient.   Call placed to hospitalist for admission.   11:39 PM Spoke to hospitalist service who will admit patient for further management.   Portions of this note were generated with Lobbyist. Dictation errors may occur despite best attempts at proofreading.  Final Clinical Impression(s) / ED Diagnoses Final diagnoses:  Shortness of breath  Other acute pulmonary embolism, unspecified whether acute cor pulmonale present [facility])  Community acquired pneumonia of right lower lobe of lung    Rx / DC Orders ED Discharge Orders    None       Felicia Pham, Hershal Coria 09/04/20 2339    Tegeler, Gwenyth Allegra, MD 09/05/20 Laureen Abrahams

## 2020-09-04 NOTE — ED Notes (Signed)
Swelling to left leg and calf. Pt denies any chest pain, or any n,v,diarhea

## 2020-09-05 ENCOUNTER — Observation Stay (HOSPITAL_BASED_OUTPATIENT_CLINIC_OR_DEPARTMENT_OTHER): Payer: BC Managed Care – PPO

## 2020-09-05 ENCOUNTER — Other Ambulatory Visit (HOSPITAL_COMMUNITY): Payer: Self-pay

## 2020-09-05 DIAGNOSIS — I2693 Single subsegmental pulmonary embolism without acute cor pulmonale: Secondary | ICD-10-CM

## 2020-09-05 DIAGNOSIS — J189 Pneumonia, unspecified organism: Secondary | ICD-10-CM

## 2020-09-05 DIAGNOSIS — D528 Other folate deficiency anemias: Secondary | ICD-10-CM

## 2020-09-05 DIAGNOSIS — D649 Anemia, unspecified: Secondary | ICD-10-CM | POA: Diagnosis not present

## 2020-09-05 DIAGNOSIS — I2694 Multiple subsegmental pulmonary emboli without acute cor pulmonale: Secondary | ICD-10-CM | POA: Diagnosis not present

## 2020-09-05 DIAGNOSIS — J181 Lobar pneumonia, unspecified organism: Secondary | ICD-10-CM

## 2020-09-05 DIAGNOSIS — D259 Leiomyoma of uterus, unspecified: Secondary | ICD-10-CM

## 2020-09-05 DIAGNOSIS — I2601 Septic pulmonary embolism with acute cor pulmonale: Secondary | ICD-10-CM

## 2020-09-05 DIAGNOSIS — I2699 Other pulmonary embolism without acute cor pulmonale: Secondary | ICD-10-CM | POA: Diagnosis present

## 2020-09-05 LAB — ECHOCARDIOGRAM COMPLETE
AV Mean grad: 4 mmHg
AV Peak grad: 9.5 mmHg
Ao pk vel: 1.54 m/s
Area-P 1/2: 4.49 cm2
Calc EF: 65.4 %
Height: 64 in
S' Lateral: 2.2 cm
Single Plane A2C EF: 64.4 %
Single Plane A4C EF: 65.9 %
Weight: 4603.2 oz

## 2020-09-05 LAB — FOLATE: Folate: 30.9 ng/mL (ref >5.9)

## 2020-09-05 LAB — BASIC METABOLIC PANEL
Anion gap: 10 (ref 5–15)
BUN: 7 mg/dL (ref 6–20)
CO2: 22 mmol/L (ref 22–32)
Calcium: 9.3 mg/dL (ref 8.9–10.3)
Chloride: 103 mmol/L (ref 98–111)
Creatinine, Ser: 0.91 mg/dL (ref 0.44–1.00)
GFR, Estimated: >60 mL/min (ref >60)
Glucose, Bld: 96 mg/dL (ref 70–99)
Potassium: 3.9 mmol/L (ref 3.5–5.1)
Sodium: 135 mmol/L (ref 135–145)

## 2020-09-05 LAB — CBC
HCT: 32.7 % — ABNORMAL LOW (ref 36.0–46.0)
Hemoglobin: 10.2 g/dL — ABNORMAL LOW (ref 12.0–15.0)
MCH: 26.1 pg (ref 26.0–34.0)
MCHC: 31.2 g/dL (ref 30.0–36.0)
MCV: 83.6 fL (ref 80.0–100.0)
Platelets: 291 10*3/uL (ref 150–400)
RBC: 3.91 MIL/uL (ref 3.87–5.11)
RDW: 14.8 % (ref 11.5–15.5)
WBC: 4.5 10*3/uL (ref 4.0–10.5)
nRBC: 0 % (ref 0.0–0.2)

## 2020-09-05 LAB — VITAMIN B12: Vitamin B-12: 250 pg/mL (ref 180–914)

## 2020-09-05 LAB — IRON AND TIBC
Iron: 26 ug/dL — ABNORMAL LOW (ref 28–170)
Saturation Ratios: 6 % — ABNORMAL LOW (ref 10.4–31.8)
TIBC: 434 ug/dL (ref 250–450)
UIBC: 408 ug/dL

## 2020-09-05 LAB — HEPARIN LEVEL (UNFRACTIONATED)
Heparin Unfractionated: 0.25 IU/mL — ABNORMAL LOW (ref 0.30–0.70)
Heparin Unfractionated: 0.39 IU/mL (ref 0.30–0.70)

## 2020-09-05 LAB — SARS CORONAVIRUS 2 (TAT 6-24 HRS): SARS Coronavirus 2: NEGATIVE

## 2020-09-05 MED ORDER — OXYMETAZOLINE HCL 0.05 % NA SOLN
1.0000 | Freq: Two times a day (BID) | NASAL | Status: DC
  Administered 2020-09-05 – 2020-09-06 (×2): 1 via NASAL
  Filled 2020-09-05: qty 30

## 2020-09-05 MED ORDER — HEPARIN BOLUS VIA INFUSION
1500.0000 [IU] | Freq: Once | INTRAVENOUS | Status: AC
  Administered 2020-09-05: 1500 [IU] via INTRAVENOUS
  Filled 2020-09-05: qty 1500

## 2020-09-05 MED ORDER — ACETAMINOPHEN 325 MG PO TABS
650.0000 mg | ORAL_TABLET | Freq: Four times a day (QID) | ORAL | Status: DC | PRN
  Administered 2020-09-05 – 2020-09-06 (×3): 650 mg via ORAL
  Filled 2020-09-05 (×3): qty 2

## 2020-09-05 NOTE — TOC Benefit Eligibility Note (Signed)
Patient Teacher, English as a foreign language completed.    The patient is currently admitted and upon discharge could be taking Eliquis Starter Pack.  The current 30 day co-pay is, $30.00.   The patient is currently admitted and upon discharge could be taking Xarelto Starter Pack.  The current 30 day co-pay is, $30.00.   The patient is insured through [place], [place] Patient Advocate Specialist [place] Team Direct Number: [phone]  Fax: [phone]

## 2020-09-05 NOTE — Progress Notes (Signed)
ANTICOAGULATION CONSULT NOTE   Pharmacy Consult for Heparin Indication: pulmonary embolus, possible LLE DVT  No Known Allergies  Patient Measurements: Height: 5\' 4"  (162.6 cm) Weight: 130.5 kg (287 lb 11.2 oz) IBW/kg (Calculated) : 54.7 Heparin Dosing Weight: 86 kg  Vital Signs: Temp: 99.6 F (37.6 C) (05/25 1235) Temp Source: Oral (05/25 1235) BP: 141/95 (05/25 1235) Pulse Rate: 83 (05/25 1235)  Labs: Recent Labs    09/04/20 1839 09/04/20 2001 09/05/20 0535 09/05/20 0536 09/05/20 1430  HGB 10.3*  --   --  10.2*  --   HCT 32.9*  --   --  32.7*  --   PLT 323  --   --  291  --   LABPROT 13.2  --   --   --   --   INR 1.0  --   --   --   --   HEPARINUNFRC  --   --  0.25*  --  0.39  CREATININE 0.91  --   --  0.91  --   TROPONINIHS <2 <2  --   --   --     Estimated Creatinine Clearance: 112.5 mL/min (by C-G formula based on SCr of 0.91 mg/dL).   Medical History: Past Medical History:  Diagnosis Date  . Rib fracture   . Uterine fibroid     Medications:  Awaiting home med rec  Assessment: 40 y.o. F presents with LLE DVT seen on MRI at another facility. Preliminary LLE venous duplex here shows no DVT.  CT shows pulmonary artery emboli extending from the left interlobar pulmonary artery into the lobar and segmental branches of the left lower lobe. RV/LV ratio is top-normal at 0.88 without other CT features of right heart strain. To begin heparin per pharmacy. No AC PTA. Hgb 10.3 on admission, plt wnl.  Heparin level came back therapeutic at 0.39, on 1700 units/hr. CBC stable. No s/sx of bleeding or infusion issues.    Goal of Therapy:  Heparin level 0.3-0.7 units/ml Monitor platelets by anticoagulation protocol: Yes   Plan:  Continue heparin gtt at 1700 units/hr Monitor CBC, HL, and for s/sx of bleeding   Antonietta Jewel, PharmD, BCCCP Clinical Pharmacist  Phone: [phone] 09/05/2020 3:10 PM  Please check AMION for all [place] phone numbers After 10:00 PM,  call [place] [phone]

## 2020-09-05 NOTE — H&P (Signed)
History and Physical    Felicia Pham:[id number] DOB: 07/08/1980 DOA: 09/04/2020  PCP: Janie Morning, DO  Patient coming from: home  I have personally briefly reviewed patient's old medical records in [place]  Chief Complaint: abnormal MRI  HPI: Felicia Pham is a 40 y.o. female with medical history significant for uterine leiomyoma with fibroid, hypertension, and hyperlipidemia who presents after a left internal iliac vein thrombus was seen on MRI.  Patient was following with OB/GYN to evaluate her uterine fibroid and recent pelvic pain.  She was sent for an MRI of the pelvis and it resulted with a probable thrombus within the left internal iliac vein that was age-indeterminate and she was sent to the ED for further evaluation.  Patient is on a hormonal birth control.  States she started it back in January of this year.  She had been off of birth control for 2 years.  States about 4 days ago she began to note left calf pain and swelling.  Then the following day she traveled about 3-1/2 hours.  She also travels an hour for work each day.  Denies any shortness of breath.  Denies any previous history of DVT or PE.  Denies any family of hypercoagulable disorder. She denies any tobacco use.  No history of migraines.  ED Course: She was afebrile and normotensive on room air.  CTA of the chest was obtained and showed pulmonary emboli of the left interlobar pulmonary artery into the segmental branches of the left lower lobe without findings of heart strain.  Troponin also negative.  There is also questionable right-sided patchy consolidation.  COVID-19 test pending.  Review of Systems:  Constitutional: No Weight Change, No Fever ENT/Mouth: No sore throat, No Rhinorrhea Eyes: No Eye Pain, No Vision Changes Cardiovascular: No Chest Pain, no SOB Respiratory: No Cough, No Sputum, No Wheezing, no Dyspnea  Gastrointestinal: No Nausea, No Vomiting, No Diarrhea, No Constipation, No  Pain Genitourinary: no Urinary Incontinence, No Urgency, No Flank Pain Musculoskeletal: No Arthralgias, No Myalgias Skin: No Skin Lesions, No Pruritus, Neuro: no Weakness, No Numbness Psych: No Anxiety/Panic, No Depression, no decrease appetite Heme/Lymph: No Bruising, No Bleeding  Past Medical History:  Diagnosis Date  . Rib fracture   . Uterine fibroid     Past Surgical History:  Procedure Laterality Date  . MYOMECTOMY  2014     reports that she has never smoked. She has never used smokeless tobacco. She reports that she does not drink alcohol and does not use drugs. Social History  No Known Allergies  Family History  Problem Relation Age of Onset  . Lupus Mother   . Hypertension Mother   . Hypertension Father   . Diabetes Father   . Heart attack Father 54  . Diabetes Maternal Grandmother   . Diabetes Paternal Grandmother      Prior to Admission medications   Medication Sig Start Date End Date Taking? Authorizing Provider  BACLOFEN PO Take by mouth.    [provider]  cetirizine (ZYRTEC ALLERGY) 10 MG tablet Take 1 tablet (10 mg total) by mouth daily. 11/11/19   Darr, Edison Nasuti, PA-C  cyclobenzaprine (FLEXERIL) 10 MG tablet Take 1 tablet (10 mg total) by mouth 2 (two) times daily as needed for muscle spasms. 08/16/19   Henderly, Britni A, PA-C  drospirenone-ethinyl estradiol (YAZ) 3-0.02 MG tablet Take 1 tablet by mouth daily. 05/07/20   [provider]  famotidine (PEPCID) 20 MG tablet Take 1 tablet (20  mg total) by mouth 2 (two) times daily. Patient not taking: Reported on 05/07/2020 11/11/19   Darr, Edison Nasuti, PA-C  hydrocortisone 2.5 % cream Apply 1 application topically daily.  Patient not taking: Reported on 05/07/2020 01/31/18   [provider]  ibuprofen (ADVIL,MOTRIN) 200 MG tablet Take 600-800 mg by mouth every 6 (six) hours as needed for moderate pain. Reported on 09/25/2015    [provider]  lidocaine (LIDODERM) 5 % Place 1 patch  onto the skin daily. Remove & Discard patch within 12 hours or as directed by MD Patient not taking: Reported on 05/07/2020 08/16/19   Henderly, Britni A, PA-C  meloxicam (MOBIC) 7.5 MG tablet     [provider]  metroNIDAZOLE (FLAGYL) 500 MG tablet Take 1 tablet (500 mg total) by mouth 2 (two) times daily. 05/09/20   Shelly Bombard, MD    Physical Exam: Vitals:   09/04/20 2319 09/04/20 2330 09/04/20 2345 09/05/20 0000  BP: 137/88 130/85 128/82 129/86  Pulse: 74 84 85 84  Resp: 20  20   Temp:      TempSrc:      SpO2: 100% 100% 97% 100%  Weight:      Height:        Constitutional: NAD, calm, comfortable, morbidly obese female laying at 40 degree incline in bed Vitals:   09/04/20 2319 09/04/20 2330 09/04/20 2345 09/05/20 0000  BP: 137/88 130/85 128/82 129/86  Pulse: 74 84 85 84  Resp: 20  20   Temp:      TempSrc:      SpO2: 100% 100% 97% 100%  Weight:      Height:       Eyes: PERRL, lids and conjunctivae normal ENMT: Mucous membranes are moist.  Neck: normal, supple Respiratory: clear to auscultation bilaterally, no wheezing, no crackles. Normal respiratory effort. No accessory muscle use.  Cardiovascular: Regular rate and rhythm, no murmurs / rubs / gallops.  Nonpitting edema of bilateral pretibial region of the lower extremity.   Abdomen: no tenderness, no masses palpated.  Bowel sounds positive.  Musculoskeletal: no clubbing / cyanosis. No joint deformity upper and lower extremities. Good ROM, no contractures. Normal muscle tone.  Skin: no rashes, lesions, ulcers. No induration Neurologic: CN 2-12 grossly intact. Sensation intact,  Strength 5/5 in all 4.  Psychiatric: Normal judgment and insight. Alert and oriented x 3. Normal mood.    Labs on Admission: I have personally reviewed following labs and imaging studies  CBC: Recent Labs  Lab 09/04/20 1839  WBC 3.8*  NEUTROABS 2.2  HGB 10.3*  HCT 32.9*  MCV 83.7  PLT 102   Basic Metabolic Panel: Recent  Labs  Lab 09/04/20 1839  NA 135  K 4.0  CL 105  CO2 25  GLUCOSE 91  BUN 9  CREATININE 0.91  CALCIUM 9.5   GFR: Estimated Creatinine Clearance: 112.5 mL/min (by C-G formula based on SCr of 0.91 mg/dL). Liver Function Tests: Recent Labs  Lab 09/04/20 1839  AST 20  ALT 18  ALKPHOS 66  BILITOT 0.5  PROT 6.9  ALBUMIN 3.3*   No results for input(s): LIPASE, AMYLASE in the last 168 hours. No results for input(s): AMMONIA in the last 168 hours. Coagulation Profile: Recent Labs  Lab 09/04/20 1839  INR 1.0   Cardiac Enzymes: No results for input(s): CKTOTAL, CKMB, CKMBINDEX, TROPONINI in the last 168 hours. BNP (last 3 results) No results for input(s): PROBNP in the last 8760 hours. HbA1C: No results for  input(s): HGBA1C in the last 72 hours. CBG: No results for input(s): GLUCAP in the last 168 hours. Lipid Profile: No results for input(s): CHOL, HDL, LDLCALC, TRIG, CHOLHDL, LDLDIRECT in the last 72 hours. Thyroid Function Tests: No results for input(s): TSH, T4TOTAL, FREET4, T3FREE, THYROIDAB in the last 72 hours. Anemia Panel: No results for input(s): VITAMINB12, FOLATE, FERRITIN, TIBC, IRON, RETICCTPCT in the last 72 hours. Urine analysis:    Component Value Date/Time   COLORURINE YELLOW 10/29/2012 0634   APPEARANCEUR CLOUDY (A) 10/29/2012 0634   LABSPEC 1.015 10/29/2012 0634   PHURINE 6.0 10/29/2012 0634   GLUCOSEU NEGATIVE 10/29/2012 0634   HGBUR NEGATIVE 10/29/2012 0634   BILIRUBINUR negative 04/19/2019 [place] 10/29/2012 0634   PROTEINUR Negative 04/19/2019 1156   PROTEINUR NEGATIVE 10/29/2012 0634   UROBILINOGEN 0.2 04/19/2019 1156   UROBILINOGEN 0.2 10/29/2012 0634   NITRITE negative 04/19/2019 1156   NITRITE NEGATIVE 10/29/2012 0634   LEUKOCYTESUR Negative 04/19/2019 1156    Radiological Exams on Admission: DG Chest 2 View  Result Date: 09/04/2020 CLINICAL DATA:  Chest pain.  Possible deep venous thrombosis. EXAM: CHEST - 2 VIEW  COMPARISON:  08/16/2019 FINDINGS: Heart size is normal. Mediastinal shadows are normal. Left lung is clear. There is perihilar opacity in the right lung suggesting pneumonia. No lobar consolidation or collapse. No effusion. No acute bone finding. IMPRESSION: Perihilar opacity of the right lung consistent with pneumonia. Electronically Signed   By: Nelson Chimes M.D.   On: 09/04/2020 19:20   CT Angio Chest PE W and/or Wo Contrast  Result Date: 09/04/2020 CLINICAL DATA:  Pain and swelling of the left calf, DVT noted on pelvic MRI EXAM: CT ANGIOGRAPHY CHEST WITH CONTRAST TECHNIQUE: Multidetector CT imaging of the chest was performed using the standard protocol during bolus administration of intravenous contrast. Multiplanar CT image reconstructions and MIPs were obtained to evaluate the vascular anatomy. CONTRAST:  6mL OMNIPAQUE IOHEXOL 350 MG/ML SOLN COMPARISON:  The pelvic MR or biliary air. CT angiography 07/18/2017 FINDINGS: Cardiovascular: Satisfactory opacification pulmonary arteries. No demonstrable right-sided pulmonary artery emboli to the segmental level with more distal evaluation limited by respiratory motion artifact there is evidence filling defect seen extending from the orifice of the lingular artery into the left inter lobar and left lower lobe are pulmonary artery as well as throughout the segmental branches of the left lower lobe. Central pulmonary arterial caliber is within normal limits. RV/LV ratio is top-normal at 0.88. Normal heart size. No pericardial effusion. The aortic root is suboptimally assessed given cardiac pulsation artifact. Suboptimal opacification of the thoracic aorta on this non tailored exam. No gross aortic abnormality. No periaortic stranding or hemorrhage is evident. Mediastinum/Nodes: No mediastinal fluid or gas. Normal thyroid gland and thoracic inlet. No acute abnormality of the trachea or esophagus. No worrisome mediastinal, hilar or axillary adenopathy. Lungs/Pleura:  Diffuse mild airways thickening. Patchy consolidation and branching tree-in-bud nodularity are seen in the right lower lobe. No other focal airspace opacity. Minimal atelectasis. No pneumothorax or visible effusion. No concerning pulmonary nodules or masses. Upper Abdomen: Scattered colonic diverticula without focal inflammation to suggest diverticulitis. No acute abnormalities present in the visualized portions of the upper abdomen. Musculoskeletal: No acute osseous abnormality or suspicious osseous lesion. Review of the MIP images confirms the above findings. IMPRESSION: Pulmonary artery emboli extending from the left interlobar pulmonary artery into the lobar and segmental branches of the left lower lobe. RV/LV ratio is top-normal at 0.88 without other CT features of right  heart strain. If there is persisting concern, echocardiogram could be obtained. No other significant pulmonary embolic burden to the segmental level. Airways thickening and scattered secretions with more patchy consolidative and tree-in-bud nodularity within the superior segment right lower lobe. Could reflect pneumonia, aspiration or other infectious/inflammatory process in the appropriate clinical setting. Infarct less favored in the absence of discernible emboli to this distribution. These results were called by telephone at the time of interpretation on 09/04/2020 at 10:54 pm to provider [facility] , who verbally acknowledged these results. Electronically Signed   By: Lovena Le M.D.   On: 09/04/2020 22:54   VAS [place] LOWER EXTREMITY VENOUS (DVT) (ONLY [facility] & [facility])  Result Date: 09/04/2020  Lower Venous DVT Study Patient Name:  Felicia Pham  Date of Exam:   09/04/2020 Medical Rec #: [id number]         Accession #:    [id number] Date of Birth: 1980-10-08         Patient Gender: F Patient Age:   60Y Exam Location:  [facility] Procedure:      VAS [place] LOWER EXTREMITY VENOUS (DVT) Referring Phys: [id number] [place]  --------------------------------------------------------------------------------  Indications: Pain.  Limitations: Body habitus and poor ultrasound/tissue interface. Comparison Study: Previous exam 09/07/15 - negative Performing Technologist: Rogelia Rohrer  Examination Guidelines: A complete evaluation includes B-mode imaging, spectral Doppler, color Doppler, and power Doppler as needed of all accessible portions of each vessel. Bilateral testing is considered an integral part of a complete examination. Limited examinations for reoccurring indications may be performed as noted. The reflux portion of the exam is performed with the patient in reverse Trendelenburg.  +-----+---------------+---------+-----------+----------+--------------+ RIGHTCompressibilityPhasicitySpontaneityPropertiesThrombus Aging +-----+---------------+---------+-----------+----------+--------------+ CFV  Full           Yes      Yes                                 +-----+---------------+---------+-----------+----------+--------------+   +---------+---------------+---------+-----------+----------+--------------+ LEFT     CompressibilityPhasicitySpontaneityPropertiesThrombus Aging +---------+---------------+---------+-----------+----------+--------------+ CFV      Full           Yes      Yes                                 +---------+---------------+---------+-----------+----------+--------------+ SFJ      Full                                                        +---------+---------------+---------+-----------+----------+--------------+ FV Prox  Full           Yes      Yes                                 +---------+---------------+---------+-----------+----------+--------------+ FV Mid   Full           Yes      Yes                                 +---------+---------------+---------+-----------+----------+--------------+ FV DistalFull           Yes  Yes                                  +---------+---------------+---------+-----------+----------+--------------+ PFV      Full                                                        +---------+---------------+---------+-----------+----------+--------------+ POP      Full           Yes      Yes                                 +---------+---------------+---------+-----------+----------+--------------+ PTV      Full                                                        +---------+---------------+---------+-----------+----------+--------------+ PERO     Full                                                        +---------+---------------+---------+-----------+----------+--------------+     Summary: RIGHT: - No evidence of common femoral vein obstruction.  LEFT: - There is no evidence of deep vein thrombosis in the lower extremity. - There is no evidence of superficial venous thrombosis.  - No cystic structure found in the popliteal fossa.  *See table(s) above for measurements and observations.    Preliminary       Assessment/Plan  Pulmonary embolism of the left interlobar pulmonary artery and segmental branches of left lower lobe Left internal iliac vein thrombosis -Likely provoked as patient travels hourly to work and recently was resumed on birth control -No findings of right heart strain on CTA and troponin was negative which is reassuring. No further imaging for now. -Continue IV heparin infusion - discontinued birth control  Questionable right lower lobe consolidation -Patient does not have leukocytosis and does not endorse any upper respiratory tract symptoms to suspect pneumonia -COVID 19 testing pending- keep on airborne and contact precaution until test results  Uterine fibroids -continue follow with OB/GYN - Will need to find alternative non-hormonal contraceptive given provoked PE   Anemia -likely from heavy menses -check iron panel, vitamin K09 and folic   Morbid obesity - BMI of  49  DVT prophylaxis:.IV heparin infusion Code Status: Full Family Communication: Plan discussed with patient at bedside  disposition Plan: Home with observation Consults called:  Admission status: Observation Level of care: Telemetry Cardiac  Status is: Observation  The patient remains OBS appropriate and will d/c before 2 midnights.  Dispo: The patient is from: Home              Anticipated d/c is to: Home              Patient currently is not medically stable to d/c.   Difficult to place patient No         Royal Piedra T  Taya Ashbaugh DO [facility]   If 7PM-7AM, please contact night-coverage [email or web address]   09/05/2020, 12:49 AM

## 2020-09-05 NOTE — Progress Notes (Signed)
ANTICOAGULATION CONSULT NOTE   Pharmacy Consult for Heparin Indication: pulmonary embolus, possible LLE DVT  No Known Allergies  Patient Measurements: Height: 5\' 4"  (162.6 cm) Weight: 130.5 kg (287 lb 11.2 oz) IBW/kg (Calculated) : 54.7 Heparin Dosing Weight: 86 kg  Vital Signs: BP: 132/85 (05/25 0445) Pulse Rate: 79 (05/25 0445)  Labs: Recent Labs    09/04/20 1839 09/04/20 2001 09/05/20 0535 09/05/20 0536  HGB 10.3*  --   --  10.2*  HCT 32.9*  --   --  32.7*  PLT 323  --   --  291  LABPROT 13.2  --   --   --   INR 1.0  --   --   --   HEPARINUNFRC  --   --  0.25*  --   CREATININE 0.91  --   --   --   TROPONINIHS <2 <2  --   --     Estimated Creatinine Clearance: 112.5 mL/min (by C-G formula based on SCr of 0.91 mg/dL).   Medical History: Past Medical History:  Diagnosis Date  . Rib fracture   . Uterine fibroid     Medications:  Awaiting home med rec  Assessment: 40 y.o. F presents with LLE DVT seen on MRI at another facility. Preliminary LLE venous duplex here shows no DVT.  CT shows pulmonary artery emboli extending from the left interlobar pulmonary artery into the lobar and segmental branches of the left lower lobe. RV/LV ratio is top-normal at 0.88 without other CT features of right heart strain. To begin heparin per pharmacy. No AC PTA. Hgb 10.3 on admission, plt wnl.  Heparin level subtherapeutic (0.25) on gtt at 1500 units/hr. No issues with line or bleeding reported per RN.  Goal of Therapy:  Heparin level 0.3-0.7 units/ml Monitor platelets by anticoagulation protocol: Yes   Plan:  Rebolus heparin IV 1500 units Increase heparin gtt to 1700 units/hr Will f/u heparin level in 6 hours  Sherlon Handing, PharmD, BCPS Please see amion for complete clinical pharmacist phone list 09/05/2020,6:01 AM

## 2020-09-05 NOTE — ED Notes (Addendum)
pt ambulated with pulse ox. pt's pulse ox remained 100%.

## 2020-09-05 NOTE — ED Notes (Signed)
Pt requesting to hold off blood draw until after rest

## 2020-09-05 NOTE — Progress Notes (Signed)
PROGRESS NOTE    Yana Schorr Ainsley  XLK:[id number] DOB: 1980/05/08 DOA: 09/04/2020 PCP: Janie Morning, DO     Brief Narrative:  AMISADAI WOODFORD is a 40 y.o. BF PMHx uterine leiomyoma with fibroid, HTN, HLD.,   Presents after a left internal iliac vein thrombus was seen on MRI.  Patient was following with OB/GYN to evaluate her uterine fibroid and recent pelvic pain.  She was sent for an MRI of the pelvis and it resulted with a probable thrombus within the left internal iliac vein that was age-indeterminate and she was sent to the ED for further evaluation.  Patient is on a hormonal birth control.  States she started it back in January of this year.  She had been off of birth control for 2 years.  States about 4 days ago she began to note left calf pain and swelling.  Then the following day she traveled about 3-1/2 hours.  She also travels an hour for work each day.  Denies any shortness of breath.  Denies any previous history of DVT or PE.  Denies any family of hypercoagulable disorder. She denies any tobacco use.  No history of migraines.  ED Course: She was afebrile and normotensive on room air.  CTA of the chest was obtained and showed pulmonary emboli of the left interlobar pulmonary artery into the segmental branches of the left lower lobe without findings of heart strain.  Troponin also negative.  There is also questionable right-sided patchy consolidation.  COVID-19 test pending.   Subjective: Afebrile overnight, negative family history of DVT/PE.  Negative history of long travels.   Assessment & Plan: Covid vaccination;   Principal Problem:   Pulmonary embolism ([place]) Active Problems:   Leiomyoma of uterus, unspecified   Anemia   Obesity, Class III, BMI 40-49.9 (morbid obesity) (HCC)   Consolidation of right lower lobe of lung (HCC)  Acute PE left interlobar pulmonary artery and segmental branches of left lower lobeLeft internal iliac vein thrombosis -Likely provoked as  patient travels hourly to work and recently was resumed on birth control -No findings of right heart strain on CTA and troponin was negative which is reassuring.  -Echocardiogram pending -Continue IV heparin infusion - discontinued birth control -Counseled patient at length that she would need to remain on DOAC for 3 to 6 months at which time she could discuss with her PCP as to whether they wanted to run a hypercoagulability panel on her.  Questionable right lower lobe consolidation -Negative fever, negative leukocytosis, negative SOB.  Most likely aspiration. -Would hold on starting antibiotics at this time unless patient begins to exhibit symptoms of pneumonia.    Uterine fibroids -continue follow with OB/GYN - Will need to find alternative non-hormonal contraceptive given provoked PE   Anemia -likely from heavy menses -check iron panel, vitamin D66 and folic   Morbid obesity (BMI 49.38 kg/m) - BMI of 49         DVT prophylaxis: Heparin per pharmacy Code Status: Full Family Communication:  Status is: Inpatient    Dispo: The patient is from: Home              Anticipated d/c is to: Home              Anticipated d/c date is: 5/26              Patient currently unstable      Consultants:    Procedures/Significant Events:  5/24 [place] bilateral lower extremity: Negative for  DVT 5/24 CTA chest PE protocol:Pulmonary artery emboli extending from the left interlobar pulmonary artery into the lobar and segmental branches of the left lower lobe. RV/LV ratio is top-normal at 0.88 without other CT features of right heart strain. If there is persisting concern, echocardiogram could be obtained.  No other significant pulmonary embolic burden to the segmental level.  Airways thickening and scattered secretions with more patchy consolidative and tree-in-bud nodularity within the superior segment right lower lobe. Could reflect pneumonia, aspiration or  other infectious/inflammatory process in the appropriate clinical setting. Infarct less favored in the absence of discernible emboli to this distribution.  I have personally reviewed and interpreted all radiology studies and my findings are as above.  VENTILATOR SETTINGS:    Cultures 5/24 SARS coronavirus negative  Antimicrobials:    Devices    LINES / TUBES:      Continuous Infusions: . heparin 1,700 Units/hr (09/05/20 0607)     Objective: Vitals:   09/05/20 0430 09/05/20 0445 09/05/20 0630 09/05/20 0645  BP: 129/86 132/85 113/78 106/66  Pulse: 80 79 82 93  Resp: 18 17 17 17   Temp:      TempSrc:      SpO2: 100% 100% 100% 100%  Weight:      Height:       No intake or output data in the 24 hours ending 09/05/20 0753 Filed Weights   09/04/20 2300  Weight: 130.5 kg    Examination:  General: A/O x4, No acute respiratory distress Eyes: negative scleral hemorrhage, negative anisocoria, negative icterus ENT: Positive runny nose, negative gingival bleeding, positive congestion. Neck:  Negative scars, masses, torticollis, lymphadenopathy, JVD Lungs: Clear to auscultation bilaterally without wheezes or crackles Cardiovascular: Regular rate and rhythm without murmur gallop or rub normal S1 and S2 Abdomen: MORBIDLY OBESE, negative abdominal pain, nondistended, positive soft, bowel sounds, no rebound, no ascites, no appreciable mass Extremities: No significant cyanosis, clubbing, or edema bilateral lower extremities Skin: Negative rashes, lesions, ulcers Psychiatric:  Negative depression, negative anxiety, negative fatigue, negative mania  Central nervous system:  Cranial nerves II through XII intact, tongue/uvula midline, all extremities muscle strength 5/5, sensation intact throughout,  negative dysarthria, negative expressive aphasia, negative receptive aphasia.  .     Data Reviewed: Care during the described time interval was provided by me .  I have reviewed  this patient's available data, including medical history, events of note, physical examination, and all test results as part of my evaluation.  CBC: Recent Labs  Lab 09/04/20 1839 09/05/20 0536  WBC 3.8* 4.5  NEUTROABS 2.2  --   HGB 10.3* 10.2*  HCT 32.9* 32.7*  MCV 83.7 83.6  PLT 323 790   Basic Metabolic Panel: Recent Labs  Lab 09/04/20 1839 09/05/20 0536  NA 135 135  K 4.0 3.9  CL 105 103  CO2 25 22  GLUCOSE 91 96  BUN 9 7  CREATININE 0.91 0.91  CALCIUM 9.5 9.3   GFR: Estimated Creatinine Clearance: 112.5 mL/min (by C-G formula based on SCr of 0.91 mg/dL). Liver Function Tests: Recent Labs  Lab 09/04/20 1839  AST 20  ALT 18  ALKPHOS 66  BILITOT 0.5  PROT 6.9  ALBUMIN 3.3*   No results for input(s): LIPASE, AMYLASE in the last 168 hours. No results for input(s): AMMONIA in the last 168 hours. Coagulation Profile: Recent Labs  Lab 09/04/20 1839  INR 1.0   Cardiac Enzymes: No results for input(s): CKTOTAL, CKMB, CKMBINDEX, TROPONINI in the last 168 hours.  BNP (last 3 results) No results for input(s): PROBNP in the last 8760 hours. HbA1C: No results for input(s): HGBA1C in the last 72 hours. CBG: No results for input(s): GLUCAP in the last 168 hours. Lipid Profile: No results for input(s): CHOL, HDL, LDLCALC, TRIG, CHOLHDL, LDLDIRECT in the last 72 hours. Thyroid Function Tests: No results for input(s): TSH, T4TOTAL, FREET4, T3FREE, THYROIDAB in the last 72 hours. Anemia Panel: Recent Labs    09/05/20 0535  VITAMINB12 250  FOLATE 30.9  TIBC 434  IRON 26*   Sepsis Labs: No results for input(s): PROCALCITON, LATICACIDVEN in the last 168 hours.  Recent Results (from the past 240 hour(s))  SARS CORONAVIRUS 2 (TAT 6-24 HRS) Nasopharyngeal Nasopharyngeal Swab     Status: None   Collection Time: 09/04/20  9:31 PM   Specimen: Nasopharyngeal Swab  Result Value Ref Range Status   SARS Coronavirus 2 NEGATIVE NEGATIVE Final    Comment:  (NOTE) SARS-CoV-2 target nucleic acids are NOT DETECTED.  The SARS-CoV-2 RNA is generally detectable in upper and lower respiratory specimens during the acute phase of infection. Negative results do not preclude SARS-CoV-2 infection, do not rule out co-infections with other pathogens, and should not be used as the sole basis for treatment or other patient management decisions. Negative results must be combined with clinical observations, patient history, and epidemiological information. The expected result is Negative.  Fact Sheet for Patients: SugarRoll.be  Fact Sheet for Healthcare Providers: https://www.Danniel Tones-mathews.com/  This test is not yet approved or cleared by the [place] FDA and  has been authorized for detection and/or diagnosis of SARS-CoV-2 by FDA under an Emergency Use Authorization (EUA). This EUA will remain  in effect (meaning this test can be used) for the duration of the COVID-19 declaration under Se ction 564(b)(1) of the Act, 21 U.S.C. section 360bbb-3(b)(1), unless the authorization is terminated or revoked sooner.  Performed at [facility], [place] [address], [place], [place] [address]          Radiology Studies: DG Chest 2 View  Result Date: 09/04/2020 CLINICAL DATA:  Chest pain.  Possible deep venous thrombosis. EXAM: CHEST - 2 VIEW COMPARISON:  08/16/2019 FINDINGS: Heart size is normal. Mediastinal shadows are normal. Left lung is clear. There is perihilar opacity in the right lung suggesting pneumonia. No lobar consolidation or collapse. No effusion. No acute bone finding. IMPRESSION: Perihilar opacity of the right lung consistent with pneumonia. Electronically Signed   By: Nelson Chimes M.D.   On: 09/04/2020 19:20   CT Angio Chest PE W and/or Wo Contrast  Result Date: 09/04/2020 CLINICAL DATA:  Pain and swelling of the left calf, DVT noted on pelvic MRI EXAM: CT ANGIOGRAPHY CHEST WITH CONTRAST  TECHNIQUE: Multidetector CT imaging of the chest was performed using the standard protocol during bolus administration of intravenous contrast. Multiplanar CT image reconstructions and MIPs were obtained to evaluate the vascular anatomy. CONTRAST:  10mL OMNIPAQUE IOHEXOL 350 MG/ML SOLN COMPARISON:  The pelvic MR or biliary air. CT angiography 07/18/2017 FINDINGS: Cardiovascular: Satisfactory opacification pulmonary arteries. No demonstrable right-sided pulmonary artery emboli to the segmental level with more distal evaluation limited by respiratory motion artifact there is evidence filling defect seen extending from the orifice of the lingular artery into the left inter lobar and left lower lobe are pulmonary artery as well as throughout the segmental branches of the left lower lobe. Central pulmonary arterial caliber is within normal limits. RV/LV ratio is top-normal at 0.88. Normal heart size. No  pericardial effusion. The aortic root is suboptimally assessed given cardiac pulsation artifact. Suboptimal opacification of the thoracic aorta on this non tailored exam. No gross aortic abnormality. No periaortic stranding or hemorrhage is evident. Mediastinum/Nodes: No mediastinal fluid or gas. Normal thyroid gland and thoracic inlet. No acute abnormality of the trachea or esophagus. No worrisome mediastinal, hilar or axillary adenopathy. Lungs/Pleura: Diffuse mild airways thickening. Patchy consolidation and branching tree-in-bud nodularity are seen in the right lower lobe. No other focal airspace opacity. Minimal atelectasis. No pneumothorax or visible effusion. No concerning pulmonary nodules or masses. Upper Abdomen: Scattered colonic diverticula without focal inflammation to suggest diverticulitis. No acute abnormalities present in the visualized portions of the upper abdomen. Musculoskeletal: No acute osseous abnormality or suspicious osseous lesion. Review of the MIP images confirms the above findings.  IMPRESSION: Pulmonary artery emboli extending from the left interlobar pulmonary artery into the lobar and segmental branches of the left lower lobe. RV/LV ratio is top-normal at 0.88 without other CT features of right heart strain. If there is persisting concern, echocardiogram could be obtained. No other significant pulmonary embolic burden to the segmental level. Airways thickening and scattered secretions with more patchy consolidative and tree-in-bud nodularity within the superior segment right lower lobe. Could reflect pneumonia, aspiration or other infectious/inflammatory process in the appropriate clinical setting. Infarct less favored in the absence of discernible emboli to this distribution. These results were called by telephone at the time of interpretation on 09/04/2020 at 10:54 pm to provider [facility] , who verbally acknowledged these results. Electronically Signed   By: Lovena Le M.D.   On: 09/04/2020 22:54   VAS [place] LOWER EXTREMITY VENOUS (DVT) (ONLY [facility] & [facility])  Result Date: 09/05/2020  Lower Venous DVT Study Patient Name:  JACARA BENITO  Date of Exam:   09/04/2020 Medical Rec #: [id number]         Accession #:    [id number] Date of Birth: [birth year]         Patient Gender: F Patient Age:   21Y Exam Location:  [facility] Procedure:      VAS [place] LOWER EXTREMITY VENOUS (DVT) Referring Phys: [id number] [place] --------------------------------------------------------------------------------  Indications: Pain.  Limitations: Body habitus and poor ultrasound/tissue interface. Comparison Study: Previous exam 09/07/15 - negative Performing Technologist: Rogelia Rohrer  Examination Guidelines: A complete evaluation includes B-mode imaging, spectral Doppler, color Doppler, and power Doppler as needed of all accessible portions of each vessel. Bilateral testing is considered an integral part of a complete examination. Limited examinations for reoccurring indications may be performed as noted. The  reflux portion of the exam is performed with the patient in reverse Trendelenburg.  +-----+---------------+---------+-----------+----------+--------------+ RIGHTCompressibilityPhasicitySpontaneityPropertiesThrombus Aging +-----+---------------+---------+-----------+----------+--------------+ CFV  Full           Yes      Yes                                 +-----+---------------+---------+-----------+----------+--------------+   +---------+---------------+---------+-----------+----------+--------------+ LEFT     CompressibilityPhasicitySpontaneityPropertiesThrombus Aging +---------+---------------+---------+-----------+----------+--------------+ CFV      Full           Yes      Yes                                 +---------+---------------+---------+-----------+----------+--------------+ SFJ      Full                                                        +---------+---------------+---------+-----------+----------+--------------+  FV Prox  Full           Yes      Yes                                 +---------+---------------+---------+-----------+----------+--------------+ FV Mid   Full           Yes      Yes                                 +---------+---------------+---------+-----------+----------+--------------+ FV DistalFull           Yes      Yes                                 +---------+---------------+---------+-----------+----------+--------------+ PFV      Full                                                        +---------+---------------+---------+-----------+----------+--------------+ POP      Full           Yes      Yes                                 +---------+---------------+---------+-----------+----------+--------------+ PTV      Full                                                        +---------+---------------+---------+-----------+----------+--------------+ PERO     Full                                                         +---------+---------------+---------+-----------+----------+--------------+     Summary: RIGHT: - No evidence of common femoral vein obstruction.  LEFT: - There is no evidence of deep vein thrombosis in the lower extremity. - There is no evidence of superficial venous thrombosis.  - No cystic structure found in the popliteal fossa.  *See table(s) above for measurements and observations. Electronically signed by Deitra Mayo MD on 09/05/2020 at 6:53:13 AM.    Final         Scheduled Meds: Continuous Infusions: . heparin 1,700 Units/hr (09/05/20 0607)     LOS: 0 days    Time spent:40 min    Feather Berrie, Geraldo Docker, MD [facility]   If 7PM-7AM, please contact night-coverage 09/05/2020, 7:53 AM

## 2020-09-05 NOTE — Progress Notes (Signed)
*  PRELIMINARY RESULTS* Echocardiogram 2D Echocardiogram has been performed.  Luisa Hart RDCS 09/05/2020, 12:33 PM

## 2020-09-06 ENCOUNTER — Other Ambulatory Visit (HOSPITAL_COMMUNITY): Payer: Self-pay

## 2020-09-06 DIAGNOSIS — R0602 Shortness of breath: Secondary | ICD-10-CM

## 2020-09-06 DIAGNOSIS — J181 Lobar pneumonia, unspecified organism: Secondary | ICD-10-CM | POA: Diagnosis not present

## 2020-09-06 DIAGNOSIS — D649 Anemia, unspecified: Secondary | ICD-10-CM | POA: Diagnosis not present

## 2020-09-06 DIAGNOSIS — J189 Pneumonia, unspecified organism: Secondary | ICD-10-CM | POA: Diagnosis not present

## 2020-09-06 LAB — COMPREHENSIVE METABOLIC PANEL
ALT: 16 U/L (ref 0–44)
AST: 17 U/L (ref 15–41)
Albumin: 3.1 g/dL — ABNORMAL LOW (ref 3.5–5.0)
Alkaline Phosphatase: 63 U/L (ref 38–126)
Anion gap: 9 (ref 5–15)
BUN: 8 mg/dL (ref 6–20)
CO2: 21 mmol/L — ABNORMAL LOW (ref 22–32)
Calcium: 9 mg/dL (ref 8.9–10.3)
Chloride: 106 mmol/L (ref 98–111)
Creatinine, Ser: 0.91 mg/dL (ref 0.44–1.00)
GFR, Estimated: >60 mL/min (ref >60)
Glucose, Bld: 104 mg/dL — ABNORMAL HIGH (ref 70–99)
Potassium: 3.6 mmol/L (ref 3.5–5.1)
Sodium: 136 mmol/L (ref 135–145)
Total Bilirubin: 0.6 mg/dL (ref 0.3–1.2)
Total Protein: 7.1 g/dL (ref 6.5–8.1)

## 2020-09-06 LAB — PHOSPHORUS: Phosphorus: 3.9 mg/dL (ref 2.5–4.6)

## 2020-09-06 LAB — CBC
HCT: 31.7 % — ABNORMAL LOW (ref 36.0–46.0)
Hemoglobin: 10.2 g/dL — ABNORMAL LOW (ref 12.0–15.0)
MCH: 26.2 pg (ref 26.0–34.0)
MCHC: 32.2 g/dL (ref 30.0–36.0)
MCV: 81.3 fL (ref 80.0–100.0)
Platelets: 314 10*3/uL (ref 150–400)
RBC: 3.9 MIL/uL (ref 3.87–5.11)
RDW: 14.7 % (ref 11.5–15.5)
WBC: 4.5 10*3/uL (ref 4.0–10.5)
nRBC: 0 % (ref 0.0–0.2)

## 2020-09-06 LAB — MAGNESIUM: Magnesium: 1.7 mg/dL (ref 1.7–2.4)

## 2020-09-06 LAB — HEPARIN LEVEL (UNFRACTIONATED): Heparin Unfractionated: 0.47 IU/mL (ref 0.30–0.70)

## 2020-09-06 MED ORDER — RIVAROXABAN (XARELTO) EDUCATION KIT FOR DVT/PE PATIENTS
PACK | Freq: Once | Status: AC
  Filled 2020-09-06: qty 1

## 2020-09-06 MED ORDER — RIVAROXABAN 20 MG PO TABS: 20.0000 mg | ORAL_TABLET | Freq: Every day | ORAL | Status: DC

## 2020-09-06 MED ORDER — ACETAMINOPHEN 325 MG PO TABS
650.0000 mg | ORAL_TABLET | Freq: Four times a day (QID) | ORAL | 0 refills | Status: AC | PRN
  Filled 2020-09-06: qty 60, 8d supply, fill #0

## 2020-09-06 MED ORDER — RIVAROXABAN 15 MG PO TABS: 15.0000 mg | ORAL_TABLET | Freq: Two times a day (BID) | ORAL | Status: DC

## 2020-09-06 MED ORDER — RIVAROXABAN 15 MG PO TABS
15.0000 mg | ORAL_TABLET | Freq: Two times a day (BID) | ORAL | Status: DC
  Administered 2020-09-06: 15 mg via ORAL
  Filled 2020-09-06: qty 1

## 2020-09-06 MED ORDER — RIVAROXABAN (XARELTO) VTE STARTER PACK (15 & 20 MG)
ORAL_TABLET | ORAL | 0 refills | Status: DC
  Filled 2020-09-06: qty 51, 30d supply, fill #0

## 2020-09-06 MED ORDER — RIVAROXABAN (XARELTO) EDUCATION KIT FOR DVT/PE PATIENTS
PACK | 0 refills | Status: DC
  Filled 2020-09-06: qty 1, fill #0

## 2020-09-06 MED ORDER — RIVAROXABAN 20 MG PO TABS
20.0000 mg | ORAL_TABLET | Freq: Every day | ORAL | 0 refills | Status: DC
  Filled 2020-09-06: qty 30, 30d supply, fill #0

## 2020-09-06 NOTE — TOC Benefit Eligibility Note (Signed)
Transition of Care [facility]) Benefit Eligibility Note    Patient Details  Name: Felicia Pham MRN: [id number] Date of Birth: 03-Dec-1980   Medication/Dose: Ronney Asters MG DAILY  Covered?: Yes  Tier: 2 Drug  Prescription Coverage Preferred Pharmacy: [facility]  Spoke with Person/Company/Phone Number:: CAROL  @ [facility] RX #  [phone] OPT- MEMBER  Co-Pay: $30.00  Prior Approval: No          Memory Argue Phone Number: 09/06/2020, 11:22 AM

## 2020-09-06 NOTE — Care Management (Signed)
Isabel will fill 30 day prescription for Xarelto using 30 day free card.   NCM entered benefit check to see if preauthorization needed.   NCM provided $10 co pay card.   Magdalen Spatz RN

## 2020-09-06 NOTE — Progress Notes (Signed)
ANTICOAGULATION CONSULT NOTE   Pharmacy Consult for Heparin Indication: pulmonary embolus, possible LLE DVT  No Known Allergies  Patient Measurements: Height: 5\' 4"  (162.6 cm) Weight: 130.5 kg (287 lb 11.2 oz) IBW/kg (Calculated) : 54.7 Heparin Dosing Weight: 86 kg  Vital Signs: Temp: 98.8 F (37.1 C) (05/26 0620) Temp Source: Oral (05/26 0620) BP: 132/79 (05/26 0620) Pulse Rate: 89 (05/26 0620)  Labs: Recent Labs    09/04/20 1839 09/04/20 2001 09/05/20 0535 09/05/20 0536 09/05/20 1430 09/06/20 0526  HGB 10.3*  --   --  10.2*  --  10.2*  HCT 32.9*  --   --  32.7*  --  31.7*  PLT 323  --   --  291  --  314  LABPROT 13.2  --   --   --   --   --   INR 1.0  --   --   --   --   --   HEPARINUNFRC  --   --  0.25*  --  0.39 0.47  CREATININE 0.91  --   --  0.91  --  0.91  TROPONINIHS <2 <2  --   --   --   --     Estimated Creatinine Clearance: 112.5 mL/min (by C-G formula based on SCr of 0.91 mg/dL).   Medical History: Past Medical History:  Diagnosis Date  . Rib fracture   . Uterine fibroid     Medications:  Awaiting home med rec  Assessment: 40 y.o. F presents with LLE DVT seen on MRI at another facility. Preliminary LLE venous duplex here shows no DVT.  CT shows pulmonary artery emboli extending from the left interlobar pulmonary artery into the lobar and segmental branches of the left lower lobe. RV/LV ratio is top-normal at 0.88 without other CT features of right heart strain. Pharmacy consulted to dose heparin. No AC PTA.   Heparin level 0.47 is therapeutic on heparin 1700 units/hr. Hgb 10.2. Plt wnl. No noted bleeding.   Goal of Therapy:  Heparin level 0.3-0.7 units/ml Monitor platelets by anticoagulation protocol: Yes   Plan:  Continue heparin gtt at 1700 units/hr Monitor CBC, heparin level, and for s/sx of bleeding  Follow up oral anticoagulant plans   Cristela Felt, PharmD Clinical Pharmacist  09/06/2020 7:27 AM

## 2020-09-06 NOTE — Discharge Summary (Signed)
Physician Discharge Summary  Felicia Pham VOH:[id number] DOB: 19-Apr-1980 DOA: 09/04/2020  PCP: Janie Morning, DO  Admit date: 09/04/2020 Discharge date: 09/06/2020  Time spent: 35 minutes  Recommendations for Outpatient Follow-up:   Acute PE left interlobar pulmonary artery and segmental branches of left lower lobeLeft internal iliac vein thrombosis -Likely provoked as patient travels hourly to work and recently was resumed on birth control -No findings of right heart strain on CTA and troponin was negative which is reassuring. -Echocardiogram WNL, see results below - discontinued birth control -Counseled patient at length that she would need to remain on DOAC for 3 to 6 months at which time she could discuss with her PCP as to whether they wanted to run a hypercoagulability panel on her. - 5/26 start patient on Xarelto, educate and discharge  Questionable right lower lobe consolidation -Negative fever, negative leukocytosis, negative SOB.  Most likely aspiration. -Would hold on starting antibiotics at this time unless patient begins to exhibit symptoms of pneumonia.    Uterine fibroids -continue follow with OB/GYN - Will need to find alternative non-hormonal contraceptive given provoked PE   Anemia -likely from heavy menses -check iron panel, vitamin I94 and folic  Morbid obesity (BMI 49.38 kg/m) -Would benefit from nutritional consult per PCP     Discharge Diagnoses:  Principal Problem:   Pulmonary embolism ([place]) Active Problems:   Leiomyoma of uterus, unspecified   Anemia   Obesity, Class III, BMI 40-49.9 (morbid obesity) ([place])   Consolidation of right lower lobe of lung ([place])   Discharge Condition: Stable  Diet recommendation: Heart healthy  Filed Weights   09/04/20 2300  Weight: 130.5 kg    History of present illness:  Felicia Pham a 40 y.o.BF PMHx uterine leiomyoma with fibroid, HTN, HLD.,   Presents after a left internal iliac vein  thrombus was seen on MRI.  Patient was following with OB/GYN to evaluate her uterine fibroidand recent pelvic pain. She was sent for an MRI of the pelvis and it resulted with a probable thrombus within the left internal iliac vein that was age-indeterminate and she was sent to the ED for further evaluation. Patient is on a hormonal birth control. States she started it back in January of this year. She had been off of birth control for 2 years. States about 4 days ago she began to note left calf pain and swelling. Then the following day she traveled about 3-1/2 hours. She also travels an hour for work each day. Denies any shortness of breath. Denies any previous history of DVT or PE. Denies any family of hypercoagulable disorder. She denies any tobacco use. No history of migraines.  ED Course:She was afebrile and normotensive on room air. CTA of the chest was obtained and showed pulmonary emboli of the left interlobar pulmonary artery into the segmental branches of the left lower lobe without findings of heart strain. Troponin also negative. There is also questionable right-sided patchy consolidation. COVID-19 test pending.  Hospital Course:  See above  Procedures: 5/24 [place] bilateral lower extremity: Negative for DVT 5/24 CTA chest PE protocol:Pulmonary artery emboli extending from the left interlobar pulmonary artery into the lobar and segmental branches of the left lower lobe. RV/LV ratio is top-normal at 0.88 without other CT features of right heart strain. If there is persisting concern, echocardiogram could be obtained.  No other significant pulmonary embolic burden to the segmental level. Airways thickening and scattered secretions with more patchy consolidative and tree-in-bud nodularity within the superior segment  right lower lobe. Could reflect pneumonia, aspiration or other infectious/inflammatory process in the appropriate clinical setting. Infarct less favored  in the absence of discernible emboli to this distribution. 5/25 echocardiogram:Left Ventricle: LVEF= 60 to 65%.     Cultures  5/24 SARS coronavirus negative     Discharge Exam: Vitals:   09/05/20 1545 09/05/20 2003 09/05/20 2359 09/06/20 0620  BP: (!) 140/100 125/87 133/88 132/79  Pulse: 85 65 72 89  Resp: _0 Temp: 98.6 F (37 C) 98.5 F (36.9 C) 98.5 F (36.9 C) 98.8 F (37.1 C)  TempSrc: Oral Oral Oral Oral  SpO2: 100% 100% 100% 97%  Weight:      Height:        General: A/O x4, No acute respiratory distress Eyes: negative scleral hemorrhage, negative anisocoria, negative icterus ENT: Positive runny nose, negative gingival bleeding, positive congestion. Neck:  Negative scars, masses, torticollis, lymphadenopathy, JVD Lungs: Clear to auscultation bilaterally without wheezes or crackles Cardiovascular: Regular rate and rhythm without murmur gallop or rub normal S1 and S2  Discharge Instructions   Allergies as of 09/06/2020   No Known Allergies     Medication List    STOP taking these medications   cyclobenzaprine 10 MG tablet Commonly known as: FLEXERIL   drospirenone-ethinyl estradiol 3-0.02 MG tablet Commonly known as: YAZ   famotidine 20 MG tablet Commonly known as: PEPCID   lidocaine 5 % Commonly known as: Lidoderm   metroNIDAZOLE 500 MG tablet Commonly known as: FLAGYL   spironolactone 25 MG tablet Commonly known as: ALDACTONE     TAKE these medications   acetaminophen 325 MG tablet Commonly known as: TYLENOL Take 2 tablets (650 mg total) by mouth every 6 (six) hours as needed for headache or mild pain.   cetirizine 10 MG tablet Commonly known as: ZyrTEC Allergy Take 1 tablet (10 mg total) by mouth daily.   fluticasone 0.05 % cream Commonly known as: CUTIVATE Apply 1 application topically in the morning and at bedtime.   hydrocortisone 2.5 % cream Apply 1 application topically daily as needed (rash, dry skin).   ibuprofen  200 MG tablet Commonly known as: ADVIL Take 600-800 mg by mouth every 6 (six) hours as needed for moderate pain. Reported on 09/25/2015   ketoconazole 2 % cream Commonly known as: NIZORAL Apply 1 application topically 2 (two) times daily.   ketoconazole 2 % shampoo Commonly known as: NIZORAL Apply 1 application topically every 14 (fourteen) days.   Rivaroxaban 15 MG Tabs tablet Commonly known as: XARELTO Take 1 tablet (15 mg total) by mouth 2 (two) times daily with a meal.   rivaroxaban 20 MG Tabs tablet Commonly known as: XARELTO Take 1 tablet (20 mg total) by mouth daily with supper. Start taking on: September 27, 2020   rivaroxaban Kit Commonly known as: [employer] for [facility]      No Known Allergies    The results of significant diagnostics from this hospitalization (including imaging, microbiology, ancillary and laboratory) are listed below for reference.    Significant Diagnostic Studies: DG Chest 2 View  Result Date: 09/04/2020 CLINICAL DATA:  Chest pain.  Possible deep venous thrombosis. EXAM: CHEST - 2 VIEW COMPARISON:  08/16/2019 FINDINGS: Heart size is normal. Mediastinal shadows are normal. Left lung is clear. There is perihilar opacity in the right lung suggesting pneumonia. No lobar consolidation or collapse. No effusion. No acute bone finding. IMPRESSION: Perihilar opacity of the right lung consistent with pneumonia. Electronically Signed  By: Nelson Chimes M.D.   On: 09/04/2020 19:20   CT Angio Chest PE W and/or Wo Contrast  Result Date: 09/04/2020 CLINICAL DATA:  Pain and swelling of the left calf, DVT noted on pelvic MRI EXAM: CT ANGIOGRAPHY CHEST WITH CONTRAST TECHNIQUE: Multidetector CT imaging of the chest was performed using the standard protocol during bolus administration of intravenous contrast. Multiplanar CT image reconstructions and MIPs were obtained to evaluate the vascular anatomy. CONTRAST:  56m OMNIPAQUE IOHEXOL 350 MG/ML SOLN  COMPARISON:  The pelvic MR or biliary air. CT angiography 07/18/2017 FINDINGS: Cardiovascular: Satisfactory opacification pulmonary arteries. No demonstrable right-sided pulmonary artery emboli to the segmental level with more distal evaluation limited by respiratory motion artifact there is evidence filling defect seen extending from the orifice of the lingular artery into the left inter lobar and left lower lobe are pulmonary artery as well as throughout the segmental branches of the left lower lobe. Central pulmonary arterial caliber is within normal limits. RV/LV ratio is top-normal at 0.88. Normal heart size. No pericardial effusion. The aortic root is suboptimally assessed given cardiac pulsation artifact. Suboptimal opacification of the thoracic aorta on this non tailored exam. No gross aortic abnormality. No periaortic stranding or hemorrhage is evident. Mediastinum/Nodes: No mediastinal fluid or gas. Normal thyroid gland and thoracic inlet. No acute abnormality of the trachea or esophagus. No worrisome mediastinal, hilar or axillary adenopathy. Lungs/Pleura: Diffuse mild airways thickening. Patchy consolidation and branching tree-in-bud nodularity are seen in the right lower lobe. No other focal airspace opacity. Minimal atelectasis. No pneumothorax or visible effusion. No concerning pulmonary nodules or masses. Upper Abdomen: Scattered colonic diverticula without focal inflammation to suggest diverticulitis. No acute abnormalities present in the visualized portions of the upper abdomen. Musculoskeletal: No acute osseous abnormality or suspicious osseous lesion. Review of the MIP images confirms the above findings. IMPRESSION: Pulmonary artery emboli extending from the left interlobar pulmonary artery into the lobar and segmental branches of the left lower lobe. RV/LV ratio is top-normal at 0.88 without other CT features of right heart strain. If there is persisting concern, echocardiogram could be  obtained. No other significant pulmonary embolic burden to the segmental level. Airways thickening and scattered secretions with more patchy consolidative and tree-in-bud nodularity within the superior segment right lower lobe. Could reflect pneumonia, aspiration or other infectious/inflammatory process in the appropriate clinical setting. Infarct less favored in the absence of discernible emboli to this distribution. These results were called by telephone at the time of interpretation on 09/04/2020 at 10:54 pm to [facility] who verbally acknowledged these results. Electronically Signed   By: PLovena LeM.D.   On: 09/04/2020 22:54   ECHOCARDIOGRAM COMPLETE  Result Date: 09/05/2020    ECHOCARDIOGRAM REPORT   Patient Name:   TBRANDAN GLAUBERDate of Exam: 09/05/2020 Medical Rec #:  [id number]       Height:       64.0 in Accession #:    [id number]      Weight:       287.7 lb Date of Birth:  9March 14, 1982       BSA:          2.283 m Patient Age:    39 years         BP:           130/88 mmHg Patient Gender: F                HR:  82 bpm. Exam Location:  Inpatient Procedure: 2D Echo, Cardiac Doppler and Color Doppler Indications:    Pulmonary embolus  History:        Patient has no prior history of Echocardiogram examinations.  Sonographer:    Luisa Hart RDCS Referring Phys: [id number] [place]  1. Left ventricular ejection fraction, by estimation, is 60 to 65%. The left ventricle has normal function. The left ventricle has no regional wall motion abnormalities. Left ventricular diastolic parameters were normal.  2. Right ventricular systolic function is normal. The right ventricular size is normal. There is normal pulmonary artery systolic pressure.  3. The mitral valve is normal in structure. No evidence of mitral valve regurgitation. No evidence of mitral stenosis.  4. The aortic valve is normal in structure. Aortic valve regurgitation is not visualized. No aortic stenosis is  present.  5. The inferior vena cava is normal in size with greater than 50% respiratory variability, suggesting right atrial pressure of 3 mmHg. FINDINGS  Left Ventricle: Left ventricular ejection fraction, by estimation, is 60 to 65%. The left ventricle has normal function. The left ventricle has no regional wall motion abnormalities. The left ventricular internal cavity size was normal in size. There is  no left ventricular hypertrophy. Left ventricular diastolic parameters were normal. Right Ventricle: The right ventricular size is normal. No increase in right ventricular wall thickness. Right ventricular systolic function is normal. There is normal pulmonary artery systolic pressure. The tricuspid regurgitant velocity is 2.39 m/s, and  with an assumed right atrial pressure of 3 mmHg, the estimated right ventricular systolic pressure is 62.6 mmHg. Left Atrium: Left atrial size was normal in size. Right Atrium: Right atrial size was normal in size. Pericardium: There is no evidence of pericardial effusion. Mitral Valve: The mitral valve is normal in structure. No evidence of mitral valve regurgitation. No evidence of mitral valve stenosis. Tricuspid Valve: The tricuspid valve is normal in structure. Tricuspid valve regurgitation is mild . No evidence of tricuspid stenosis. Aortic Valve: The aortic valve is normal in structure. Aortic valve regurgitation is not visualized. No aortic stenosis is present. Aortic valve mean gradient measures 4.0 mmHg. Aortic valve peak gradient measures 9.5 mmHg. Pulmonic Valve: The pulmonic valve was normal in structure. Pulmonic valve regurgitation is not visualized. No evidence of pulmonic stenosis. Aorta: The aortic root is normal in size and structure. Venous: The inferior vena cava is normal in size with greater than 50% respiratory variability, suggesting right atrial pressure of 3 mmHg. IAS/Shunts: No atrial level shunt detected by color flow Doppler.  LEFT VENTRICLE PLAX 2D  LVIDd:         3.80 cm     Diastology LVIDs:         2.20 cm     LV e' medial:    7.62 cm/s LV PW:         1.10 cm     LV E/e' medial:  8.7 LV IVS:        1.00 cm     LV e' lateral:   11.20 cm/s                            LV E/e' lateral: 5.9  LV Volumes (MOD) LV vol d, MOD A2C: 61.0 ml LV vol d, MOD A4C: 68.1 ml LV vol s, MOD A2C: 21.7 ml LV vol s, MOD A4C: 23.2 ml LV SV MOD A2C:     39.3  ml LV SV MOD A4C:     68.1 ml LV SV MOD BP:      43.3 ml RIGHT VENTRICLE RV Basal diam:  3.20 cm     PULMONARY VEINS RV Mid diam:    1.90 cm     A Reversal Duration: 95.00 msec RV S prime:     11.30 cm/s  A Reversal Velocity: 28.40 cm/s TAPSE (M-mode): 2.3 cm      Diastolic Velocity:  15.83 cm/s                             S/D Velocity:        1.40                             Systolic Velocity:   09.40 cm/s LEFT ATRIUM             Index       RIGHT ATRIUM           Index LA Vol (A2C):   32.5 ml 14.23 ml/m RA Area:     10.20 cm LA Vol (A4C):   26.4 ml 11.56 ml/m RA Volume:   18.70 ml  8.19 ml/m LA Biplane Vol: 29.5 ml 12.92 ml/m  AORTIC VALVE                   PULMONIC VALVE AV Vmax:           154.00 cm/s PV Vmax:       0.90 m/s AV Vmean:          90.100 cm/s PV Vmean:      65.600 cm/s AV VTI:            0.271 m     PV VTI:        0.172 m AV Peak Grad:      9.5 mmHg    PV Peak grad:  3.2 mmHg AV Mean Grad:      4.0 mmHg    PV Mean grad:  2.0 mmHg LVOT Vmax:         115.00 cm/s LVOT Vmean:        81.200 cm/s LVOT VTI:          0.197 m LVOT/AV VTI ratio: 0.73  AORTA Ao Asc diam: 3.10 cm MITRAL VALVE               TRICUSPID VALVE MV Area (PHT): 4.49 cm    TR Peak grad:   22.8 mmHg MV Decel Time: 169 msec    TR Vmax:        239.00 cm/s MV E velocity: 66.37 cm/s MV A velocity: 78.30 cm/s  SHUNTS MV E/A ratio:  0.85        Systemic VTI: 0.20 m Dani Gobble Croitoru MD Electronically signed by Sanda Klein MD Signature Date/Time: 09/05/2020/3:27:35 PM    Final    VAS [place] LOWER EXTREMITY VENOUS (DVT) (ONLY [facility] & [facility])  Result Date:  09/05/2020  Lower Venous DVT Study Patient Name:  SAACHI ZALE  Date of Exam:   09/04/2020 Medical Rec #: [id number]         Accession #:    [id number] Date of Birth: 04-04-1981         Patient Gender: F Patient Age:   69Y Exam Location:  [facility] Procedure:      VAS [place] LOWER  EXTREMITY VENOUS (DVT) Referring Phys: [id number] SHAWN C JOY --------------------------------------------------------------------------------  Indications: Pain.  Limitations: Body habitus and poor ultrasound/tissue interface. Comparison Study: Previous exam 09/07/15 - negative Performing Technologist: Rogelia Rohrer  Examination Guidelines: A complete evaluation includes B-mode imaging, spectral Doppler, color Doppler, and power Doppler as needed of all accessible portions of each vessel. Bilateral testing is considered an integral part of a complete examination. Limited examinations for reoccurring indications may be performed as noted. The reflux portion of the exam is performed with the patient in reverse Trendelenburg.  +-----+---------------+---------+-----------+----------+--------------+ RIGHTCompressibilityPhasicitySpontaneityPropertiesThrombus Aging +-----+---------------+---------+-----------+----------+--------------+ CFV  Full           Yes      Yes                                 +-----+---------------+---------+-----------+----------+--------------+   +---------+---------------+---------+-----------+----------+--------------+ LEFT     CompressibilityPhasicitySpontaneityPropertiesThrombus Aging +---------+---------------+---------+-----------+----------+--------------+ CFV      Full           Yes      Yes                                 +---------+---------------+---------+-----------+----------+--------------+ SFJ      Full                                                        +---------+---------------+---------+-----------+----------+--------------+ FV Prox  Full           Yes       Yes                                 +---------+---------------+---------+-----------+----------+--------------+ FV Mid   Full           Yes      Yes                                 +---------+---------------+---------+-----------+----------+--------------+ FV DistalFull           Yes      Yes                                 +---------+---------------+---------+-----------+----------+--------------+ PFV      Full                                                        +---------+---------------+---------+-----------+----------+--------------+ POP      Full           Yes      Yes                                 +---------+---------------+---------+-----------+----------+--------------+ PTV      Full                                                        +---------+---------------+---------+-----------+----------+--------------+  PERO     Full                                                        +---------+---------------+---------+-----------+----------+--------------+     Summary: RIGHT: - No evidence of common femoral vein obstruction.  LEFT: - There is no evidence of deep vein thrombosis in the lower extremity. - There is no evidence of superficial venous thrombosis.  - No cystic structure found in the popliteal fossa.  *See table(s) above for measurements and observations. Electronically signed by Deitra Mayo MD on 09/05/2020 at 6:53:13 AM.    Final     Microbiology: Recent Results (from the past 240 hour(s))  SARS CORONAVIRUS 2 (TAT 6-24 HRS) Nasopharyngeal Nasopharyngeal Swab     Status: None   Collection Time: 09/04/20  9:31 PM   Specimen: Nasopharyngeal Swab  Result Value Ref Range Status   SARS Coronavirus 2 NEGATIVE NEGATIVE Final    Comment: (NOTE) SARS-CoV-2 target nucleic acids are NOT DETECTED.  The SARS-CoV-2 RNA is generally detectable in upper and lower respiratory specimens during the acute phase of infection. Negative results do not  preclude SARS-CoV-2 infection, do not rule out co-infections with other pathogens, and should not be used as the sole basis for treatment or other patient management decisions. Negative results must be combined with clinical observations, patient history, and epidemiological information. The expected result is Negative.  Fact Sheet for Patients: SugarRoll.be  Fact Sheet for Healthcare Providers: https://www.Pavielle Biggar-mathews.com/  This test is not yet approved or cleared by the [place] FDA and  has been authorized for detection and/or diagnosis of SARS-CoV-2 by FDA under an Emergency Use Authorization (EUA). This EUA will remain  in effect (meaning this test can be used) for the duration of the COVID-19 declaration under Se ction 564(b)(1) of the Act, 21 U.S.C. section 360bbb-3(b)(1), unless the authorization is terminated or revoked sooner.  Performed at [facility], [place] [address], [place], [place] [address]      Labs: Basic Metabolic Panel: Recent Labs  Lab 09/04/20 1839 09/05/20 0536 09/06/20 0526  NA 135 135 136  K 4.0 3.9 3.6  CL 105 103 106  CO2 25 22 21*  GLUCOSE 91 96 104*  BUN _0 CREATININE 0.91 0.91 0.91  CALCIUM 9.5 9.3 9.0  MG  --   --  1.7  PHOS  --   --  3.9   Liver Function Tests: Recent Labs  Lab 09/04/20 1839 09/06/20 0526  AST 20 17  ALT 18 16  ALKPHOS 66 63  BILITOT 0.5 0.6  PROT 6.9 7.1  ALBUMIN 3.3* 3.1*   No results for input(s): LIPASE, AMYLASE in the last 168 hours. No results for input(s): AMMONIA in the last 168 hours. CBC: Recent Labs  Lab 09/04/20 1839 09/05/20 0536 09/06/20 0526  WBC 3.8* 4.5 4.5  NEUTROABS 2.2  --   --   HGB 10.3* 10.2* 10.2*  HCT 32.9* 32.7* 31.7*  MCV 83.7 83.6 81.3  PLT 323 291 314   Cardiac Enzymes: No results for input(s): CKTOTAL, CKMB, CKMBINDEX, TROPONINI in the last 168 hours. BNP: BNP (last 3 results) No results for input(s): BNP in  the last 8760 hours.  ProBNP (last 3 results) No results for input(s): PROBNP in the last 8760 hours.  CBG:  No results for input(s): GLUCAP in the last 168 hours.     Signed:  Dia Crawford, MD [facility]

## 2020-09-06 NOTE — Discharge Instructions (Signed)

## 2020-09-12 ENCOUNTER — Telehealth (HOSPITAL_COMMUNITY): Payer: Self-pay | Admitting: Pharmacist

## 2020-09-12 NOTE — Telephone Encounter (Signed)
Pharmacy Transitions of Care Follow-up Telephone Call  Date of discharge: 09/06/2020  How have you been since you were released from the hospital? Good, just a little tired  Medication changes made at discharge:  - START: Xarelto,   - STOPPED: cyclobenzaprine, YAZ, famotidine, sprionolactone  Medication changes verified by the patient? Yes     Medication Review: RIVAROXABAN (XARELTO)  Rivaroxaban 15 mg BID initiated on 05/26.  Will switch to 20 mg daily after 21 days June 16.   - Discussed importance of taking medication with food and around the same time everyday  - Reviewed potential DDIs with patient  - Advised patient of medications to avoid (NSAIDs, ASA)  - Educated that Tylenol (acetaminophen) will be the preferred analgesic to prevent risk of bleeding  - Emphasized importance of monitoring for signs and symptoms of bleeding (abnormal bruising, prolonged bleeding, nose bleeds, bleeding from gums, discolored urine, black tarry stools)  - Advised patient to alert all providers of anticoagulation therapy prior to starting a new medication or having a procedure    Follow-up Appointments:  PCP Hospital f/u appt confirmed? Yes  Specialist Hospital f/u appt confirmed? Yes  If their condition worsens, is the pt aware to call PCP or go to the Emergency Dept.? Yes  Final Patient Assessment: Patient reports she is doing well and gaining back her strength.  Reviewed current medications.

## 2020-09-28 ENCOUNTER — Other Ambulatory Visit: Payer: Self-pay

## 2020-09-28 ENCOUNTER — Encounter (HOSPITAL_COMMUNITY): Payer: Self-pay | Admitting: *Deleted

## 2020-09-28 ENCOUNTER — Emergency Department (HOSPITAL_COMMUNITY): Payer: BC Managed Care – PPO

## 2020-09-28 ENCOUNTER — Emergency Department (HOSPITAL_COMMUNITY)
Admission: EM | Admit: 2020-09-28 | Discharge: 2020-09-29 | Disposition: A | Discharge status: Home / Self Care | Payer: BC Managed Care – PPO | Attending: Emergency Medicine | Admitting: Emergency Medicine

## 2020-09-28 DIAGNOSIS — I10 Essential (primary) hypertension: Secondary | ICD-10-CM | POA: Insufficient documentation

## 2020-09-28 DIAGNOSIS — Z7901 Long term (current) use of anticoagulants: Secondary | ICD-10-CM | POA: Diagnosis not present

## 2020-09-28 DIAGNOSIS — R072 Precordial pain: Secondary | ICD-10-CM | POA: Insufficient documentation

## 2020-09-28 DIAGNOSIS — R079 Chest pain, unspecified: Secondary | ICD-10-CM

## 2020-09-28 DIAGNOSIS — Z86711 Personal history of pulmonary embolism: Secondary | ICD-10-CM

## 2020-09-28 HISTORY — DX: Other pulmonary embolism without acute cor pulmonale: I26.99

## 2020-09-28 LAB — CBC WITH DIFFERENTIAL/PLATELET
Abs Immature Granulocytes: 0.04 10*3/uL (ref 0.00–0.07)
Basophils Absolute: 0 10*3/uL (ref 0.0–0.1)
Basophils Relative: 0 %
Eosinophils Absolute: 0.1 10*3/uL (ref 0.0–0.5)
Eosinophils Relative: 1 %
HCT: 27 % — ABNORMAL LOW (ref 36.0–46.0)
Hemoglobin: 8.3 g/dL — ABNORMAL LOW (ref 12.0–15.0)
Immature Granulocytes: 1 %
Lymphocytes Relative: 33 %
Lymphs Abs: 2.4 10*3/uL (ref 0.7–4.0)
MCH: 25 pg — ABNORMAL LOW (ref 26.0–34.0)
MCHC: 30.7 g/dL (ref 30.0–36.0)
MCV: 81.3 fL (ref 80.0–100.0)
Monocytes Absolute: 0.4 10*3/uL (ref 0.1–1.0)
Monocytes Relative: 5 %
Neutro Abs: 4.4 10*3/uL (ref 1.7–7.7)
Neutrophils Relative %: 60 %
Platelets: 415 10*3/uL — ABNORMAL HIGH (ref 150–400)
RBC: 3.32 MIL/uL — ABNORMAL LOW (ref 3.87–5.11)
RDW: 14.6 % (ref 11.5–15.5)
WBC: 7.4 10*3/uL (ref 4.0–10.5)
nRBC: 0 % (ref 0.0–0.2)

## 2020-09-28 LAB — I-STAT BETA HCG BLOOD, ED (MC, WL, AP ONLY): I-stat hCG, quantitative: <5 m[IU]/mL (ref <5)

## 2020-09-28 LAB — COMPREHENSIVE METABOLIC PANEL
ALT: 13 U/L (ref 0–44)
AST: 15 U/L (ref 15–41)
Albumin: 3.4 g/dL — ABNORMAL LOW (ref 3.5–5.0)
Alkaline Phosphatase: 71 U/L (ref 38–126)
Anion gap: 8 (ref 5–15)
BUN: 8 mg/dL (ref 6–20)
CO2: 24 mmol/L (ref 22–32)
Calcium: 9.2 mg/dL (ref 8.9–10.3)
Chloride: 104 mmol/L (ref 98–111)
Creatinine, Ser: 0.88 mg/dL (ref 0.44–1.00)
GFR, Estimated: >60 mL/min (ref >60)
Glucose, Bld: 89 mg/dL (ref 70–99)
Potassium: 3.6 mmol/L (ref 3.5–5.1)
Sodium: 136 mmol/L (ref 135–145)
Total Bilirubin: 0.5 mg/dL (ref 0.3–1.2)
Total Protein: 6.9 g/dL (ref 6.5–8.1)

## 2020-09-28 LAB — TROPONIN I (HIGH SENSITIVITY)
Troponin I (High Sensitivity): 3 ng/L (ref <18)
Troponin I (High Sensitivity): 4 ng/L (ref <18)

## 2020-09-28 NOTE — ED Triage Notes (Signed)
The pt has a positive pe  on may 24th.  She was placed on zarelto  since yesterday she has had more pain and sob lmp may until June 5th

## 2020-09-28 NOTE — ED Provider Notes (Signed)
Emergency Medicine Provider Triage Evaluation Note  Felicia Pham , a 40 y.o. female  was evaluated in triage.  Pt complains of chest pressure.  She has known PE and is compliant with her medications however is now having chest pressure that is Different..  Review of Systems  Positive: Chest pressure Negative: Syncope  Physical Exam  BP 126/85 (BP Location: Left Arm)   Pulse 92   Temp 98.7 F (37.1 C) (Oral)   Resp 14   Ht 5\' 4"  (1.626 m)   Wt 59.2 kg   LMP 09/16/2020   SpO2 100%   BMI 22.40 kg/m  Gen:   Awake, no distress   Resp:  Normal effort  MSK:   Moves extremities without difficulty  Other:  Speaks in full sentences.   Medical Decision Making  Medically screening exam initiated at 5:49 PM.  Appropriate orders placed.  Ulah Olmo Noguez was informed that the remainder of the evaluation will be completed by another provider, this initial triage assessment does not replace that evaluation, and the importance of remaining in the ED until their evaluation is complete.  Patient has known PE now with chest pressure.  This is worse per her report and not what she had been having previously.  She reports compliance with her anticoagulants. Clinically concern for increased clot burden with a change in her symptoms. PE study is ordered.   Ollen Gross 09/28/20 2129    Truddie Hidden, MD 09/28/20 2234

## 2020-09-29 ENCOUNTER — Emergency Department (HOSPITAL_COMMUNITY): Payer: BC Managed Care – PPO

## 2020-09-29 MED ORDER — IOHEXOL 350 MG/ML SOLN
75.0000 mL | Freq: Once | INTRAVENOUS | Status: AC | PRN
  Administered 2020-09-29: 75 mL via INTRAVENOUS

## 2020-09-29 NOTE — Discharge Instructions (Addendum)
Continue taking Xarelto as previously prescribed.  Begin taking omeprazole 20 mg twice daily for the next 2 weeks.  Take Tylenol 1000 mg every 6 hours as needed for pain.  Follow-up with primary doctor if symptoms or not improving in the next week, and return to the ER if symptoms significantly worsen or change.

## 2020-09-29 NOTE — ED Provider Notes (Signed)
[facility] Provider Note   CSN: [id number] Arrival date & time: 09/28/20  1648     History Chief Complaint  Patient presents with  . Chest Pain    Felicia Pham is a 40 y.o. female.  Patient is a 40 year old female with history of anemia, hyperlipidemia, and recent diagnosis of pulmonary embolism.  Patient was diagnosed with this approximately 3 weeks ago.  She has been on Xarelto since.  She presents today with complaints of heaviness to the front of her chest that began earlier today.  She denies any shortness of breath, nausea, diaphoresis, or radiation.  She denies any recent exertional symptoms.  Patient has no cardiac risk factors.  The history is provided by the patient.  Chest Pain Pain location:  Substernal area Pain quality: tightness   Pain radiates to:  Does not radiate Pain severity:  Moderate Onset quality:  Sudden Duration:  12 hours Timing:  Constant Progression:  Unchanged Chronicity:  New Context: not breathing   Relieved by:  Nothing Ineffective treatments:  None tried     Past Medical History:  Diagnosis Date  . PE (pulmonary thromboembolism) ([place])   . Rib fracture   . Uterine fibroid     Patient Active Problem List   Diagnosis Date Noted  . Pulmonary embolism ([place]) 09/05/2020  . Anemia 09/05/2020  . Obesity, Class III, BMI 40-49.9 (morbid obesity) ([place]) 09/05/2020  . Consolidation of right lower lobe of lung ([place]) 09/05/2020  . Lower abdominal pain 03/30/2018  . Abnormal glucose 03/30/2018  . Acute bronchitis 03/30/2018  . Acute pharyngitis, unspecified 03/30/2018  . Backache 03/30/2018  . Chest pain 03/30/2018  . Diarrhea, unspecified 03/30/2018  . Dizziness and giddiness 03/30/2018  . Generalized muscle weakness 03/30/2018  . Goiter 03/30/2018  . Malignant essential hypertension 03/30/2018  . Mixed hyperlipidemia 03/30/2018  . Body mass index 37.0-37.9, adult 03/30/2018  . Nausea 03/30/2018   . Other long term (current) drug therapy 03/30/2018  . Other malaise and fatigue 03/30/2018  . Pain in left leg 03/30/2018  . Gastroesophageal reflux disease 06/09/2017  . 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 Myalgia and myositis 05/26/2013  . Pain in joint, shoulder region 05/26/2013  . Other iron deficiency anemias 02/25/2013  . Pelvic pain in female 11/17/2012  . Other and unspecified ovarian cyst 11/02/2012  . Leiomyoma of uterus, unspecified 08/05/2012  . Impaired fasting glucose 01/31/2010  . Need for prophylactic vaccination and inoculation against influenza 01/31/2010    Past Surgical History:  Procedure Laterality Date  . MYOMECTOMY  2014     OB History     Gravida  0   Para  0   Term  0   Preterm  0   AB  0   Living  0      SAB  0   IAB  0   Ectopic  0   Multiple  0   Live Births              Family History  Problem Relation Age of Onset  . Lupus Mother   . Hypertension Mother   . Hypertension Father   . Diabetes Father   . Heart attack Father 32  . Diabetes Maternal Grandmother   . Diabetes Paternal Grandmother     Social History   Tobacco Use  . Smoking status: Never  . Smokeless tobacco: Never  Vaping Use  . Vaping Use: Never used  Substance Use Topics  . Alcohol use: No  . Drug use: No    Home Medications Prior to Admission medications   Medication Sig Start Date End Date Taking? Authorizing Provider  acetaminophen (TYLENOL) 325 MG tablet Take 2 tablets (650 mg total) by mouth every 6 (six) hours as needed for headache or mild pain. 09/06/20   Allie Bossier, MD  cetirizine (ZYRTEC ALLERGY) 10 MG tablet Take 1 tablet (10 mg total) by mouth daily. 11/11/19   Darr, Edison Nasuti, PA-C  fluticasone (CUTIVATE) 0.05 % cream Apply 1 application topically in the morning and at bedtime. 08/13/20   [provider]  hydrocortisone 2.5  % cream Apply 1 application topically daily as needed (rash, dry skin). 01/31/18   [provider]  ibuprofen (ADVIL,MOTRIN) 200 MG tablet Take 600-800 mg by mouth every 6 (six) hours as needed for moderate pain. Reported on 09/25/2015    [provider]  ketoconazole (NIZORAL) 2 % cream Apply 1 application topically 2 (two) times daily. 08/13/20   [provider]  ketoconazole (NIZORAL) 2 % shampoo Apply 1 application topically every 14 (fourteen) days. 08/13/20   [provider]  RIVAROXABAN [employer]) VTE STARTER PACK (15 & 20 MG) take $Remove'15mg'iKacfOS$  tablet twice daily for 21 days then decrease to $RemoveBef'20mg'qjmdaLOwwf$  tablet once daily 09/06/20   Allie Bossier, MD  rivaroxaban (XARELTO) 20 MG TABS tablet Take 1 tablet (20 mg total) by mouth daily with supper. 09/27/20   Allie Bossier, MD  rivaroxaban Al[employer] Re[employer]) KIT Educational material for Xarelto 09/06/20   Allie Bossier, MD    Allergies    Patient has no known allergies.  Review of Systems   Review of Systems  Cardiovascular:  Positive for chest pain.  All other systems reviewed and are negative.  Physical Exam Updated Vital Signs BP 117/75 (BP Location: Left Arm)   Pulse 92   Temp 98.2 F (36.8 C) (Oral)   Resp 19   Ht $R'5\' 4"'eB$  (1.626 m)   Wt 59.2 kg   LMP 09/16/2020   SpO2 100%   BMI 22.40 kg/m   Physical Exam Vitals and nursing note reviewed.  Constitutional:      General: She is not in acute distress.    Appearance: She is well-developed. She is not diaphoretic.  HENT:     Head: Normocephalic and atraumatic.  Cardiovascular:     Rate and Rhythm: Normal rate and regular rhythm.     Heart sounds: No murmur heard.   No friction rub. No gallop.  Pulmonary:     Effort: Pulmonary effort is normal. No respiratory distress.     Breath sounds: Normal breath sounds. No wheezing.  Abdominal:     General: Bowel sounds are normal. There is no distension.     Palpations: Abdomen is soft.     Tenderness: There is no  abdominal tenderness.  Musculoskeletal:        General: Normal range of motion.     Cervical back: Normal range of motion and neck supple.     Right lower leg: No tenderness. No edema.     Left lower leg: No tenderness. No edema.  Skin:    General: Skin is warm and dry.  Neurological:     General: No focal deficit present.     Mental Status: She is alert and oriented to person, place, and time.    ED Results / Procedures / Treatments  Labs (all labs ordered are listed, but only abnormal results are displayed) Labs Reviewed  CBC WITH DIFFERENTIAL/PLATELET - Abnormal; Notable for the following components:      Result Value   RBC 3.32 (*)    Hemoglobin 8.3 (*)    HCT 27.0 (*)    MCH 25.0 (*)    Platelets 415 (*)    All other components within normal limits  COMPREHENSIVE METABOLIC PANEL - Abnormal; Notable for the following components:   Albumin 3.4 (*)    All other components within normal limits  I-STAT BETA HCG BLOOD, ED ([facility], [facility], [facility] ONLY)  TROPONIN I (HIGH SENSITIVITY)  TROPONIN I (HIGH SENSITIVITY)    EKG EKG Interpretation  Date/Time:  Friday September 28 2020 17:43:59 EDT Ventricular Rate:  80 PR Interval:  116 QRS Duration: 74 QT Interval:  322 QTC Calculation: 371 R Axis:   32 Text Interpretation: Normal sinus rhythm Normal ECG No significant change since last tracing Confirmed by Calvert Cantor [phone]) on 09/28/2020 6:54:46 PM  Radiology DG Chest 2 View  Result Date: 09/28/2020 CLINICAL DATA:  Chest pressure EXAM: CHEST - 2 VIEW COMPARISON:  09/04/2020 FINDINGS: The heart size and mediastinal contours are within normal limits. Both lungs are clear. The visualized skeletal structures are unremarkable. IMPRESSION: No active cardiopulmonary disease. Electronically Signed   By: Donavan Foil M.D.   On: 09/28/2020 18:40   CT Angio Chest PE W and/or Wo Contrast  Result Date: 09/29/2020 CLINICAL DATA:  History of pulmonary embolus, chest pressure, short of breath,  anticoagulated EXAM: CT ANGIOGRAPHY CHEST WITH CONTRAST TECHNIQUE: Multidetector CT imaging of the chest was performed using the standard protocol during bolus administration of intravenous contrast. Multiplanar CT image reconstructions and MIPs were obtained to evaluate the vascular anatomy. CONTRAST:  39mL OMNIPAQUE IOHEXOL 350 MG/ML SOLN COMPARISON:  09/04/2020 FINDINGS: Cardiovascular: This is a technically adequate evaluation of the pulmonary vasculature. There are no filling defects or pulmonary emboli on this exam. There has been complete resolution of the pulmonary emboli seen previously. The heart is mildly enlarged without pericardial effusion, stable. No evidence of thoracic aortic aneurysm or dissection. Mediastinum/Nodes: No enlarged mediastinal, hilar, or axillary lymph nodes. Thyroid gland, trachea, and esophagus demonstrate no significant findings. Lungs/Pleura: No acute airspace disease, effusion, or pneumothorax. Central airways are widely patent. Upper Abdomen: No acute abnormality. Musculoskeletal: No acute or destructive bony lesions. Reconstructed images demonstrate no additional findings. Review of the MIP images confirms the above findings. IMPRESSION: 1. No evidence of pulmonary embolus. Resolution of the left lower lobe pulmonary emboli seen previously. 2. No acute intrathoracic process. Electronically Signed   By: Randa Ngo M.D.   On: 09/29/2020 00:32    Procedures Procedures   Medications Ordered in ED Medications  iohexol (OMNIPAQUE) 350 MG/ML injection 75 mL (75 mLs Intravenous Contrast Given 09/29/20 0017)    ED Course  I have reviewed the triage vital signs and the nursing notes.  Pertinent labs & imaging results that were available during my care of the patient were reviewed by me and considered in my medical decision making (see chart for details).    MDM Rules/Calculators/A&P  Patient presenting with chest discomfort as described in the HPI.  She was recently  diagnosed with pulmonary embolism and reports being compliant with her Xarelto.  Patient was seen in triage and CTA of the chest was ordered and obtained.  This showed resolution of the previously seen pulmonary emboli.  Patient's work-up shows no evidence for a cardiac  etiology.  Her EKG is normal and troponin x2 is negative.  I am uncertain as to the exact etiology of her symptoms, but nothing appears emergent.  She will be discharged with Tylenol, continued Xarelto, and return as needed if she worsens.  Final Clinical Impression(s) / ED Diagnoses Final diagnoses:  None    Rx / DC Orders ED Discharge Orders     None        Veryl Speak, MD 09/29/20 [phone]

## 2020-10-07 ENCOUNTER — Ambulatory Visit (HOSPITAL_COMMUNITY)
Admission: EM | Admit: 2020-10-07 | Discharge: 2020-10-07 | Disposition: A | Discharge status: Home / Self Care | Payer: BC Managed Care – PPO

## 2020-10-07 ENCOUNTER — Other Ambulatory Visit: Payer: Self-pay

## 2020-10-07 ENCOUNTER — Encounter (HOSPITAL_COMMUNITY): Payer: Self-pay

## 2020-10-07 DIAGNOSIS — J069 Acute upper respiratory infection, unspecified: Secondary | ICD-10-CM | POA: Diagnosis not present

## 2020-10-07 DIAGNOSIS — R0981 Nasal congestion: Secondary | ICD-10-CM

## 2020-10-07 DIAGNOSIS — H938X3 Other specified disorders of ear, bilateral: Secondary | ICD-10-CM | POA: Diagnosis not present

## 2020-10-07 MED ORDER — FLUTICASONE PROPIONATE 50 MCG/ACT NA SUSP: 2.0000 | Freq: Every day | NASAL | 2 refills | Status: DC

## 2020-10-07 MED ORDER — CETIRIZINE-PSEUDOEPHEDRINE ER 5-120 MG PO TB12: 1.0000 | ORAL_TABLET | Freq: Every day | ORAL | 0 refills | Status: DC

## 2020-10-07 NOTE — ED Provider Notes (Signed)
[employer] [facility]   [id number] 10/07/20 Arrival Time: 1011   CC: COVID symptoms  SUBJECTIVE: History from: patient.  Felicia Pham is a 40 y.o. female who presents with bilateral ear pain, swollen glands, sore throat for the last 4 days. Denies sick exposure to COVID, flu or strep. Denies recent travel. Has negative history of Covid. Has completed Covid vaccines. Has not taken OTC medications for this. There are no aggravating or alleviating factors. Denies previous symptoms in the past. Reports that she recently has had pneumonia and a pulmonary embolism. Is currently on anticoagulation therapy. Denies fever, chills, fatigue, sinus pain, SOB, wheezing, chest pain, nausea, changes in bowel or bladder habits.    ROS: As per HPI.  All other pertinent ROS negative.     Past Medical History:  Diagnosis Date   PE (pulmonary thromboembolism) ([place])    Rib fracture    Uterine fibroid    Past Surgical History:  Procedure Laterality Date   MYOMECTOMY  2014   No Known Allergies No current facility-administered medications on file prior to encounter.   Current Outpatient Medications on File Prior to Encounter  Medication Sig Dispense Refill   acetaminophen (TYLENOL) 325 MG tablet Take 2 tablets (650 mg total) by mouth every 6 (six) hours as needed for headache or mild pain. 60 tablet 0   fluticasone (CUTIVATE) 0.05 % cream Apply 1 application topically in the morning and at bedtime.     hydrocortisone 2.5 % cream Apply 1 application topically daily as needed (rash, dry skin).  2   ketoconazole (NIZORAL) 2 % cream Apply 1 application topically 2 (two) times daily.     ketoconazole (NIZORAL) 2 % shampoo Apply 1 application topically every 14 (fourteen) days.     rivaroxaban (XARELTO) 20 MG TABS tablet Take 1 tablet (20 mg total) by mouth daily with supper. 30 tablet 0   traMADol (ULTRAM) 50 MG tablet Take by mouth as needed.     ibuprofen (ADVIL,MOTRIN) 200 MG tablet Take 600-800 mg by  mouth every 6 (six) hours as needed for moderate pain. Reported on 09/25/2015     RIVAROXABAN (XARELTO) VTE STARTER PACK (15 & 20 MG) take $Remove'15mg'mjsguNr$  tablet twice daily for 21 days then decrease to $RemoveBef'20mg'dzLtwGLssF$  tablet once daily 51 each 0   rivaroxaban (XARELTO) KIT Educational material for Xarelto 1 kit 0   Social History   Socioeconomic History   Marital status: Single    Spouse name: Not on file   Number of children: Not on file   Years of education: Not on file   Highest education level: Not on file  Occupational History   Not on file  Tobacco Use   Smoking status: Never   Smokeless tobacco: Never  Vaping Use   Vaping Use: Never used  Substance and Sexual Activity   Alcohol use: No   Drug use: No   Sexual activity: Yes    Partners: Male    Birth control/protection: Pill  Other Topics Concern   Not on file  Social History Narrative   Not on file   Social Determinants of Health   Financial Resource Strain: Not on file  Food Insecurity: Not on file  Transportation Needs: Not on file  Physical Activity: Not on file  Stress: Not on file  Social Connections: Not on file  Intimate Partner Violence: Not on file   Family History  Problem Relation Age of Onset   Lupus Mother    Hypertension Mother  Hypertension Father    Diabetes Father    Heart attack Father 40   Diabetes Maternal Grandmother    Diabetes Paternal Grandmother     OBJECTIVE:  Vitals:   10/07/20 1043  BP: 111/79  Pulse: (!) 107  Resp: 18  Temp: 98.5 F (36.9 C)  SpO2: 98%     General appearance: alert; appears fatigued, but nontoxic; speaking in full sentences and tolerating own secretions HEENT: NCAT; Ears: EACs clear, TMs pearly gray; Eyes: PERRL.  EOM grossly intact. Sinuses: nontender; Nose: nares patent with clear rhinorrhea, Throat: oropharynx erythematous, cobblestoning present, tonsils non erythematous or enlarged, uvula midline  Neck: supple with LAD Lungs: unlabored respirations, symmetrical air  entry; cough: absent; no respiratory distress; CTAB Heart: regular rate and rhythm.  Radial pulses 2+ symmetrical bilaterally Skin: warm and dry Psychological: alert and cooperative; normal mood and affect  LABS:  No results found for this or any previous visit (from the past 24 hour(s)).   ASSESSMENT & PLAN:  1. Viral URI   2. Nasal congestion   3. Ear fullness, bilateral     Meds ordered this encounter  Medications   cetirizine-pseudoephedrine (ZYRTEC-D) 5-120 MG tablet    Sig: Take 1 tablet by mouth daily.    Dispense:  30 tablet    Refill:  0    Order Specific Question:   Supervising Provider    Answer:   Chase Picket [[id number]]   fluticasone (FLONASE) 50 MCG/ACT nasal spray    Sig: Place 2 sprays into both nostrils daily.    Dispense:  16 g    Refill:  2    Order Specific Question:   Supervising Provider    Answer:   Chase Picket [[id number]]    Continue supportive care at home Get plenty of rest and push fluids Use OTC zyrtec D for nasal congestion, runny nose, and/or sore throat Use OTC flonase for nasal congestion and runny nose Use medications daily for symptom relief Use OTC medications like ibuprofen or tylenol as needed fever or pain Call or go to the ED if you have any new or worsening symptoms such as fever, worsening cough, shortness of breath, chest tightness, chest pain, turning blue, changes in mental status.  Reviewed expectations re: course of current medical issues. Questions answered. Outlined signs and symptoms indicating need for more acute intervention. Patient verbalized understanding. After Visit Summary given.          Faustino Congress, NP 10/07/20 1109

## 2020-10-07 NOTE — Discharge Instructions (Addendum)
I  have sent in zyrtec D for you to take in the mornings  I have sent in flonase for you to use 2 sprays per nostril daily as needed for nasal congestion.  Follow up with this office or with primary care if symptoms are persisting.  Follow up in the ER for high fever, trouble swallowing, trouble breathing, other concerning symptoms.

## 2020-10-07 NOTE — ED Triage Notes (Addendum)
Pt c/o bilateral ear pain/fullness, sense of "throat fullness"/feeling glands on neck are swollen. Reports sensation of whooshing in ears. Started about 4 days ago.

## 2021-01-16 ENCOUNTER — Telehealth: Payer: Self-pay | Admitting: Obstetrics

## 2021-01-16 MED ORDER — METRONIDAZOLE 500 MG PO TABS
500.0000 mg | ORAL_TABLET | Freq: Two times a day (BID) | ORAL | 0 refills | Status: DC
Start: 2021-01-16 — End: 2021-04-11

## 2021-01-16 NOTE — Telephone Encounter (Signed)
Patient called requesting a prescription for BV.  She has had BV before and the symptoms are the same.    Rx routed to pharmacy per protocol.

## 2021-02-11 ENCOUNTER — Ambulatory Visit (HOSPITAL_COMMUNITY)
Admission: EM | Admit: 2021-02-11 | Discharge: 2021-02-11 | Disposition: A | Discharge status: Home / Self Care | Payer: BC Managed Care – PPO | Attending: Emergency Medicine | Admitting: Emergency Medicine

## 2021-02-11 ENCOUNTER — Other Ambulatory Visit: Payer: Self-pay

## 2021-02-11 ENCOUNTER — Encounter (HOSPITAL_COMMUNITY): Payer: Self-pay

## 2021-02-11 DIAGNOSIS — R21 Rash and other nonspecific skin eruption: Secondary | ICD-10-CM

## 2021-02-11 MED ORDER — PREDNISONE 10 MG (21) PO TBPK: ORAL_TABLET | Freq: Every day | ORAL | 0 refills | Status: DC

## 2021-02-11 NOTE — ED Triage Notes (Signed)
Pt presents with insect bites on different areas of her body that she noticed yesterday.

## 2021-02-11 NOTE — Discharge Instructions (Addendum)
Take prednisone every morning with food as directed on the package  May continue use of Zyrtec which should help with itching, if needing additional comfort May take oral or topical Benadryl  May follow-up at urgent care as needed for worsening or persistent rash

## 2021-02-12 NOTE — ED Provider Notes (Signed)
[facility]    CSN: [id number] Arrival date & time: 02/11/21  1634      History   Chief Complaint Chief Complaint  Patient presents with   Insect Bite    HPI Felicia Pham is a 40 y.o. female.   Patient presents with rash for 2 days.  Erythematous and pruritic.  Began after sleeping in bed at parents home.  Rash initially began on neck but has spread to upper chest and right upper arm.  Denies drainage, fever, chills, shortness of breath, wheezing, itchy throat, difficulty swallowing, changes to lotions, soaps,, linens, diet or recent travel.  Patient endorses rash from a day to a possible bug bite however did not witness this event.  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 ([place]) 09/05/2020   Lower abdominal pain 03/30/2018   Abnormal glucose 03/30/2018   Acute bronchitis 03/30/2018   Acute pharyngitis, unspecified 03/30/2018   Backache 03/30/2018   Chest pain 03/30/2018   Diarrhea, unspecified 03/30/2018   Dizziness and giddiness 03/30/2018   Generalized muscle weakness 03/30/2018   Goiter 03/30/2018   Malignant essential hypertension 03/30/2018   Mixed hyperlipidemia 03/30/2018   Body mass index 37.0-37.9, adult 03/30/2018   Nausea 03/30/2018   Other long term (current) drug therapy 03/30/2018   Other malaise and fatigue 03/30/2018   Pain in left leg 03/30/2018   Gastroesophageal reflux disease 06/09/2017   Mild dysplasia of cervix 01/05/2014   Papanicolaou smear of cervix with low grade squamous intraepithelial lesion (LGSIL) 12/12/2013   Myalgia and myositis 05/26/2013   Pain in joint, shoulder region 05/26/2013   Other iron deficiency anemias  02/25/2013   Pelvic pain in female 11/17/2012   Other and unspecified ovarian cyst 11/02/2012   Leiomyoma of uterus, unspecified 08/05/2012   Impaired fasting glucose 01/31/2010   Need for prophylactic vaccination and inoculation against influenza 01/31/2010    Past Surgical History:  Procedure Laterality Date   MYOMECTOMY  2014    OB History     Gravida  0   Para  0   Term  0   Preterm  0   AB  0   Living  0      SAB  0   IAB  0   Ectopic  0   Multiple  0   Live Births               Home Medications    Prior to Admission medications   Medication Sig Start Date End Date Taking? Authorizing Provider  predniSONE (STERAPRED UNI-PAK 21 TAB) 10 MG (21) TBPK tablet Take by mouth daily. Take 6 tabs by mouth daily  for 2 days, then 5 tabs for 2 days, then 4 tabs for 2 days, then 3 tabs for 2 days, 2 tabs for 2 days, then 1 tab by mouth daily for 2 days 02/11/21  Yes Umer Harig, Leitha Schuller, NP  acetaminophen (TYLENOL) 325 MG tablet Take 2 tablets (650 mg total) by mouth every 6 (six) hours as needed for headache or mild pain. 09/06/20   Allie Bossier, MD  cetirizine-pseudoephedrine (ZYRTEC-D) 5-120 MG tablet Take 1 tablet by mouth daily.  10/07/20   Faustino Congress, NP  fluticasone (CUTIVATE) 0.05 % cream Apply 1 application topically in the morning and at bedtime. 08/13/20   [provider]  fluticasone (FLONASE) 50 MCG/ACT nasal spray Place 2 sprays into both nostrils daily. 10/07/20   Faustino Congress, NP  hydrocortisone 2.5 % cream Apply 1 application topically daily as needed (rash, dry skin). 01/31/18   [provider]  ibuprofen (ADVIL,MOTRIN) 200 MG tablet Take 600-800 mg by mouth every 6 (six) hours as needed for moderate pain. Reported on 09/25/2015    [provider]  ketoconazole (NIZORAL) 2 % cream Apply 1 application topically 2 (two) times daily. 08/13/20   [provider]  ketoconazole (NIZORAL) 2 % shampoo Apply 1  application topically every 14 (fourteen) days. 08/13/20   [provider]  metroNIDAZOLE (FLAGYL) 500 MG tablet Take 1 tablet (500 mg total) by mouth 2 (two) times daily. 01/16/21   Shelly Bombard, MD  RIVAROXABAN Alveda Reasons) VTE STARTER PACK (15 & 20 MG) take $Remove'15mg'uiBDhAW$  tablet twice daily for 21 days then decrease to $RemoveBef'20mg'qMOpPmTyTY$  tablet once daily 09/06/20   Allie Bossier, MD  rivaroxaban (XARELTO) 20 MG TABS tablet Take 1 tablet (20 mg total) by mouth daily with supper. 09/27/20   Allie Bossier, MD  rivaroxaban Alveda Reasons) KIT Educational material for Xarelto 09/06/20   Allie Bossier, MD  traMADol Veatrice Bourbon) 50 MG tablet Take by mouth as needed.    [provider]    Family History Family History  Problem Relation Age of Onset   Lupus Mother    Hypertension Mother    Hypertension Father    Diabetes Father    Heart attack Father 37   Diabetes Maternal Grandmother    Diabetes Paternal Grandmother     Social History Social History   Tobacco Use   Smoking status: Never   Smokeless tobacco: Never  Vaping Use   Vaping Use: Never used  Substance Use Topics   Alcohol use: No   Drug use: No     Allergies   Patient has no known allergies.   Review of Systems Review of Systems  Constitutional: Negative.   Respiratory: Negative.    Cardiovascular: Negative.   Gastrointestinal: Negative.   Musculoskeletal: Negative.   Skin:  Positive for rash. Negative for color change, pallor and wound.  Neurological: Negative.     Physical Exam Triage Vital Signs ED Triage Vitals  Enc Vitals Group     BP 02/11/21 1744 124/87     Pulse Rate 02/11/21 1744 97     Resp 02/11/21 1744 18     Temp 02/11/21 1744 98.2 F (36.8 C)     Temp Source 02/11/21 1744 Oral     SpO2 02/11/21 1744 (!) 10 %     Weight --      Height --      Head Circumference --      Peak Flow --      Pain Score 02/11/21 1748 2     Pain Loc --      Pain Edu? --      Excl. in [place]? --    No data  found.  Updated Vital Signs BP 124/87 (BP Location: Left Arm)   Pulse 97   Temp 98.2 F (36.8 C) (Oral)   Resp 18   SpO2 (!) 10%   Visual Acuity Right Eye Distance:   Left Eye Distance:   Bilateral Distance:    Right Eye Near:  Left Eye Near:    Bilateral Near:     Physical Exam Constitutional:      Appearance: Normal appearance. She is normal weight.  HENT:     Head: Normocephalic.  Eyes:     Extraocular Movements: Extraocular movements intact.  Pulmonary:     Effort: Pulmonary effort is normal.  Skin:    Comments: Diffuse erythematous macular rash present on the left lateral aspect of neck, the left upper chest and the right medial upper aspect of arm  Neurological:     Mental Status: She is alert and oriented to person, place, and time. Mental status is at baseline.  Psychiatric:        Mood and Affect: Mood normal.        Behavior: Behavior normal.     UC Treatments / Results  Labs (all labs ordered are listed, but only abnormal results are displayed) Labs Reviewed - No data to display  EKG   Radiology No results found.  Procedures Procedures (including critical care time)  Medications Ordered in UC Medications - No data to display  Initial Impression / Assessment and Plan / UC Course  I have reviewed the triage vital signs and the nursing notes.  Pertinent labs & imaging results that were available during my care of the patient were reviewed by me and considered in my medical decision making (see chart for details).  Rash  Discussed unknown etiology of symptoms with patient, will treat for symptoms, patient in agreement with plan of care  1 prednisone 60 mg taper 2.  Recommended antihistamine to manage itching 3.  Return precautions given to follow-up at urgent care for persistent or reoccurring rash Final Clinical Impressions(s) / UC Diagnoses   Final diagnoses:  Rash     Discharge Instructions      Take prednisone every morning with  food as directed on the package  May continue use of Zyrtec which should help with itching, if needing additional comfort May take oral or topical Benadryl  May follow-up at urgent care as needed for worsening or persistent rash   ED Prescriptions     Medication Sig Dispense Auth. Provider   predniSONE (STERAPRED UNI-PAK 21 TAB) 10 MG (21) TBPK tablet Take by mouth daily. Take 6 tabs by mouth daily  for 2 days, then 5 tabs for 2 days, then 4 tabs for 2 days, then 3 tabs for 2 days, 2 tabs for 2 days, then 1 tab by mouth daily for 2 days 42 tablet Chantell Kunkler, Leitha Schuller, NP      PDMP not reviewed this encounter.   Hans Eden, [place] 02/12/21 [phone]

## 2021-04-10 ENCOUNTER — Encounter (HOSPITAL_BASED_OUTPATIENT_CLINIC_OR_DEPARTMENT_OTHER): Payer: Self-pay

## 2021-04-10 ENCOUNTER — Other Ambulatory Visit: Payer: Self-pay

## 2021-04-10 ENCOUNTER — Emergency Department (HOSPITAL_BASED_OUTPATIENT_CLINIC_OR_DEPARTMENT_OTHER): Payer: BC Managed Care – PPO | Admitting: Radiology

## 2021-04-10 ENCOUNTER — Emergency Department (HOSPITAL_BASED_OUTPATIENT_CLINIC_OR_DEPARTMENT_OTHER)
Admission: EM | Admit: 2021-04-10 | Discharge: 2021-04-11 | Disposition: A | Discharge status: Home / Self Care | Payer: BC Managed Care – PPO | Attending: Emergency Medicine | Admitting: Emergency Medicine

## 2021-04-10 ENCOUNTER — Emergency Department (HOSPITAL_BASED_OUTPATIENT_CLINIC_OR_DEPARTMENT_OTHER): Payer: BC Managed Care – PPO

## 2021-04-10 DIAGNOSIS — S86812A Strain of other muscle(s) and tendon(s) at lower leg level, left leg, initial encounter: Secondary | ICD-10-CM | POA: Diagnosis not present

## 2021-04-10 DIAGNOSIS — Z7901 Long term (current) use of anticoagulants: Secondary | ICD-10-CM | POA: Diagnosis not present

## 2021-04-10 DIAGNOSIS — I10 Essential (primary) hypertension: Secondary | ICD-10-CM | POA: Diagnosis not present

## 2021-04-10 DIAGNOSIS — R0789 Other chest pain: Secondary | ICD-10-CM

## 2021-04-10 DIAGNOSIS — R609 Edema, unspecified: Secondary | ICD-10-CM

## 2021-04-10 DIAGNOSIS — M79662 Pain in left lower leg: Secondary | ICD-10-CM | POA: Diagnosis present

## 2021-04-10 DIAGNOSIS — X58XXXA Exposure to other specified factors, initial encounter: Secondary | ICD-10-CM | POA: Insufficient documentation

## 2021-04-10 LAB — BASIC METABOLIC PANEL
Anion gap: 7 (ref 5–15)
BUN: 14 mg/dL (ref 6–20)
CO2: 28 mmol/L (ref 22–32)
Calcium: 9.5 mg/dL (ref 8.9–10.3)
Chloride: 102 mmol/L (ref 98–111)
Creatinine, Ser: 0.94 mg/dL (ref 0.44–1.00)
GFR, Estimated: >60 mL/min (ref >60)
Glucose, Bld: 118 mg/dL — ABNORMAL HIGH (ref 70–99)
Potassium: 3.7 mmol/L (ref 3.5–5.1)
Sodium: 137 mmol/L (ref 135–145)

## 2021-04-10 LAB — CBC
HCT: 36.4 % (ref 36.0–46.0)
Hemoglobin: 11.4 g/dL — ABNORMAL LOW (ref 12.0–15.0)
MCH: 25 pg — ABNORMAL LOW (ref 26.0–34.0)
MCHC: 31.3 g/dL (ref 30.0–36.0)
MCV: 79.8 fL — ABNORMAL LOW (ref 80.0–100.0)
Platelets: 309 10*3/uL (ref 150–400)
RBC: 4.56 MIL/uL (ref 3.87–5.11)
RDW: 18.6 % — ABNORMAL HIGH (ref 11.5–15.5)
WBC: 8.4 10*3/uL (ref 4.0–10.5)
nRBC: 0 % (ref 0.0–0.2)

## 2021-04-10 LAB — TROPONIN I (HIGH SENSITIVITY): Troponin I (High Sensitivity): <2 ng/L (ref <18)

## 2021-04-10 LAB — D-DIMER, QUANTITATIVE: D-Dimer, Quant: 0.31 ug/mL-FEU (ref 0.00–0.50)

## 2021-04-10 NOTE — ED Triage Notes (Signed)
Pt presents to the ED with left leg pain and swelling. Pt also reports CP. 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 Pt states that she is on 20mg  of xerelto. Pt denies SHOB. Pt A&Ox4 at time of triage. VSS. EKG obtained to show NSR.

## 2021-04-11 LAB — TROPONIN I (HIGH SENSITIVITY): Troponin I (High Sensitivity): <2 ng/L (ref <18)

## 2021-04-11 MED ORDER — TRAMADOL HCL 50 MG PO TABS: 50.0000 mg | ORAL_TABLET | ORAL | 0 refills | Status: DC | PRN

## 2021-04-11 MED ORDER — TRAMADOL HCL 50 MG PO TABS
50.0000 mg | ORAL_TABLET | Freq: Once | ORAL | Status: AC
  Administered 2021-04-11: 01:00:00 50 mg via ORAL
  Filled 2021-04-11: qty 1

## 2021-04-11 NOTE — Discharge Instructions (Signed)
Apply ice for 30 minutes at a time, 4 times a day.  Take acetaminophen as needed for pain.  Take tramadol as needed for pain not relieved by acetaminophen.  Please note that you should not take ibuprofen or naproxen while you are taking the blood thinner.

## 2021-04-11 NOTE — ED Provider Notes (Signed)
[facility] Provider Note   CSN: [id number] Arrival date & time: 04/10/21  2155     History Chief Complaint  Patient presents with   Chest Pain   Leg Pain    Felicia Pham is a 40 y.o. female.  The history is provided by the patient.  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  She started having pain in her proximal left calf and in her lower sternal area this afternoon.  Pain is dull and she rates both pains at 8/10.  Nothing makes them better, nothing makes them worse.  She denies any trauma or recent unusual activity.  She has been compliant with her rivaroxaban.  She is a non-smoker and denies history of diabetes or hypertension or hyperlipidemia.  There is no family history of premature coronary atherosclerosis.   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 ([place]) 09/05/2020   Lower abdominal pain 03/30/2018   Abnormal glucose 03/30/2018   Acute bronchitis 03/30/2018   Acute pharyngitis, unspecified 03/30/2018   Backache 03/30/2018   Chest pain 03/30/2018   Diarrhea, unspecified 03/30/2018   Dizziness and giddiness 03/30/2018   Generalized muscle weakness 03/30/2018   Goiter 03/30/2018   Malignant essential hypertension 03/30/2018   Mixed hyperlipidemia 03/30/2018   Body mass index 37.0-37.9, adult 03/30/2018   Nausea 03/30/2018   Other long term (current) drug therapy 03/30/2018   Other malaise and fatigue 03/30/2018   Pain in left leg 03/30/2018   Gastroesophageal reflux disease 06/09/2017   Mild dysplasia of cervix 01/05/2014   Papanicolaou smear of cervix with low grade  squamous intraepithelial lesion (LGSIL) 12/12/2013   Myalgia and myositis 05/26/2013   Pain in joint, shoulder region 05/26/2013   Other iron deficiency anemias 02/25/2013   Pelvic pain in female 11/17/2012   Other and unspecified ovarian cyst 11/02/2012   Leiomyoma of uterus, unspecified 08/05/2012   Impaired fasting glucose 01/31/2010   Need for prophylactic vaccination and inoculation against influenza 01/31/2010    Past Surgical History:  Procedure Laterality Date   MYOMECTOMY  2014     OB History     Gravida  0   Para  0   Term  0   Preterm  0   AB  0   Living  0      SAB  0   IAB  0   Ectopic  0   Multiple  0   Live Births              Family History  Problem Relation Age of Onset   Lupus Mother    Hypertension Mother    Hypertension Father    Diabetes Father    Heart attack Father 55   Diabetes Maternal Grandmother    Diabetes Paternal Grandmother     Social History   Tobacco Use   Smoking status: Never   Smokeless tobacco: Never  Vaping Use   Vaping Use: Never used  Substance Use Topics   Alcohol use: No   Drug use: No    Home Medications Prior to Admission medications   Medication Sig  Start Date End Date Taking? Authorizing Provider  acetaminophen (TYLENOL) 325 MG tablet Take 2 tablets (650 mg total) by mouth every 6 (six) hours as needed for headache or mild pain. 09/06/20   Allie Bossier, MD  cetirizine-pseudoephedrine (ZYRTEC-D) 5-120 MG tablet Take 1 tablet by mouth daily. 10/07/20   Faustino Congress, NP  fluticasone (CUTIVATE) 0.05 % cream Apply 1 application topically in the morning and at bedtime. 08/13/20   [provider]  fluticasone (FLONASE) 50 MCG/ACT nasal spray Place 2 sprays into both nostrils daily. 10/07/20   Faustino Congress, NP  hydrocortisone 2.5 % cream Apply 1 application topically daily as needed (rash, dry skin). 01/31/18   [provider]  ibuprofen (ADVIL,MOTRIN) 200 MG tablet Take  600-800 mg by mouth every 6 (six) hours as needed for moderate pain. Reported on 09/25/2015    [provider]  ketoconazole (NIZORAL) 2 % cream Apply 1 application topically 2 (two) times daily. 08/13/20   [provider]  ketoconazole (NIZORAL) 2 % shampoo Apply 1 application topically every 14 (fourteen) days. 08/13/20   [provider]  metroNIDAZOLE (FLAGYL) 500 MG tablet Take 1 tablet (500 mg total) by mouth 2 (two) times daily. 01/16/21   Shelly Bombard, MD  predniSONE (STERAPRED UNI-PAK 21 TAB) 10 MG (21) TBPK tablet Take by mouth daily. Take 6 tabs by mouth daily  for 2 days, then 5 tabs for 2 days, then 4 tabs for 2 days, then 3 tabs for 2 days, 2 tabs for 2 days, then 1 tab by mouth daily for 2 days 02/11/21   Hans Eden, NP  RIVAROXABAN Alveda Reasons) VTE STARTER PACK (15 & 20 MG) take $Remove'15mg'LIrccFg$  tablet twice daily for 21 days then decrease to $RemoveBef'20mg'iTaDNYPOpt$  tablet once daily 09/06/20   Allie Bossier, MD  rivaroxaban (XARELTO) 20 MG TABS tablet Take 1 tablet (20 mg total) by mouth daily with supper. 09/27/20   Allie Bossier, MD  rivaroxaban Alveda Reasons) KIT Educational material for Xarelto 09/06/20   Allie Bossier, MD  traMADol Veatrice Bourbon) 50 MG tablet Take by mouth as needed.    [provider]    Allergies    Patient has no known allergies.  Review of Systems   Review of Systems  Cardiovascular:  Positive for chest pain.  All other systems reviewed and are negative.  Physical Exam Updated Vital Signs BP 122/83 (BP Location: Right Arm)    Pulse 72    Temp 98.2 F (36.8 C)    Resp 16    Ht $R'5\' 4"'dS$  (1.626 m)    Wt 122.9 kg    LMP  (LMP Unknown)    SpO2 100%    BMI 46.52 kg/m   Physical Exam Vitals and nursing note reviewed.  40 year old female, resting comfortably and in no acute distress. Vital signs are normal. Oxygen saturation is 100%, which is normal. Head is normocephalic and atraumatic. PERRLA, EOMI. Oropharynx is clear. Neck is nontender and supple  without adenopathy or JVD. Back is nontender and there is no CVA tenderness. Lungs are clear without rales, wheezes, or rhonchi. Chest is tender over the lower sternal area which reproduces her pain.  There is no crepitus. Heart has regular rate and rhythm without murmur. Abdomen is soft, flat, nontender. Extremities have no cyanosis or edema, full range of motion is present.  There is mild tenderness palpation the proximal left calf, but there is no calf swelling.  Calf circumference is symmetric.  Bevelyn Buckles'  sign is negative. Skin is warm and dry without rash. Neurologic: Mental status is normal, cranial nerves are intact, moves all extremities equally.  ED Results / Procedures / Treatments   Labs (all labs ordered are listed, but only abnormal results are displayed) Labs Reviewed  BASIC METABOLIC PANEL - Abnormal; Notable for the following components:      Result Value   Glucose, Bld 118 (*)    All other components within normal limits  CBC - Abnormal; Notable for the following components:   Hemoglobin 11.4 (*)    MCV 79.8 (*)    MCH 25.0 (*)    RDW 18.6 (*)    All other components within normal limits  D-DIMER, QUANTITATIVE  PREGNANCY, URINE  TROPONIN I (HIGH SENSITIVITY)  TROPONIN I (HIGH SENSITIVITY)    EKG EKG Interpretation  Date/Time:  Wednesday April 10 2021 22:12:12 EST Ventricular Rate:  84 PR Interval:  136 QRS Duration: 88 QT Interval:  346 QTC Calculation: 408 R Axis:   55 Text Interpretation: Normal sinus rhythm Normal ECG When compared with ECG of 28-Sep-2020 17:43, No significant change was found Confirmed by Delora Fuel ([id number]) on 04/11/2021 12:29:34 AM  Radiology DG Chest 2 View  Result Date: 04/10/2021 CLINICAL DATA:  Chest pain. EXAM: CHEST - 2 VIEW COMPARISON:  Chest radiograph dated 09/28/2020 FINDINGS: The heart size and mediastinal contours are within normal limits. Both lungs are clear. The visualized skeletal structures are unremarkable.  IMPRESSION: No active cardiopulmonary disease. Electronically Signed   By: Anner Crete M.D.   On: 04/10/2021 22:30   US Venous Img Lower Unilateral Left (DVT)  Result Date: 04/10/2021 CLINICAL DATA:  Left lower extremity pain and swelling. Previous history of pulmonary embolus. Anticoagulation therapy. EXAM: Left LOWER EXTREMITY VENOUS DOPPLER ULTRASOUND TECHNIQUE: Gray-scale sonography with compression, as well as color and duplex ultrasound, were performed to evaluate the deep venous system(s) from the level of the common femoral vein through the popliteal and proximal calf veins. COMPARISON:  None. FINDINGS: VENOUS Normal compressibility of the common femoral, superficial femoral, and popliteal veins, as well as the visualized calf veins. Visualized portions of profunda femoral vein and great saphenous vein unremarkable. No filling defects to suggest DVT on grayscale or color Doppler imaging. Doppler waveforms show normal direction of venous flow, normal respiratory plasticity and response to augmentation. Limited views of the contralateral common femoral vein are unremarkable. OTHER None. Limitations: none IMPRESSION: No evidence of deep venous thrombosis in the visualized lower extremity veins. Electronically Signed   By: Lucienne Capers M.D.   On: 04/10/2021 23:46    Procedures Procedures   Medications Ordered in ED Medications  traMADol (ULTRAM) tablet 50 mg (50 mg Oral Given 04/11/21 0056)    ED Course  I have reviewed the triage vital signs and the nursing notes.  Pertinent labs & imaging results that were available during my care of the patient were reviewed by me and considered in my medical decision making (see chart for details).   MDM Rules/Calculators/A&P                         Chest pain and calf pain which appears most likely to be musculoskeletal.  Old records are reviewed confirming hospitalization for DVT and pulmonary embolism last May.  She was supposed to be on  anticoagulation for 3-6 months before decision to be made regarding hypercoagulable work-up.  In the ED, ECG is normal and chest x-ray is normal.  Venous ultrasound is negative for DVT.  Labs show mild anemia which is stable, normal initial troponin with repeat troponin pending.  Repeat troponin is normal.  She is discharged with instructions to apply ice, use acetaminophen as needed for pain.  Given prescription for small number of tramadol to use as needed for pain not relieved with acetaminophen.  Follow-up with her PCP in 2 weeks as scheduled.  Final Clinical Impression(s) / ED Diagnoses Final diagnoses:  Strain of calf muscle, left, initial encounter  Chest wall pain    Rx / DC Orders ED Discharge Orders          Ordered    traMADol (ULTRAM) 50 MG tablet  As needed        04/11/21 9163             Delora Fuel, MD 84/66/59 [phone]

## 2021-05-31 ENCOUNTER — Other Ambulatory Visit: Payer: Self-pay

## 2021-05-31 ENCOUNTER — Emergency Department (HOSPITAL_BASED_OUTPATIENT_CLINIC_OR_DEPARTMENT_OTHER)
Admission: EM | Admit: 2021-05-31 | Discharge: 2021-06-01 | Disposition: A | Discharge status: Home / Self Care | Payer: BC Managed Care – PPO | Attending: Emergency Medicine | Admitting: Emergency Medicine

## 2021-05-31 ENCOUNTER — Encounter (HOSPITAL_BASED_OUTPATIENT_CLINIC_OR_DEPARTMENT_OTHER): Payer: Self-pay | Admitting: *Deleted

## 2021-05-31 ENCOUNTER — Emergency Department (HOSPITAL_BASED_OUTPATIENT_CLINIC_OR_DEPARTMENT_OTHER): Payer: BC Managed Care – PPO | Admitting: Radiology

## 2021-05-31 DIAGNOSIS — Z7951 Long term (current) use of inhaled steroids: Secondary | ICD-10-CM | POA: Diagnosis not present

## 2021-05-31 DIAGNOSIS — Z7901 Long term (current) use of anticoagulants: Secondary | ICD-10-CM | POA: Diagnosis not present

## 2021-05-31 DIAGNOSIS — R072 Precordial pain: Secondary | ICD-10-CM

## 2021-05-31 LAB — CBC
HCT: 37.9 % (ref 36.0–46.0)
Hemoglobin: 12.2 g/dL (ref 12.0–15.0)
MCH: 26.7 pg (ref 26.0–34.0)
MCHC: 32.2 g/dL (ref 30.0–36.0)
MCV: 82.9 fL (ref 80.0–100.0)
Platelets: 316 10*3/uL (ref 150–400)
RBC: 4.57 MIL/uL (ref 3.87–5.11)
RDW: 15.5 % (ref 11.5–15.5)
WBC: 7.2 10*3/uL (ref 4.0–10.5)
nRBC: 0 % (ref 0.0–0.2)

## 2021-05-31 LAB — PREGNANCY, URINE: Preg Test, Ur: NEGATIVE

## 2021-05-31 LAB — BASIC METABOLIC PANEL
Anion gap: 9 (ref 5–15)
BUN: 15 mg/dL (ref 6–20)
CO2: 27 mmol/L (ref 22–32)
Calcium: 9.6 mg/dL (ref 8.9–10.3)
Chloride: 101 mmol/L (ref 98–111)
Creatinine, Ser: 0.81 mg/dL (ref 0.44–1.00)
GFR, Estimated: >60 mL/min (ref >60)
Glucose, Bld: 91 mg/dL (ref 70–99)
Potassium: 3.7 mmol/L (ref 3.5–5.1)
Sodium: 137 mmol/L (ref 135–145)

## 2021-05-31 LAB — D-DIMER, QUANTITATIVE: D-Dimer, Quant: 0.35 ug/mL-FEU (ref 0.00–0.50)

## 2021-05-31 LAB — TROPONIN I (HIGH SENSITIVITY)
Troponin I (High Sensitivity): 2 ng/L (ref <18)
Troponin I (High Sensitivity): 3 ng/L (ref <18)

## 2021-05-31 MED ORDER — ALUM & MAG HYDROXIDE-SIMETH 200-200-20 MG/5ML PO SUSP
30.0000 mL | Freq: Once | ORAL | Status: AC
  Administered 2021-05-31: 30 mL via ORAL
  Filled 2021-05-31: qty 30

## 2021-05-31 MED ORDER — KETOROLAC TROMETHAMINE 30 MG/ML IJ SOLN
15.0000 mg | Freq: Once | INTRAMUSCULAR | Status: AC
Start: 2021-05-31 — End: 2021-05-31
  Administered 2021-05-31: 15 mg via INTRAVENOUS
  Filled 2021-05-31: qty 1

## 2021-05-31 MED ORDER — LIDOCAINE 5 % EX PTCH
1.0000 | MEDICATED_PATCH | CUTANEOUS | Status: DC
  Administered 2021-05-31: 1 via TRANSDERMAL
  Filled 2021-05-31: qty 1

## 2021-05-31 NOTE — ED Triage Notes (Signed)
Pt is here for CP in central chest with pain radiation to right chest and right arm.  No sob with this.  Pt had PE last year and was on xarelto until last month.  Pt states that this feels similar to when she had a PE.

## 2021-06-01 ENCOUNTER — Encounter (HOSPITAL_BASED_OUTPATIENT_CLINIC_OR_DEPARTMENT_OTHER): Payer: Self-pay | Admitting: Emergency Medicine

## 2021-06-01 ENCOUNTER — Emergency Department (HOSPITAL_BASED_OUTPATIENT_CLINIC_OR_DEPARTMENT_OTHER): Payer: BC Managed Care – PPO

## 2021-06-01 MED ORDER — IOHEXOL 350 MG/ML SOLN
100.0000 mL | Freq: Once | INTRAVENOUS | Status: AC | PRN
  Administered 2021-06-01: 66 mL via INTRAVENOUS

## 2021-06-01 MED ORDER — IBUPROFEN 800 MG PO TABS: 800.0000 mg | ORAL_TABLET | Freq: Three times a day (TID) | ORAL | 0 refills | Status: DC

## 2021-06-01 MED ORDER — LIDOCAINE 5 % EX PTCH: 1.0000 | MEDICATED_PATCH | CUTANEOUS | 0 refills | Status: DC

## 2021-06-01 NOTE — ED Provider Notes (Signed)
[facility] DEPT Provider Note   CSN: [id number] Arrival date & time: 05/31/21  2027     History  Chief Complaint  Patient presents with   Chest Pain    Felicia Pham is a 41 y.o. female.  The history is provided by the patient.  Chest Pain Pain location:  Substernal area Pain quality: dull   Radiates to: right upper arm. Pain severity:  Moderate Onset quality:  Gradual Duration:  3 days Timing:  Constant Progression:  Unchanged Chronicity:  New Context: not breathing, not drug use and not eating   Relieved by:  Nothing Worsened by:  Nothing Ineffective treatments:  None tried Associated symptoms: no abdominal pain, no back pain, no claudication, no cough, no fever, no headache, no heartburn, no lower extremity edema, no palpitations, no shortness of breath and no syncope   Risk factors: prior DVT/PE   Risk factors: no aortic disease   Patient with prior PE presents with SCCP that is dull and worse with movement and palpation.  No leg pain.  No leg swelling no estrogen.      Home Medications Prior to Admission medications   Medication Sig Start Date End Date Taking? Authorizing Provider  ibuprofen (ADVIL) 800 MG tablet Take 1 tablet (800 mg total) by mouth 3 (three) times daily. 06/01/21  Yes Palumbo, April, MD  lidocaine (LIDODERM) 5 % Place 1 patch onto the skin daily. Remove & Discard patch within 12 hours or as directed by MD 06/01/21  Yes Palumbo, April, MD  acetaminophen (TYLENOL) 325 MG tablet Take 2 tablets (650 mg total) by mouth every 6 (six) hours as needed for headache or mild pain. 09/06/20   Allie Bossier, MD  cetirizine-pseudoephedrine (ZYRTEC-D) 5-120 MG tablet Take 1 tablet by mouth daily. 10/07/20   Faustino Congress, NP  fluticasone (CUTIVATE) 0.05 % cream Apply 1 application topically in the morning and at bedtime. 08/13/20   [provider]  fluticasone (FLONASE) 50 MCG/ACT nasal spray Place 2 sprays into both  nostrils daily. 10/07/20   Faustino Congress, NP  hydrocortisone 2.5 % cream Apply 1 application topically daily as needed (rash, dry skin). 01/31/18   [provider]  ketoconazole (NIZORAL) 2 % cream Apply 1 application topically 2 (two) times daily. 08/13/20   [provider]  ketoconazole (NIZORAL) 2 % shampoo Apply 1 application topically every 14 (fourteen) days. 08/13/20   [provider]  rivaroxaban (XARELTO) 20 MG TABS tablet Take 1 tablet (20 mg total) by mouth daily with supper. 09/27/20   Allie Bossier, MD  rivaroxaban [employer]) KIT Educational material for Xarelto 09/06/20   Allie Bossier, MD  traMADol (ULTRAM) 50 MG tablet Take 1 tablet (50 mg total) by mouth as needed. 78/24/23   Delora Fuel, MD      Allergies    Patient has no known allergies.    Review of Systems   Review of Systems  Constitutional:  Negative for fever.  HENT:  Negative for facial swelling.   Eyes:  Negative for redness.  Respiratory:  Negative for cough and shortness of breath.   Cardiovascular:  Positive for chest pain. Negative for palpitations, claudication and syncope.  Gastrointestinal:  Negative for abdominal pain and heartburn.  Genitourinary:  Negative for difficulty urinating.  Musculoskeletal:  Negative for back pain.  Neurological:  Negative for headaches.  Psychiatric/Behavioral:  Negative for agitation.   All other systems reviewed and are negative.  Physical Exam Updated Vital Signs BP  119/87 (BP Location: Right Arm)    Pulse (!) 58    Temp 97.6 F (36.4 C) (Temporal)    Resp 10    LMP  (LMP Unknown)    SpO2 100%  Physical Exam Vitals and nursing note reviewed.  Constitutional:      General: She is not in acute distress.    Appearance: Normal appearance.  HENT:     Head: Normocephalic and atraumatic.     Nose: Nose normal.  Eyes:     Conjunctiva/sclera: Conjunctivae normal.     Pupils: Pupils are equal, round, and reactive to light.  Cardiovascular:      Rate and Rhythm: Normal rate and regular rhythm.     Pulses: Normal pulses.     Heart sounds: Normal heart sounds.  Pulmonary:     Effort: Pulmonary effort is normal. No respiratory distress.     Breath sounds: Normal breath sounds. No wheezing or rales.  Chest:     Chest wall: Tenderness present.  Abdominal:     General: Bowel sounds are normal.     Palpations: Abdomen is soft.     Tenderness: There is no abdominal tenderness. There is no guarding.  Musculoskeletal:        General: No tenderness. Normal range of motion.     Cervical back: Normal range of motion and neck supple.     Right lower leg: No edema.     Left lower leg: No edema.  Skin:    General: Skin is warm and dry.     Capillary Refill: Capillary refill takes less than 2 seconds.  Neurological:     General: No focal deficit present.     Mental Status: She is alert and oriented to person, place, and time.     Deep Tendon Reflexes: Reflexes normal.  Psychiatric:        Mood and Affect: Mood normal.        Behavior: Behavior normal.    ED Results / Procedures / Treatments   Labs (all labs ordered are listed, but only abnormal results are displayed) Results for orders placed or performed during the hospital encounter of 90/24/09  Basic metabolic panel  Result Value Ref Range   Sodium 137 135 - 145 mmol/L   Potassium 3.7 3.5 - 5.1 mmol/L   Chloride 101 98 - 111 mmol/L   CO2 27 22 - 32 mmol/L   Glucose, Bld 91 70 - 99 mg/dL   BUN 15 6 - 20 mg/dL   Creatinine, Ser 0.81 0.44 - 1.00 mg/dL   Calcium 9.6 8.9 - 10.3 mg/dL   GFR, Estimated >60 >60 mL/min   Anion gap 9 5 - 15  CBC  Result Value Ref Range   WBC 7.2 4.0 - 10.5 K/uL   RBC 4.57 3.87 - 5.11 MIL/uL   Hemoglobin 12.2 12.0 - 15.0 g/dL   HCT 37.9 36.0 - 46.0 %   MCV 82.9 80.0 - 100.0 fL   MCH 26.7 26.0 - 34.0 pg   MCHC 32.2 30.0 - 36.0 g/dL   RDW 15.5 11.5 - 15.5 %   Platelets 316 150 - 400 K/uL   nRBC 0.0 0.0 - 0.2 %  Pregnancy, urine  Result  Value Ref Range   Preg Test, Ur NEGATIVE NEGATIVE  D-dimer, quantitative  Result Value Ref Range   D-Dimer, Quant 0.35 0.00 - 0.50 ug/mL-FEU  Troponin I (High Sensitivity)  Result Value Ref Range   Troponin I (High Sensitivity) 2 <18 ng/L  Troponin I (High Sensitivity)  Result Value Ref Range   Troponin I (High Sensitivity) 3 <18 ng/L   DG Chest 2 View  Result Date: 05/31/2021 CLINICAL DATA:  Chest pain on the right, initial encounter EXAM: CHEST - 2 VIEW COMPARISON:  04/10/2021 FINDINGS: The heart size and mediastinal contours are within normal limits. Both lungs are clear. The visualized skeletal structures are unremarkable. IMPRESSION: No active cardiopulmonary disease. Electronically Signed   By: Inez Catalina M.D.   On: 05/31/2021 21:22   CT Angio Chest PE W and/or Wo Contrast  Result Date: 06/01/2021 CLINICAL DATA:  Chest pain and shortness of breath EXAM: CT ANGIOGRAPHY CHEST WITH CONTRAST TECHNIQUE: Multidetector CT imaging of the chest was performed using the standard protocol during bolus administration of intravenous contrast. Multiplanar CT image reconstructions and MIPs were obtained to evaluate the vascular anatomy. RADIATION DOSE REDUCTION: This exam was performed according to the departmental dose-optimization program which includes automated exposure control, adjustment of the mA and/or kV according to patient size and/or use of iterative reconstruction technique. CONTRAST:  57m OMNIPAQUE IOHEXOL 350 MG/ML SOLN COMPARISON:  Chest x-ray from the previous day. FINDINGS: Cardiovascular: Thoracic aorta shows no aneurysmal dilatation or dissection. Heart is at the upper limits of normal in size. Pulmonary artery shows a normal branching pattern bilaterally. No filling defect to suggest pulmonary embolism is seen. Mediastinum/Nodes: Thoracic inlet is within normal limits. No sizable hilar or mediastinal adenopathy is noted. The esophagus as visualized is within normal limits.  Lungs/Pleura: Lungs are well aerated bilaterally. No focal infiltrate or sizable effusion is seen. No parenchymal nodules are noted. Upper Abdomen: Visualized upper abdomen shows no acute abnormality. Musculoskeletal: No chest wall abnormality. No acute or significant osseous findings. Review of the MIP images confirms the above findings. IMPRESSION: No evidence of pulmonary emboli. No acute abnormality noted. Electronically Signed   By: MInez CatalinaM.D.   On: 06/01/2021 00:36     EKG EKG Interpretation  Date/Time:  Friday May 31 2021 20:35:10 EST Ventricular Rate:  81 PR Interval:  126 QRS Duration: 80 QT Interval:  360 QTC Calculation: 418 R Axis:   27 Text Interpretation: Normal sinus rhythm Confirmed by PRandal Buba April ([id number]) on 05/31/2021 11:03:52 PM  Radiology DG Chest 2 View  Result Date: 05/31/2021 CLINICAL DATA:  Chest pain on the right, initial encounter EXAM: CHEST - 2 VIEW COMPARISON:  04/10/2021 FINDINGS: The heart size and mediastinal contours are within normal limits. Both lungs are clear. The visualized skeletal structures are unremarkable. IMPRESSION: No active cardiopulmonary disease. Electronically Signed   By: MInez CatalinaM.D.   On: 05/31/2021 21:22   CT Angio Chest PE W and/or Wo Contrast  Result Date: 06/01/2021 CLINICAL DATA:  Chest pain and shortness of breath EXAM: CT ANGIOGRAPHY CHEST WITH CONTRAST TECHNIQUE: Multidetector CT imaging of the chest was performed using the standard protocol during bolus administration of intravenous contrast. Multiplanar CT image reconstructions and MIPs were obtained to evaluate the vascular anatomy. RADIATION DOSE REDUCTION: This exam was performed according to the departmental dose-optimization program which includes automated exposure control, adjustment of the mA and/or kV according to patient size and/or use of iterative reconstruction technique. CONTRAST:  675mOMNIPAQUE IOHEXOL 350 MG/ML SOLN COMPARISON:  Chest x-ray from  the previous day. FINDINGS: Cardiovascular: Thoracic aorta shows no aneurysmal dilatation or dissection. Heart is at the upper limits of normal in size. Pulmonary artery shows a normal branching pattern bilaterally. No filling defect to suggest pulmonary embolism is seen.  Mediastinum/Nodes: Thoracic inlet is within normal limits. No sizable hilar or mediastinal adenopathy is noted. The esophagus as visualized is within normal limits. Lungs/Pleura: Lungs are well aerated bilaterally. No focal infiltrate or sizable effusion is seen. No parenchymal nodules are noted. Upper Abdomen: Visualized upper abdomen shows no acute abnormality. Musculoskeletal: No chest wall abnormality. No acute or significant osseous findings. Review of the MIP images confirms the above findings. IMPRESSION: No evidence of pulmonary emboli. No acute abnormality noted. Electronically Signed   By: Inez Catalina M.D.   On: 06/01/2021 00:36    Procedures Procedures    Medications Ordered in ED Medications  lidocaine (LIDODERM) 5 % 1 patch (1 patch Transdermal Patch Applied 05/31/21 2345)  ketorolac (TORADOL) 30 MG/ML injection 15 mg (15 mg Intravenous Given 05/31/21 2345)  alum & mag hydroxide-simeth (MAALOX/MYLANTA) 200-200-20 MG/5ML suspension 30 mL (30 mLs Oral Given 05/31/21 2345)  iohexol (OMNIPAQUE) 350 MG/ML injection 100 mL (66 mLs Intravenous Contrast Given 06/01/21 0015)    ED Course/ Medical Decision Making/ A&P                           Medical Decision Making SSCP with arm pain, worse with palpitation and movement.    Amount and/or Complexity of Data Reviewed Labs: ordered.    Details: labs reviewed personally, 2 negative troponins, negative ddimer, normal electrolytes Radiology: ordered.    Details: reviewed by me personally CXR is normal and CTA for PE is also normal without PE ECG/medicine tests: ordered and independent interpretation performed. Decision-making details documented in ED Course.  Risk OTC  drugs. Prescription drug management. Decision regarding hospitalization. Risk Details: I considered admission in this patient given symptoms but exam, vital labs and imaging are all benign and reassuring.  Without SOB or DOE or any exertional component in addition to reproducible pain pain is highly atypical for ACS.  HEART score is 1 low risk for MACE.  Ruled out for PE with a negative ddimer and negative CTA.  Stable fo discharge with close follow up    EKG Interpretation  Date/Time:  Friday May 31 2021 20:35:10 EST Ventricular Rate:  81 PR Interval:  126 QRS Duration: 80 QT Interval:  360 QTC Calculation: 418 R Axis:   27 Text Interpretation: Normal sinus rhythm Confirmed by Randal Buba, April ([id number]) on 05/31/2021 11:03:52 PM         Final Clinical Impression(s) / ED Diagnoses Final diagnoses:  Precordial pain   Return for intractable cough, coughing up blood, fevers > 100.4 unrelieved by medication, shortness of breath, intractable vomiting, chest pain, shortness of breath, weakness, numbness, changes in speech, facial asymmetry, abdominal pain, passing out, Inability to tolerate liquids or food, cough, altered mental status or any concerns. No signs of systemic illness or infection. The patient is nontoxic-appearing on exam and vital signs are within normal limits.  I have reviewed the triage vital signs and the nursing notes. Pertinent labs & imaging results that were available during my care of the patient were reviewed by me and considered in my medical decision making (see chart for details). After history, exam, and medical workup I feel the patient has been appropriately medically screened and is safe for discharge home. Pertinent diagnoses were discussed with the patient. Patient was given return precautions.   Rx / DC Orders ED Discharge Orders          Ordered    ibuprofen (ADVIL) 800 MG tablet  3 times daily  06/01/21 0046    lidocaine (LIDODERM) 5 %  Every 24  hours        06/01/21 0046              Trica Usery, MD 06/01/21 [id number]

## 2021-06-04 ENCOUNTER — Encounter: Payer: Self-pay | Admitting: Obstetrics

## 2021-06-04 ENCOUNTER — Other Ambulatory Visit: Payer: Self-pay

## 2021-06-04 ENCOUNTER — Ambulatory Visit (INDEPENDENT_AMBULATORY_CARE_PROVIDER_SITE_OTHER): Payer: BC Managed Care – PPO | Admitting: Obstetrics

## 2021-06-04 ENCOUNTER — Other Ambulatory Visit (HOSPITAL_COMMUNITY)
Admission: RE | Admit: 2021-06-04 | Discharge: 2021-06-04 | Disposition: A | Discharge status: Home / Self Care | Payer: BC Managed Care – PPO | Source: Ambulatory Visit | Attending: Obstetrics | Admitting: Obstetrics

## 2021-06-04 VITALS — BP 124/89 | HR 83 | Ht 64.0 in | Wt 272.0 lb

## 2021-06-04 DIAGNOSIS — N898 Other specified noninflammatory disorders of vagina: Secondary | ICD-10-CM

## 2021-06-04 DIAGNOSIS — Z01419 Encounter for gynecological examination (general) (routine) without abnormal findings: Secondary | ICD-10-CM | POA: Insufficient documentation

## 2021-06-04 DIAGNOSIS — Z6841 Body Mass Index (BMI) 40.0 and over, adult: Secondary | ICD-10-CM | POA: Diagnosis not present

## 2021-06-04 DIAGNOSIS — Z86711 Personal history of pulmonary embolism: Secondary | ICD-10-CM | POA: Insufficient documentation

## 2021-06-04 DIAGNOSIS — N760 Acute vaginitis: Secondary | ICD-10-CM | POA: Insufficient documentation

## 2021-06-04 DIAGNOSIS — Z113 Encounter for screening for infections with a predominantly sexual mode of transmission: Secondary | ICD-10-CM | POA: Insufficient documentation

## 2021-06-04 DIAGNOSIS — Z86718 Personal history of other venous thrombosis and embolism: Secondary | ICD-10-CM

## 2021-06-04 DIAGNOSIS — Z8759 Personal history of other complications of pregnancy, childbirth and the puerperium: Secondary | ICD-10-CM

## 2021-06-04 DIAGNOSIS — E669 Obesity, unspecified: Secondary | ICD-10-CM | POA: Diagnosis not present

## 2021-06-04 NOTE — Progress Notes (Signed)
PHQ2 - score 1.  GAD 7- score 0.  Pt has had Lupron through [facility] - last given Nov 2022.

## 2021-06-04 NOTE — Progress Notes (Signed)
Subjective:        Felicia Pham is a 41 y.o. female here for a routine exam.  Current complaints: Vaginal discharge.    Personal health questionnaire:  Is patient [name], have a family history of breast and/or ovarian cancer: no Is there a family history of uterine cancer diagnosed at age < 56, gastrointestinal cancer, urinary tract cancer, family member who is a Field seismologist syndrome-associated carrier: no Is the patient overweight and hypertensive, family history of diabetes, personal history of gestational diabetes, preeclampsia or PCOS: no Is patient over 2, have PCOS,  family history of premature CHD under age 64, diabetes, smoke, have hypertension or peripheral artery disease:  no At any time, has a partner hit, kicked or otherwise hurt or frightened you?: no Over the past 2 weeks, have you felt down, depressed or hopeless?: no Over the past 2 weeks, have you felt little interest or pleasure in doing things?:no   Gynecologic History No LMP recorded (lmp unknown). Patient has had an injection. Contraception: none.  Has been taking Lupron for fibroids.  Last dose in Nov. 2022 Last Pap: 05-07-2020.   Results were: ASCUS with negative HRHPV. Last mammogram: n/a. Results were: n/a  Obstetric History OB History  Gravida Para Term Preterm AB Living  0 0 0 0 0 0  SAB IAB Ectopic Multiple Live Births  0 0 0 0      Past Medical History:  Diagnosis Date   DVT (deep venous thrombosis) (HCC)    PE (pulmonary thromboembolism) ([place])    Rib fracture    Uterine fibroid     Past Surgical History:  Procedure Laterality Date   MYOMECTOMY  2014     Current Outpatient Medications:    cetirizine-pseudoephedrine (ZYRTEC-D) 5-120 MG tablet, Take 1 tablet by mouth daily., Disp: 30 tablet, Rfl: 0   ibuprofen (ADVIL) 800 MG tablet, Take 1 tablet (800 mg total) by mouth 3 (three) times daily., Disp: 21 tablet, Rfl: 0   traMADol (ULTRAM) 50 MG tablet, Take 1 tablet (50 mg total) by  mouth as needed., Disp: 15 tablet, Rfl: 0   acetaminophen (TYLENOL) 325 MG tablet, Take 2 tablets (650 mg total) by mouth every 6 (six) hours as needed for headache or mild pain., Disp: 60 tablet, Rfl: 0   fluticasone (CUTIVATE) 0.05 % cream, Apply 1 application topically in the morning and at bedtime., Disp: , Rfl:    fluticasone (FLONASE) 50 MCG/ACT nasal spray, Place 2 sprays into both nostrils daily., Disp: 16 g, Rfl: 2   hydrocortisone 2.5 % cream, Apply 1 application topically daily as needed (rash, dry skin)., Disp: , Rfl: 2   ketoconazole (NIZORAL) 2 % cream, Apply 1 application topically 2 (two) times daily., Disp: , Rfl:    ketoconazole (NIZORAL) 2 % shampoo, Apply 1 application topically every 14 (fourteen) days., Disp: , Rfl:    lidocaine (LIDODERM) 5 %, Place 1 patch onto the skin daily. Remove & Discard patch within 12 hours or as directed by MD, Disp: 30 patch, Rfl: 0 No Known Allergies  Social History   Tobacco Use   Smoking status: Never   Smokeless tobacco: Never  Substance Use Topics   Alcohol use: No    Family History  Problem Relation Age of Onset   Lupus Mother    Hypertension Mother    Hypertension Father    Diabetes Father    Heart attack Father 34   Diabetes Maternal Grandmother    Diabetes Paternal Grandmother  Review of Systems  Constitutional: negative for fatigue and weight loss Respiratory: negative for cough and wheezing Cardiovascular: negative for chest pain, fatigue and palpitations Gastrointestinal: negative for abdominal pain and change in bowel habits Musculoskeletal:negative for myalgias Neurological: negative for gait problems and tremors Behavioral/Psych: negative for abusive relationship, depression Endocrine: negative for temperature intolerance    Genitourinary: positive for vaginal discharge.  negative for abnormal menstrual periods, genital lesions, hot flashes, sexual problems  Integument/breast: negative for breast lump,  breast tenderness, nipple discharge and skin lesion(s)    Objective:       BP 124/89    Pulse 83    Ht 5\' 4"  (1.626 m)    Wt 272 lb (123.4 kg)    LMP  (LMP Unknown) Comment: Lupron   BMI 46.69 kg/m  General:   Alert and no distress  Skin:   no rash or abnormalities  Lungs:   clear to auscultation bilaterally  Heart:   regular rate and rhythm, S1, S2 normal, no murmur, click, rub or gallop  Breasts:   normal without suspicious masses, skin or nipple changes or axillary nodes  Abdomen:  normal findings: no organomegaly, soft, non-tender and no hernia  Pelvis:  External genitalia: normal general appearance Urinary system: urethral meatus normal and bladder without fullness, nontender Vaginal: normal without tenderness, induration or masses Cervix: normal appearance Adnexa: normal bimanual exam Uterus: anteverted and non-tender, normal size   Lab Review Urine pregnancy test Labs reviewed yes Radiologic studies reviewed yes  I have spent a total of 20 minutes of face-to-face time, excluding clinical staff time, reviewing notes and preparing to see patient, ordering tests and/or medications, and counseling the patient.   Assessment:    1. Encounter for routine gynecological examination with Papanicolaou smear of cervix R x: - Cytology - PAP( [place])  2. Vaginal discharge Rx: - Cervicovaginal ancillary only( [facility])  3. Screening for STD (sexually transmitted disease) Rx: - HIV Antibody (routine testing w rflx) - Hepatitis B surface antigen - RPR - Hepatitis C antibody  4. Class 3 severe obesity due to excess calories without serious comorbidity with body mass index (BMI) of 45.0 to 49.9 in adult (HCC) - weight reduction with the aid of dietary changes, exercise and behavioral modification recommended  5. History of maternal deep vein thrombosis (DVT) - hormonal contraception contraindicated  6. History of pulmonary embolism - hormonal contraception  recommended     Plan:    Education reviewed: calcium supplements, depression evaluation, low fat, low cholesterol diet, and weight bearing exercise. Contraception: none. Follow up in: 1 year.    Orders Placed This Encounter  Procedures   HIV Antibody (routine testing w rflx)   Hepatitis B surface antigen   RPR   Hepatitis C antibody     Shelly Bombard, MD 06/04/2021 10:20 AM

## 2021-06-05 LAB — HIV ANTIBODY (ROUTINE TESTING W REFLEX): HIV Screen 4th Generation wRfx: NONREACTIVE

## 2021-06-05 LAB — CERVICOVAGINAL ANCILLARY ONLY
Bacterial Vaginitis (gardnerella): POSITIVE — AB
Candida Glabrata: NEGATIVE
Candida Vaginitis: NEGATIVE
Chlamydia: NEGATIVE
Comment: NEGATIVE
Comment: NEGATIVE
Comment: NEGATIVE
Comment: NEGATIVE
Comment: NEGATIVE
Comment: NORMAL
Neisseria Gonorrhea: NEGATIVE
Trichomonas: NEGATIVE

## 2021-06-05 LAB — HEPATITIS B SURFACE ANTIGEN: Hepatitis B Surface Ag: NEGATIVE

## 2021-06-05 LAB — RPR: RPR Ser Ql: NONREACTIVE

## 2021-06-05 LAB — HEPATITIS C ANTIBODY: Hep C Virus Ab: NONREACTIVE

## 2021-06-06 ENCOUNTER — Encounter: Payer: Self-pay | Admitting: *Deleted

## 2021-06-06 ENCOUNTER — Other Ambulatory Visit: Payer: Self-pay | Admitting: Obstetrics

## 2021-06-06 DIAGNOSIS — B9689 Other specified bacterial agents as the cause of diseases classified elsewhere: Secondary | ICD-10-CM

## 2021-06-06 DIAGNOSIS — N760 Acute vaginitis: Secondary | ICD-10-CM

## 2021-06-06 MED ORDER — METRONIDAZOLE 500 MG PO TABS: 500.0000 mg | ORAL_TABLET | Freq: Two times a day (BID) | ORAL | 2 refills | Status: DC

## 2021-06-06 NOTE — Progress Notes (Signed)
MyChart message sent regarding BV and RX Flagyl. Education on BV included in message.

## 2021-06-10 LAB — CYTOLOGY - PAP
Comment: NEGATIVE
Comment: NEGATIVE
HPV 16: NEGATIVE
HPV 18 / 45: NEGATIVE
High risk HPV: POSITIVE — AB

## 2021-07-10 ENCOUNTER — Other Ambulatory Visit (HOSPITAL_COMMUNITY)
Admission: RE | Admit: 2021-07-10 | Discharge: 2021-07-10 | Disposition: A | Discharge status: Home / Self Care | Payer: BC Managed Care – PPO | Source: Ambulatory Visit | Attending: Obstetrics | Admitting: Obstetrics

## 2021-07-10 ENCOUNTER — Ambulatory Visit (INDEPENDENT_AMBULATORY_CARE_PROVIDER_SITE_OTHER): Payer: BC Managed Care – PPO | Admitting: Obstetrics

## 2021-07-10 ENCOUNTER — Encounter: Payer: Self-pay | Admitting: Obstetrics

## 2021-07-10 VITALS — BP 133/86 | HR 80 | Ht 64.0 in | Wt 273.7 lb

## 2021-07-10 DIAGNOSIS — R87612 Low grade squamous intraepithelial lesion on cytologic smear of cervix (LGSIL): Secondary | ICD-10-CM | POA: Diagnosis present

## 2021-07-10 NOTE — Progress Notes (Signed)
Colposcopy Procedure Note ? 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 The prior pap showed ASCUS with NEGATIVE high risk HPV.  Prior cervical/vaginal disease: normal exam without visible pathology. Prior cervical treatment:  unknown . ? ?Procedure Details  ?The risks and benefits of the procedure and Written informed consent obtained.  A time-out was performed confirming the patient, procedure and allergy status ? ?Speculum placed in vagina and excellent visualization of cervix achieved, cervix swabbed x 3 with acetic acid solution. ? ?Findings: ?Cervix: no visible lesions, no mosaicism, no punctation, and no abnormal vasculature; SCJ visualized 360 degrees without lesions, endocervical curettage performed, cervical biopsies taken at 3, 9, and 12 o'clock, specimen labelled and sent to pathology, and hemostasis achieved with Monsel's solution.   ?Vaginal inspection: normal without visible lesions. ?Vulvar colposcopy: vulvar colposcopy not performed. ?  ?Physical Exam ?  ?Specimens: ECC and Cervical  ? ?Complications: none. ? ?Plan: ?Specimens labelled and sent to Pathology. ?Will base further treatment on Pathology findings. ?Treatment options discussed with patient. ?Post biopsy instructions given to patient. ?Return to discuss Pathology results in 2 weeks.  ? ?Shelly Bombard, MD ?07/10/2021 5:14 PM  ?

## 2021-07-18 LAB — SURGICAL PATHOLOGY

## 2021-07-25 ENCOUNTER — Encounter: Payer: Self-pay | Admitting: Obstetrics

## 2021-07-25 ENCOUNTER — Telehealth (INDEPENDENT_AMBULATORY_CARE_PROVIDER_SITE_OTHER): Payer: BC Managed Care – PPO | Admitting: Obstetrics

## 2021-07-25 DIAGNOSIS — R877 Abnormal histological findings in specimens from female genital organs: Secondary | ICD-10-CM | POA: Diagnosis not present

## 2021-07-25 NOTE — Progress Notes (Signed)
? ? ?  GYNECOLOGY VIRTUAL VISIT ENCOUNTER NOTE ? ?Provider location: [facility] [place]  ? ?Patient location: Home ? ?I connected with Felicia Pham on 07/25/21 at  1:30 PM EDT by MyChart Video Encounter and verified that I am speaking with the correct person using two identifiers. ?  ?I discussed the limitations, risks, security and privacy concerns of performing an evaluation and management service virtually and the availability of in person appointments. I also discussed with the patient that there may be a patient responsible charge related to this service. The patient expressed understanding and agreed to proceed. ?  ?History:  ?Felicia Pham is a 41 y.o. G0P0000 female being evaluated today for colposcopy results.  She has a history of LGSIL pap smear.. She denies any abnormal vaginal discharge, bleeding, pelvic pain or other concerns.   ?  ?  ?Past Medical History:  ?Diagnosis Date  ? DVT (deep venous thrombosis) ([place])   ? PE (pulmonary thromboembolism) ([place])   ? Rib fracture   ? Uterine fibroid   ? ?Past Surgical History:  ?Procedure Laterality Date  ? MYOMECTOMY  2014  ? ?The following portions of the patient's history were reviewed and updated as appropriate: allergies, current medications, past family history, past medical history, past social history, past surgical history and problem list.  ? ?Health Maintenance:  LGSILpap and positive HRHPV on 06-04-2021.   ? ?Review of Systems:  ?Pertinent items noted in HPI and remainder of comprehensive ROS otherwise negative. ? ?Physical Exam:  ? ?General:  Alert, oriented and cooperative. Patient appears to be in no acute distress.  ?Mental Status: Normal mood and affect. Normal behavior. Normal judgment and thought content.   ?Respiratory: Normal respiratory effort, no problems with respiration noted  ?Rest of physical exam deferred due to type of encounter ? ?Labs and Imaging ?No results found for this or any previous visit (from the  past 336 hour(s)). ?No results found.   ?  ?Assessment and Plan:  ?   ?1. 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 ?  ?I discussed the assessment and treatment plan with the patient. The patient was provided an opportunity to ask questions and all were answered. The patient agreed with the plan and demonstrated an understanding of the instructions. ?  ?The patient was advised to call back or seek an in-person evaluation/go to the ED if the symptoms worsen or if the condition fails to improve as anticipated. ? ?I have spent a total of 10 minutes of non-face-to-face time, excluding clinical staff time, reviewing notes and preparing to see patient, ordering tests and/or medications, and counseling the patient.  ? ? ?Baltazar Najjar, MD ?[facility] [place], [place], [place] ?07/25/21  ?

## 2021-07-25 NOTE — Progress Notes (Signed)
Virtual Visit via Telephone Note ? ?I connected with Felicia Pham on 07/25/21 at  1:30 PM EDT by telephone and verified that I am speaking with the correct person using two identifiers. ? ?Location: ?Patient: Home ?Provider: Merla Riches  ? ?Discuss pathology results s/p colposcopy bx on 07/10/21 ?  ? ?

## 2021-07-31 ENCOUNTER — Ambulatory Visit (INDEPENDENT_AMBULATORY_CARE_PROVIDER_SITE_OTHER): Payer: BC Managed Care – PPO

## 2021-07-31 DIAGNOSIS — Z23 Encounter for immunization: Secondary | ICD-10-CM

## 2021-07-31 NOTE — Progress Notes (Signed)
Agree with nurses's documentation of this patient's clinic encounter.  Besan Ketchem L, MD  

## 2021-07-31 NOTE — Progress Notes (Signed)
Pt is in the office for 1st Gardasil injection. ?Administered in [place] and pt tolerated well. ?

## 2021-09-23 ENCOUNTER — Emergency Department (HOSPITAL_BASED_OUTPATIENT_CLINIC_OR_DEPARTMENT_OTHER): Payer: BC Managed Care – PPO

## 2021-09-23 ENCOUNTER — Observation Stay (HOSPITAL_BASED_OUTPATIENT_CLINIC_OR_DEPARTMENT_OTHER)
Admission: EM | Admit: 2021-09-23 | Discharge: 2021-09-24 | Disposition: A | Discharge status: Home / Self Care | Payer: BC Managed Care – PPO | Attending: Family Medicine | Admitting: Family Medicine

## 2021-09-23 ENCOUNTER — Other Ambulatory Visit: Payer: Self-pay

## 2021-09-23 ENCOUNTER — Encounter (HOSPITAL_BASED_OUTPATIENT_CLINIC_OR_DEPARTMENT_OTHER): Payer: Self-pay | Admitting: *Deleted

## 2021-09-23 ENCOUNTER — Emergency Department (HOSPITAL_BASED_OUTPATIENT_CLINIC_OR_DEPARTMENT_OTHER): Payer: BC Managed Care – PPO | Admitting: Radiology

## 2021-09-23 DIAGNOSIS — I10 Essential (primary) hypertension: Secondary | ICD-10-CM | POA: Diagnosis present

## 2021-09-23 DIAGNOSIS — R079 Chest pain, unspecified: Secondary | ICD-10-CM

## 2021-09-23 DIAGNOSIS — D259 Leiomyoma of uterus, unspecified: Secondary | ICD-10-CM | POA: Diagnosis present

## 2021-09-23 DIAGNOSIS — Z86711 Personal history of pulmonary embolism: Secondary | ICD-10-CM | POA: Insufficient documentation

## 2021-09-23 DIAGNOSIS — R0602 Shortness of breath: Secondary | ICD-10-CM | POA: Insufficient documentation

## 2021-09-23 DIAGNOSIS — R0789 Other chest pain: Principal | ICD-10-CM

## 2021-09-23 DIAGNOSIS — Z86718 Personal history of other venous thrombosis and embolism: Secondary | ICD-10-CM | POA: Diagnosis not present

## 2021-09-23 DIAGNOSIS — E782 Mixed hyperlipidemia: Secondary | ICD-10-CM | POA: Diagnosis present

## 2021-09-23 DIAGNOSIS — D508 Other iron deficiency anemias: Secondary | ICD-10-CM | POA: Diagnosis not present

## 2021-09-23 DIAGNOSIS — Z79899 Other long term (current) drug therapy: Secondary | ICD-10-CM | POA: Insufficient documentation

## 2021-09-23 LAB — TROPONIN I (HIGH SENSITIVITY)
Troponin I (High Sensitivity): <2 ng/L (ref <18)
Troponin I (High Sensitivity): <2 ng/L (ref <18)

## 2021-09-23 LAB — BASIC METABOLIC PANEL
Anion gap: 9 (ref 5–15)
BUN: 13 mg/dL (ref 6–20)
CO2: 23 mmol/L (ref 22–32)
Calcium: 9.3 mg/dL (ref 8.9–10.3)
Chloride: 104 mmol/L (ref 98–111)
Creatinine, Ser: 0.84 mg/dL (ref 0.44–1.00)
GFR, Estimated: >60 mL/min (ref >60)
Glucose, Bld: 99 mg/dL (ref 70–99)
Potassium: 3.7 mmol/L (ref 3.5–5.1)
Sodium: 136 mmol/L (ref 135–145)

## 2021-09-23 LAB — CBC
HCT: 34.9 % — ABNORMAL LOW (ref 36.0–46.0)
Hemoglobin: 11.5 g/dL — ABNORMAL LOW (ref 12.0–15.0)
MCH: 27.8 pg (ref 26.0–34.0)
MCHC: 33 g/dL (ref 30.0–36.0)
MCV: 84.3 fL (ref 80.0–100.0)
Platelets: 248 10*3/uL (ref 150–400)
RBC: 4.14 MIL/uL (ref 3.87–5.11)
RDW: 15.2 % (ref 11.5–15.5)
WBC: 7.8 10*3/uL (ref 4.0–10.5)
nRBC: 0 % (ref 0.0–0.2)

## 2021-09-23 LAB — D-DIMER, QUANTITATIVE: D-Dimer, Quant: 0.48 ug/mL-FEU (ref 0.00–0.50)

## 2021-09-23 LAB — HCG, SERUM, QUALITATIVE: Preg, Serum: NEGATIVE

## 2021-09-23 LAB — TSH: TSH: 4.353 u[IU]/mL (ref 0.350–4.500)

## 2021-09-23 LAB — BRAIN NATRIURETIC PEPTIDE: B Natriuretic Peptide: 11.8 pg/mL (ref 0.0–100.0)

## 2021-09-23 MED ORDER — NITROGLYCERIN 2 % TD OINT
0.5000 [in_us] | TOPICAL_OINTMENT | Freq: Once | TRANSDERMAL | Status: AC
  Administered 2021-09-23: 0.5 [in_us] via TOPICAL
  Filled 2021-09-23: qty 1

## 2021-09-23 MED ORDER — ACETAMINOPHEN 650 MG RE SUPP: 650.0000 mg | Freq: Four times a day (QID) | RECTAL | Status: DC | PRN

## 2021-09-23 MED ORDER — LIDOCAINE VISCOUS HCL 2 % MT SOLN
15.0000 mL | Freq: Once | OROMUCOSAL | Status: AC
  Administered 2021-09-23: 15 mL via ORAL
  Filled 2021-09-23: qty 15

## 2021-09-23 MED ORDER — KETOROLAC TROMETHAMINE 30 MG/ML IJ SOLN
30.0000 mg | Freq: Four times a day (QID) | INTRAMUSCULAR | Status: DC | PRN
  Administered 2021-09-23 – 2021-09-24 (×2): 30 mg via INTRAVENOUS
  Filled 2021-09-23 (×2): qty 1

## 2021-09-23 MED ORDER — CYCLOBENZAPRINE HCL 10 MG PO TABS: 5.0000 mg | ORAL_TABLET | Freq: Three times a day (TID) | ORAL | Status: DC | PRN

## 2021-09-23 MED ORDER — SODIUM CHLORIDE 0.9 % IV SOLN: 250.0000 mL | INTRAVENOUS | Status: DC | PRN

## 2021-09-23 MED ORDER — ACETAMINOPHEN 325 MG PO TABS
650.0000 mg | ORAL_TABLET | Freq: Four times a day (QID) | ORAL | Status: DC | PRN
  Administered 2021-09-24: 650 mg via ORAL
  Filled 2021-09-23: qty 2

## 2021-09-23 MED ORDER — ALUM & MAG HYDROXIDE-SIMETH 200-200-20 MG/5ML PO SUSP
30.0000 mL | Freq: Once | ORAL | Status: AC
  Administered 2021-09-23: 30 mL via ORAL
  Filled 2021-09-23: qty 30

## 2021-09-23 MED ORDER — IOHEXOL 350 MG/ML SOLN
100.0000 mL | Freq: Once | INTRAVENOUS | Status: AC | PRN
  Administered 2021-09-23: 100 mL via INTRAVENOUS

## 2021-09-23 MED ORDER — KETOROLAC TROMETHAMINE 15 MG/ML IJ SOLN
15.0000 mg | Freq: Once | INTRAMUSCULAR | Status: AC
  Administered 2021-09-23: 15 mg via INTRAVENOUS
  Filled 2021-09-23: qty 1

## 2021-09-23 MED ORDER — NITROGLYCERIN 0.4 MG SL SUBL
0.4000 mg | SUBLINGUAL_TABLET | SUBLINGUAL | Status: DC | PRN
Start: 2021-09-23 — End: 2021-09-25
  Administered 2021-09-23 (×2): 0.4 mg via SUBLINGUAL
  Filled 2021-09-23: qty 1

## 2021-09-23 MED ORDER — SODIUM CHLORIDE 0.9% FLUSH
3.0000 mL | Freq: Two times a day (BID) | INTRAVENOUS | Status: DC
  Administered 2021-09-23 – 2021-09-24 (×2): 3 mL via INTRAVENOUS

## 2021-09-23 MED ORDER — MORPHINE SULFATE (PF) 4 MG/ML IV SOLN
4.0000 mg | Freq: Once | INTRAVENOUS | Status: AC
  Administered 2021-09-23: 4 mg via INTRAVENOUS
  Filled 2021-09-23: qty 1

## 2021-09-23 MED ORDER — ASPIRIN 325 MG PO TABS
325.0000 mg | ORAL_TABLET | Freq: Every day | ORAL | Status: DC
  Administered 2021-09-23 – 2021-09-24 (×2): 325 mg via ORAL
  Filled 2021-09-23 (×2): qty 1

## 2021-09-23 MED ORDER — SODIUM CHLORIDE 0.9% FLUSH: 3.0000 mL | INTRAVENOUS | Status: DC | PRN

## 2021-09-23 NOTE — ED Notes (Signed)
Handoff report to carelink

## 2021-09-23 NOTE — Consult Note (Deleted)
Cardiology Consultation:   Patient ID: Felicia Pham MRN: [id number]; DOB: Apr 21, 1980  Admit date: 09/23/2021 Date of Consult: 09/23/2021  PCP:  Felicia Pham, [place] Prov[facility] Cardiologist:  None     Patient Profile:   Felicia Pham is a 41 y.o. female with a history of atypical chest pain with normal ETT in 2019 and prior DVT/PE who is being seen 09/23/2021 for the evaluation of chest pain at the request of Dr. Armandina Pham.  History of Present Illness:   Felicia Pham is a 41 year old female with the above history. Patient was previously referred to Cardiology in 10/2017 for further evaluation of chest pain and was seen by Felicia Deforest, PA-C. At that time, she reported intermittent chest discomfort with no clear correlation with exertion. Chest pain was felt to be atypical so ETT was ordered and was negative. She has not been seen by Cardiology since that time. Patient was admitted in 08/2020 with acute PE. Echo showed LVEF of 60-65% with normal wall motion, normal diastolic parameters, and no significant valvular disease. Lower extremity ultrasounds showed no evidence of DVT at that time. She was treated with Xarelto.  Patient presented to the [facility] ED today for further evaluation of chest pain. EKG showed no acute ischemic changes. High-sensitivity troponin negative x2 (<2 and <2). D-dimer negative. BNP normal. Chest x-ray showed no acute findings. Chest CTA negative for PE. WBC 7.8, Hgb 11.5, Plts 248. Na 136, K 3.7, Glucose 99, Cr 0.84. Patient was admitted to [facility] under medicine service and Cardiology consulted for further evaluation.      Past Medical History:  Diagnosis Date   DVT (deep venous thrombosis) ([facility])    PE (pulmonary thromboembolism) ([place])    Rib fracture    Uterine fibroid     Past Surgical History:  Procedure Laterality Date   MYOMECTOMY  2014     Home Medications:  Prior to Admission medications   Medication Sig Start Date End  Date Taking? Authorizing Provider  acetaminophen (TYLENOL) 325 MG tablet Take 2 tablets (650 mg total) by mouth every 6 (six) hours as needed for headache or mild pain. 09/06/20  Yes Felicia Bossier, MD  cetirizine-pseudoephedrine (ZYRTEC-D) 5-120 MG tablet Take 1 tablet by mouth daily. 10/07/20  Yes Felicia Congress, NP  Cholecalciferol (VITAMIN D3) 50 MCG (2000 UT) CAPS Take 4,000 Units by mouth daily.   Yes [provider]  ketoconazole (NIZORAL) 2 % cream Apply 1 application  topically 2 (two) times daily. 08/13/20  Yes [provider]  ketoconazole (NIZORAL) 2 % shampoo Apply 1 application  topically every 14 (fourteen) days. 08/13/20  Yes [provider]  Multiple Vitamins-Minerals (MULTIVITAMIN WITH MINERALS) tablet Take 1 tablet by mouth daily.   Yes [provider]  spironolactone (ALDACTONE) 25 MG tablet Take 25 mg by mouth daily as needed. 03/03/21  Yes [provider]    Inpatient Medications: Scheduled Meds:  aspirin  325 mg Oral Daily   Continuous Infusions:  PRN Meds: nitroGLYCERIN  Allergies:   No Known Allergies  Social History:   Social History   Socioeconomic History   Marital status: Single    Spouse name: Not on file   Number of children: Not on file   Years of education: Not on file   Highest education level: Not on file  Occupational History   Not on file  Tobacco Use   Smoking status: Never   Smokeless tobacco: Never  Vaping  Use   Vaping Use: Never used  Substance and Sexual Activity   Alcohol use: No   Drug use: No   Sexual activity: Not Currently    Partners: Male    Birth control/protection: None    Comment: pt has had Lupron- 02/2021  Other Topics Concern   Not on file  Social History Narrative   Not on file   Social Determinants of Health   Financial Resource Strain: Not on file  Food Insecurity: Not on file  Transportation Needs: Not on file  Physical Activity: Not on file  Stress: Not on  file  Social Connections: Not on file  Intimate Partner Violence: Not on file    Family History:    Family History  Problem Relation Age of Onset   Lupus Mother    Hypertension Mother    Hypertension Father    Diabetes Father    Heart attack Father 70   Diabetes Maternal Grandmother    Diabetes Paternal Grandmother      ROS:  Please see the history of present illness.  General:no colds or fevers, no weight changes Skin:no rashes or ulcers HEENT:no blurred vision, no congestion CV:see HPI PUL:see HPI GI:no diarrhea constipation or melena, no indigestion GU:no hematuria, no dysuria MS:no joint pain, no claudication Neuro:no syncope, no lightheadedness Endo:no diabetes, no thyroid disease  All other ROS reviewed and negative.     Physical Exam/Data:   Vitals:   09/23/21 1300 09/23/21 1400 09/23/21 1500 09/23/21 1600  BP: 127/78  112/69 120/83  Pulse: 85  76 82  Resp: '18 18 18 18  '$ Temp:      TempSrc:      SpO2: 98%  97% 99%  Weight:      Height:       No intake or output data in the 24 hours ending 09/23/21 1652    09/23/2021    5:35 AM 07/10/2021    4:18 PM 06/04/2021    9:28 AM  Last 3 Weights  Weight (lbs) 271 lb 273 lb 11.2 oz 272 lb  Weight (kg) 122.925 kg 124.15 kg 123.378 kg     Body mass index is 46.52 kg/m.  Per Dr. Stanford Pham  EKG:  The EKG was personally reviewed and demonstrates:  Normal sinus rhythm, rate 94 bpm, with no acute ST/T changes. Normal axis. Normal PR and QRS intervals. QTc 406 ms. Telemetry:  Telemetry was personally reviewed and demonstrates: SR  Relevant CV Studies:  ETT 12/01/2017: Blood pressure demonstrated a blunted response to exercise. There was no ST segment deviation noted during stress.   Normal ETT Blunted BP response to exercise _______________  Echocardiogram 09/05/2020: Impressions: 1. Left ventricular ejection fraction, by estimation, is 60 to 65%. The  left ventricle has normal function. The left ventricle has  no regional  wall motion abnormalities. Left ventricular diastolic parameters were  normal.   2. Right ventricular systolic function is normal. The right ventricular  size is normal. There is normal pulmonary artery systolic pressure.   3. The mitral valve is normal in structure. No evidence of mitral valve  regurgitation. No evidence of mitral stenosis.   4. The aortic valve is normal in structure. Aortic valve regurgitation is  not visualized. No aortic stenosis is present.   5. The inferior vena cava is normal in size with greater than 50%  respiratory variability, suggesting right atrial pressure of 3 mmHg.   Laboratory Data:  High Sensitivity Troponin:   Recent Labs  Lab 09/23/21 0550  09/23/21 0740  TROPONINIHS <2 <2     Chemistry Recent Labs  Lab 09/23/21 0550  NA 136  K 3.7  CL 104  CO2 23  GLUCOSE 99  BUN 13  CREATININE 0.84  CALCIUM 9.3  GFRNONAA >60  ANIONGAP 9    No results for input(s): "PROT", "ALBUMIN", "AST", "ALT", "ALKPHOS", "BILITOT" in the last 168 hours. Lipids No results for input(s): "CHOL", "TRIG", "HDL", "LABVLDL", "LDLCALC", "CHOLHDL" in the last 168 hours.  Hematology Recent Labs  Lab 09/23/21 0550  WBC 7.8  RBC 4.14  HGB 11.5*  HCT 34.9*  MCV 84.3  MCH 27.8  MCHC 33.0  RDW 15.2  PLT 248   Thyroid No results for input(s): "TSH", "FREET4" in the last 168 hours.  BNP Recent Labs  Lab 09/23/21 0550  BNP 11.8    DDimer  Recent Labs  Lab 09/23/21 0550  DDIMER 0.48     Radiology/Studies:  CT Angio Chest PE W and/or Wo Contrast  Result Date: 09/23/2021 CLINICAL DATA:  41 year old female with history of chest pain. Evaluate for pulmonary embolism. EXAM: CT ANGIOGRAPHY CHEST WITH CONTRAST TECHNIQUE: Multidetector CT imaging of the chest was performed using the standard protocol during bolus administration of intravenous contrast. Multiplanar CT image reconstructions and MIPs were obtained to evaluate the vascular anatomy.  RADIATION DOSE REDUCTION: This exam was performed according to the departmental dose-optimization program which includes automated exposure control, adjustment of the mA and/or kV according to patient size and/or use of iterative reconstruction technique. CONTRAST:  131m OMNIPAQUE IOHEXOL 350 MG/ML SOLN COMPARISON:  Chest CTA 06/01/2021. FINDINGS: Cardiovascular: No filling defects within the pulmonary arterial tree to suggest pulmonary embolism. Heart size is normal. There is no significant pericardial fluid, thickening or pericardial calcification. No atherosclerotic calcifications are noted in the thoracic aorta or the coronary arteries. Mediastinum/Nodes: No pathologically enlarged mediastinal or hilar lymph nodes. Esophagus is unremarkable in appearance. No axillary lymphadenopathy. Lungs/Pleura: No acute consolidative airspace disease. No pleural effusions. No suspicious appearing pulmonary nodules or masses are noted. Mild scarring in the dependent portion of the left lower lobe. Upper Abdomen: Unremarkable. Musculoskeletal: There are no aggressive appearing lytic or blastic lesions noted in the visualized portions of the skeleton. Review of the MIP images confirms the above findings. IMPRESSION: 1. No evidence of pulmonary embolism. No acute findings in the thorax to account for the patient's symptoms. Electronically Signed   By: DVinnie LangtonM.D.   On: 09/23/2021 07:42   DG Chest 2 View  Result Date: 09/23/2021 CLINICAL DATA:  41year old female with history of chest pain and shortness of breath. EXAM: CHEST - 2 VIEW COMPARISON:  Chest x-ray 05/31/2021. FINDINGS: Lung volumes are normal. No consolidative airspace disease. No pleural effusions. No pneumothorax. No pulmonary nodule or mass noted. Pulmonary vasculature and the cardiomediastinal silhouette are within normal limits. IMPRESSION: 1. No radiographic evidence of acute cardiopulmonary disease. Electronically Signed   By: DVinnie Langton M.D.   On: 09/23/2021 06:24     Assessment and Plan:   Chest Pain    Risk Assessment/Risk Scores:     HEAR Score (for undifferentiated chest pain):             For questions or updates, please contact [place] consult [email or web address] for contact info under    Signed, CDarreld Mclean PA-C  09/23/2021 4:52 PM

## 2021-09-23 NOTE — Assessment & Plan Note (Addendum)
41 year old presenting with chest pain x 3 days -obs to telemetry -history and exam atypical for cardiac cause  -troponin wnl, bnp wnl, CTA chest with no PE -pain reproducible on exam and more consistent with MSK/costochondritis. toradol prn and would d/c on NSAID. Heating pad to chest prn/chest wall stretches.  -cardiology consulted with plans to recycle troponin and if wnl can d/c home in AM. CT coronary scan outpatient

## 2021-09-23 NOTE — Assessment & Plan Note (Addendum)
08/2020. Completed 6 months course of AC. While on OCP, hormonal contraception contraindicated CTA negative for PE

## 2021-09-23 NOTE — ED Notes (Signed)
Handoff report given to Mardene Celeste RN  on 4E at [facility]

## 2021-09-23 NOTE — ED Triage Notes (Signed)
Chest pain that started on Sat, worse today. 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 C/o feeling sob with walking. States pain is constant and sharp.

## 2021-09-23 NOTE — Discharge Instructions (Addendum)
Felicia Pham,  You were in the hospital with chest pain. Thankfully, this does not appear to be related to your heart. You also do not have a pulmonary embolism (blood clot in your chest). Please follow-up with your primary care physician.

## 2021-09-23 NOTE — Consult Note (Signed)
Cardiology Consultation:   Patient ID: Felicia Pham MRN: [id number]; DOB: Dec 29, 1980  Admit date: 09/23/2021 Date of Consult: 09/23/2021  PCP:  Janie Morning, [place] Providers Cardiologist:  }     Patient Profile:   Felicia Pham is a 41 y.o. female with a hx of DVT/pulmonary embolus who is being seen 09/23/2021 for the evaluation of chest pain at the request of Felicia Bongo, MD.  History of Present Illness:   Exercise treadmill August 2019 normal.  Echocardiogram May 2022 showed normal LV function.  Venous Dopplers December 2022 showed no DVT.  Has had intermittent chest pain in the past.  She developed recurrent chest pain Saturday evening at 7 PM.  It is substernal without radiation.  It is described as a sharp pain.  It has been continuous.  Some increase with inspiration and swallowing.  There is no associated symptoms.  She was seen and transferred for further evaluation.  Her chest pain persist.  She has not had significant dyspnea on exertion, orthopnea, PND, pedal edema.   Past Medical History:  Diagnosis Date   DVT (deep venous thrombosis) ([facility])    PE (pulmonary thromboembolism) ([facility])    Rib fracture    Uterine fibroid     Past Surgical History:  Procedure Laterality Date   MYOMECTOMY  2014    Inpatient Medications: Scheduled Meds:  aspirin  325 mg Oral Daily   Continuous Infusions:  PRN Meds: nitroGLYCERIN  Allergies:   No Known Allergies  Social History:   Social History   Socioeconomic History   Marital status: Single    Spouse name: Not on file   Number of children: Not on file   Years of education: Not on file   Highest education level: Not on file  Occupational History   Not on file  Tobacco Use   Smoking status: Never   Smokeless tobacco: Never  Vaping Use   Vaping Use: Never used  Substance and Sexual Activity   Alcohol use: No   Drug use: No   Sexual activity: Not Currently    Partners: Male    Birth  control/protection: None    Comment: pt has had Lupron- 02/2021  Other Topics Concern   Not on file  Social History Narrative   Not on file   Social Determinants of Health   Financial Resource Strain: Not on file  Food Insecurity: Not on file  Transportation Needs: Not on file  Physical Activity: Not on file  Stress: Not on file  Social Connections: Not on file  Intimate Partner Violence: Not on file    Family History:    Family History  Problem Relation Age of Onset   Lupus Mother    Hypertension Mother    Hypertension Father    Diabetes Father    Heart attack Father 34   Diabetes Maternal Grandmother    Diabetes Paternal Grandmother      ROS:  Please see the history of present illness.  No fevers, chills, reported productive cough or hemoptysis. All other ROS reviewed and negative.     Physical Exam/Data:   Vitals:   09/23/21 1500 09/23/21 1600 09/23/21 1656 09/23/21 1756  BP: 112/69 120/83  117/74  Pulse: 76 82  78  Resp: '18 18  16  '$ Temp:   98.3 F (36.8 C) (!) 97.4 F (36.3 C)  TempSrc:    Oral  SpO2: 97% 99%  99%  Weight:      Height:  No intake or output data in the 24 hours ending 09/23/21 1818    09/23/2021    5:35 AM 07/10/2021    4:18 PM 06/04/2021    9:28 AM  Last 3 Weights  Weight (lbs) 271 lb 273 lb 11.2 oz 272 lb  Weight (kg) 122.925 kg 124.15 kg 123.378 kg     Body mass index is 46.52 kg/m.  General:  Well nourished, well developed, in no acute distress HEENT: normal Neck: no JVD Vascular: No carotid bruits; Distal pulses 2+ bilaterally Cardiac:  normal S1, S2; RRR; no murmur  Lungs:  clear to auscultation bilaterally, no wheezing, rhonchi or rales no chest pain is reproduced with palpation. Abd: soft, nontender, no hepatomegaly  Ext: no edema Musculoskeletal:  No deformities, BUE and BLE strength normal and equal Skin: warm and dry  Neuro:  CNs 2-12 intact, no focal abnormalities noted Psych:  Normal affect   EKG:  The EKG  was personally reviewed and demonstrates: Normal sinus rhythm with no ST changes.   Laboratory Data:  High Sensitivity Troponin:   Recent Labs  Lab 09/23/21 0550 09/23/21 0740  TROPONINIHS <2 <2     Chemistry Recent Labs  Lab 09/23/21 0550  NA 136  K 3.7  CL 104  CO2 23  GLUCOSE 99  BUN 13  CREATININE 0.84  CALCIUM 9.3  GFRNONAA >60  ANIONGAP 9     Hematology Recent Labs  Lab 09/23/21 0550  WBC 7.8  RBC 4.14  HGB 11.5*  HCT 34.9*  MCV 84.3  MCH 27.8  MCHC 33.0  RDW 15.2  PLT 248    BNP Recent Labs  Lab 09/23/21 0550  BNP 11.8    DDimer  Recent Labs  Lab 09/23/21 0550  DDIMER 0.48     Radiology/Studies:  CT Angio Chest PE W and/or Wo Contrast  Result Date: 09/23/2021 CLINICAL DATA:  41 year old female with history of chest pain. Evaluate for pulmonary embolism. EXAM: CT ANGIOGRAPHY CHEST WITH CONTRAST TECHNIQUE: Multidetector CT imaging of the chest was performed using the standard protocol during bolus administration of intravenous contrast. Multiplanar CT image reconstructions and MIPs were obtained to evaluate the vascular anatomy. RADIATION DOSE REDUCTION: This exam was performed according to the departmental dose-optimization program which includes automated exposure control, adjustment of the mA and/or kV according to patient size and/or use of iterative reconstruction technique. CONTRAST:  150m OMNIPAQUE IOHEXOL 350 MG/ML SOLN COMPARISON:  Chest CTA 06/01/2021. FINDINGS: Cardiovascular: No filling defects within the pulmonary arterial tree to suggest pulmonary embolism. Heart size is normal. There is no significant pericardial fluid, thickening or pericardial calcification. No atherosclerotic calcifications are noted in the thoracic aorta or the coronary arteries. Mediastinum/Nodes: No pathologically enlarged mediastinal or hilar lymph nodes. Esophagus is unremarkable in appearance. No axillary lymphadenopathy. Lungs/Pleura: No acute consolidative  airspace disease. No pleural effusions. No suspicious appearing pulmonary nodules or masses are noted. Mild scarring in the dependent portion of the left lower lobe. Upper Abdomen: Unremarkable. Musculoskeletal: There are no aggressive appearing lytic or blastic lesions noted in the visualized portions of the skeleton. Review of the MIP images confirms the above findings. IMPRESSION: 1. No evidence of pulmonary embolism. No acute findings in the thorax to account for the patient's symptoms. Electronically Signed   By: DVinnie LangtonM.D.   On: 09/23/2021 07:42   DG Chest 2 View  Result Date: 09/23/2021 CLINICAL DATA:  41year old female with history of chest pain and shortness of breath. EXAM: CHEST - 2 VIEW  COMPARISON:  Chest x-ray 05/31/2021. FINDINGS: Lung volumes are normal. No consolidative airspace disease. No pleural effusions. No pneumothorax. No pulmonary nodule or mass noted. Pulmonary vasculature and the cardiomediastinal silhouette are within normal limits. IMPRESSION: 1. No radiographic evidence of acute cardiopulmonary disease. Electronically Signed   By: Vinnie Langton M.D.   On: 09/23/2021 06:24     Assessment and Plan:   Symptoms are extremely atypical.  They have been persistent since Saturday without completely resolving.  Electrocardiogram shows no ST changes.  Troponins are normal.  CVA has essentially ruled out.  However given persistent symptoms we will repeat enzymes.  If negative we will plan to discharge tomorrow morning.  Can arrange outpatient cardiac CTA.  Note CTA today shows no pulmonary embolus.  It also demonstrated no coronary calcification. History of pulmonary embolus-follow-up CTA this admission shows no pulmonary embolus. Fibroids-patient is scheduled to have myomectomy in approximately 1 month.   For questions or updates, please contact [place] Please consult [email or web address] for contact info under    Signed, Kirk Ruths, MD  09/23/2021 6:18 PM

## 2021-09-23 NOTE — Progress Notes (Signed)
Plan of Care Note for accepted transfer   Patient: Felicia Pham MRN: [id number]   DOA: 09/23/2021  Facility requesting transfer: [facility] Requesting Provider: Armandina Gemma Reason for transfer: chest pain Facility course: Patient with h/o fibroids and DVT/PE presenting with CP/SOB.  Trying to avoid admission by doing ER:ER admission but on ER field triage.  Chest pain with h/o PE, not on AC.  Symptoms started after 3 hour car ride.  CTA negative for PE.  Has never had cardiac work-up.  Pain 10 ->7 after NTG.  Cards recommends coronary CTA tomorrow.  Needs overnight evaluation.  Cardiology will consult.   Plan of care: The patient is accepted for admission to Telemetry unit, at [facility].   Author: Karmen Bongo, MD 09/23/2021  Check [email or web address] for on-call coverage.  Nursing staff, Please call [place] number on Amion as soon as patient's arrival, so appropriate admitting provider can evaluate the pt.

## 2021-09-23 NOTE — Assessment & Plan Note (Signed)
Baseline of 10-11, stable Planned myomectomy for fibroid in July 2023

## 2021-09-23 NOTE — Progress Notes (Signed)
Patient arrived via carelink. C/o chest pain. Labwork and all tests have been normal. Transferred for CT angio of heart tomorrow. Hospitalist notified of patient arrival. CHG bath given, CCMD notified of admission. Skin is intact. Fuller Canada, RN

## 2021-09-23 NOTE — ED Notes (Signed)
Handoff report given to carelink 

## 2021-09-23 NOTE — ED Provider Notes (Addendum)
[facility] EMERGENCY DEPT Provider Note   CSN: [id number] Arrival date & time: 09/23/21  0514     History  Chief Complaint  Patient presents with   Chest Pain    Felicia Pham is a 41 y.o. female.   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  The patient states that she traveled for 3 hours in the car on Saturday.  Later that evening she began develop sharp substernal chest pain with some radiation to her back.  She endorses some shortness of breath while walking.  She states that the pain feels like her prior PEs.  The pain is constant, sharp.  No cough, fever or chills.  She denies any worsening of pain with sitting up or lying flat.  She denies a history of reflux and denies any burning sensation.  She denies any rash along her chest wall.  Home Medications Prior to Admission medications   Medication Sig Start Date End Date Taking? Authorizing Provider  acetaminophen (TYLENOL) 325 MG tablet Take 2 tablets (650 mg total) by mouth every 6 (six) hours as needed for headache or mild pain. 09/06/20   Allie Bossier, MD  cetirizine-pseudoephedrine (ZYRTEC-D) 5-120 MG tablet Take 1 tablet by mouth daily. 10/07/20   Faustino Congress, NP  ketoconazole (NIZORAL) 2 % cream Apply 1 application topically 2 (two) times daily. Patient not taking: Reported on 07/25/2021 08/13/20   [provider]  ketoconazole (NIZORAL) 2 % shampoo Apply 1 application topically every 14 (fourteen) days. Patient not taking: Reported on 07/25/2021 08/13/20   [provider]  spironolactone (ALDACTONE) 25 MG tablet Take 25 mg by mouth daily as needed. 03/03/21   [provider]      Allergies    Patient has no known allergies.    Review of Systems   Review of Systems  Cardiovascular:  Positive for chest pain.  All other systems  reviewed and are negative.   Physical Exam Updated Vital Signs BP 123/84   Pulse 71   Temp 98.5 F (36.9 C) (Oral)   Resp 16   Ht '5\' 4"'$  (1.626 m)   Wt 122.9 kg   SpO2 100%   BMI 46.52 kg/m  Physical Exam Vitals and nursing note reviewed.  Constitutional:      General: She is not in acute distress.    Appearance: She is well-developed.  HENT:     Head: Normocephalic and atraumatic.  Eyes:     Conjunctiva/sclera: Conjunctivae normal.  Neck:     Vascular: No JVD.  Cardiovascular:     Rate and Rhythm: Normal rate and regular rhythm.     Heart sounds: No murmur heard.    No friction rub.  Pulmonary:     Effort: Pulmonary effort is normal. No respiratory distress.     Breath sounds: Normal breath sounds.  Chest:     Comments: Left-sided chest wall tenderness to palpation, no rash Abdominal:     Palpations: Abdomen is soft.     Tenderness: There is no abdominal tenderness.  Musculoskeletal:        General: No swelling.     Cervical back: Neck supple.     Right lower leg: No edema.     Left lower leg: No edema.  Skin:    General: Skin is warm and dry.     Capillary Refill: Capillary refill takes  less than 2 seconds.  Neurological:     Mental Status: She is alert.  Psychiatric:        Mood and Affect: Mood normal.     ED Results / Procedures / Treatments   Labs (all labs ordered are listed, but only abnormal results are displayed) Labs Reviewed  CBC - Abnormal; Notable for the following components:      Result Value   Hemoglobin 11.5 (*)    HCT 34.9 (*)    All other components within normal limits  D-DIMER, QUANTITATIVE  BASIC METABOLIC PANEL  BRAIN NATRIURETIC PEPTIDE  HCG, SERUM, QUALITATIVE  TROPONIN I (HIGH SENSITIVITY)  TROPONIN I (HIGH SENSITIVITY)    EKG EKG Interpretation  Date/Time:  Monday September 23 2021 05:36:16 EDT Ventricular Rate:  94 PR Interval:  114 QRS Duration: 83 QT Interval:  324 QTC Calculation: 406 R Axis:   37 Text  Interpretation: Sinus rhythm Confirmed by Randal Buba, April ([id number]) on 09/23/2021 5:45:47 AM  Radiology CT Angio Chest PE W and/or Wo Contrast  Result Date: 09/23/2021 CLINICAL DATA:  41 year old female with history of chest pain. Evaluate for pulmonary embolism. EXAM: CT ANGIOGRAPHY CHEST WITH CONTRAST TECHNIQUE: Multidetector CT imaging of the chest was performed using the standard protocol during bolus administration of intravenous contrast. Multiplanar CT image reconstructions and MIPs were obtained to evaluate the vascular anatomy. RADIATION DOSE REDUCTION: This exam was performed according to the departmental dose-optimization program which includes automated exposure control, adjustment of the mA and/or kV according to patient size and/or use of iterative reconstruction technique. CONTRAST:  159m OMNIPAQUE IOHEXOL 350 MG/ML SOLN COMPARISON:  Chest CTA 06/01/2021. FINDINGS: Cardiovascular: No filling defects within the pulmonary arterial tree to suggest pulmonary embolism. Heart size is normal. There is no significant pericardial fluid, thickening or pericardial calcification. No atherosclerotic calcifications are noted in the thoracic aorta or the coronary arteries. Mediastinum/Nodes: No pathologically enlarged mediastinal or hilar lymph nodes. Esophagus is unremarkable in appearance. No axillary lymphadenopathy. Lungs/Pleura: No acute consolidative airspace disease. No pleural effusions. No suspicious appearing pulmonary nodules or masses are noted. Mild scarring in the dependent portion of the left lower lobe. Upper Abdomen: Unremarkable. Musculoskeletal: There are no aggressive appearing lytic or blastic lesions noted in the visualized portions of the skeleton. Review of the MIP images confirms the above findings. IMPRESSION: 1. No evidence of pulmonary embolism. No acute findings in the thorax to account for the patient's symptoms. Electronically Signed   By: DVinnie LangtonM.D.   On: 09/23/2021 07:42    DG Chest 2 View  Result Date: 09/23/2021 CLINICAL DATA:  41year old female with history of chest pain and shortness of breath. EXAM: CHEST - 2 VIEW COMPARISON:  Chest x-ray 05/31/2021. FINDINGS: Lung volumes are normal. No consolidative airspace disease. No pleural effusions. No pneumothorax. No pulmonary nodule or mass noted. Pulmonary vasculature and the cardiomediastinal silhouette are within normal limits. IMPRESSION: 1. No radiographic evidence of acute cardiopulmonary disease. Electronically Signed   By: DVinnie LangtonM.D.   On: 09/23/2021 06:24    Procedures Procedures    Medications Ordered in ED Medications  nitroGLYCERIN (NITROSTAT) SL tablet 0.4 mg (0.4 mg Sublingual Given 09/23/21 0905)  aspirin tablet 325 mg (325 mg Oral Given 09/23/21 0913)  alum & mag hydroxide-simeth (MAALOX/MYLANTA) 200-200-20 MG/5ML suspension 30 mL (30 mLs Oral Given 09/23/21 0744)    And  lidocaine (XYLOCAINE) 2 % viscous mouth solution 15 mL (15 mLs Oral Given 09/23/21 0744)  ketorolac (TORADOL) 15 MG/ML injection 15  mg (15 mg Intravenous Given 09/23/21 0744)  iohexol (OMNIPAQUE) 350 MG/ML injection 100 mL (100 mLs Intravenous Contrast Given 09/23/21 0725)    ED Course/ Medical Decision Making/ A&P                           Medical Decision Making Amount and/or Complexity of Data Reviewed Labs: ordered. Radiology: ordered.  Risk OTC drugs. Prescription drug management.    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  The patient states that she traveled for 3 hours in the car on Saturday.  Later that evening she began develop sharp substernal chest pain with some radiation to her back.  She endorses some shortness of breath while walking.  She states that the pain feels like her prior PEs.  The pain is constant, sharp.  No cough, fever or chills.  She denies any worsening of  pain with sitting up or lying flat.  She denies a history of reflux and denies any burning sensation.  She denies any rash along her chest wall.  On arrival, the patient was vitally stable.  Sinus rhythm noted on cardiac telemetry.  EKG significant for sinus rhythm, ventricular rate 94, no ischemic changes noted.  Chest x-ray was performed, no radiographic evidence of cardiopulmonary disease.  Differential diagnosis includes pleuritis, costochondritis, PE, GERD, less likely ACS.  Laboratory evaluation significant for initial troponin normal, D-dimer negative at 0.48, CBC without a leukocytosis, anemia at 11.5, BMP unremarkable.  Repeat troponin negative.  Despite the patient's negative D-dimer, she has a prior history of DVT and PE and states that symptoms feel identical to her prior PE.  Given this higher suspicion clinically for PE, will proceed with CT angiogram of the chest to further evaluate.  CTA PE: FINDINGS:  Cardiovascular: No filling defects within the pulmonary arterial  tree to suggest pulmonary embolism. Heart size is normal. There is  no significant pericardial fluid, thickening or pericardial  calcification. No atherosclerotic calcifications are noted in the  thoracic aorta or the coronary arteries.    Mediastinum/Nodes: No pathologically enlarged mediastinal or hilar  lymph nodes. Esophagus is unremarkable in appearance. No axillary  lymphadenopathy.    Lungs/Pleura: No acute consolidative airspace disease. No pleural  effusions. No suspicious appearing pulmonary nodules or masses are  noted. Mild scarring in the dependent portion of the left lower  lobe.    Upper Abdomen: Unremarkable.    Musculoskeletal: There are no aggressive appearing lytic or blastic  lesions noted in the visualized portions of the skeleton.    Review of the MIP images confirms the above findings.    IMPRESSION:  1. No evidence of pulmonary embolism. No acute findings in the  thorax to  account for the patient's symptoms.   Reassuring findings without evidence of pericardial effusion, unremarkable esophagus, no evidence of pneumonia or airspace disease, no pleural effusion, no pulmonary nodules or masses, no PE.  On repeat assessment, the patient continues to endorse ongoing chest pressure.  She has had negative delta troponins.  While she does have reproducible chest wall tenderness, she also endorses a chest pressure which is different.  She has not had an ischemic cardiac evaluation previously.  She does occasionally endorse anginal symptoms.  Discussed admission for chest pain evaluation, cardiology consult versus discharge with plan for outpatient stress test.  Given the patient's ongoing chest pressure, cardiology  consultation was performed.  Spoke with Dr. Margaretann Loveless of cardiology who recommended ED to ED transfer for inpatient cardiology evaluation, consideration for coronary CT.  The patient was administered 325 mg of aspirin and sublingual nitroglycerin which did improve her pain somewhat.  Dr. Dina Rich of the [facility] emergency department Accepted the patient in ED to ED transfer.  EMTALA completed prior to transfer.  9:39 AM Was informed by Dr. Dina Rich that [facility] is currently on field triage and not accepting stable medical admits that dont need emergent procedure as transfers without bed available. Patient will need medical admission for further evaluation of ongoing chest pain. Hospitalist medicine consulted. Dr. Lorin Mercy accepted the patient in admission.   Final Clinical Impression(s) / ED Diagnoses Final diagnoses:  Chest pain, unspecified type  Acute chest wall pain    Rx / DC Orders ED Discharge Orders     None         Regan Lemming, MD 09/23/21 [id number]    Regan Lemming, MD 09/23/21 6314    Regan Lemming, MD 09/23/21 1006

## 2021-09-23 NOTE — H&P (Signed)
History and Physical    Patient: Felicia Pham VOJ:[id number] DOB: Apr 06, 1981 DOA: 09/23/2021 DOS: the patient was seen and examined on 09/23/2021 PCP: Janie Morning, DO  Patient coming from:  [place]  - lives with    Chief Complaint: chest pain   HPI: Felicia Pham is a 41 y.o. female with medical history significant of uterine fibroids, hx of DVT/PE who presented to ED with chest pain that started on Saturday.  She states the pain is constant. Pain rated as an 8/10. No radiation. Pain described as sharp and pressure with some discomfort in her back. She has no associated N/V, states only had shortness of breath once. She has had an occasional palpitations with swallowing. She denies any recent illness. She hasn't done any strenuous lifting. Does have reproducible pain over chest wall.   History of Mi in her father at age 25.  She has had exercise treadmill in 2019 which was normal. Echo 08/2020 normal LVF.    She has been feeling good. Denies any fever/chills, vision changes/headaches, shortness of breath or cough, abdominal pain, N/V/D, dysuria or leg swelling.    She does not smoke or drink alcohol.     ER Course:  vitals: afebrile, bp: 128/83, HR: 91, RR: 18, oxygen: 99%RA Pertinent labs: hgb: 11.5, troponin wnl x 2, d-dimer wnl,  CXR: no acute process CTA chest: no PE. No acute findings In ED: given ASA, GI cocktail, toradol, NG, morphine, cardiology consulted.     Review of Systems: As mentioned in the history of present illness. All other systems reviewed and are negative. Past Medical History:  Diagnosis Date   DVT (deep venous thrombosis) (HCC)    PE (pulmonary thromboembolism) ([place])    Rib fracture    Uterine fibroid    Past Surgical History:  Procedure Laterality Date   MYOMECTOMY  2014   Social History:  reports that she has never smoked. She has never used smokeless tobacco. She reports that she does not drink alcohol and does not use drugs.  No Known  Allergies  Family History  Problem Relation Age of Onset   Lupus Mother    Hypertension Mother    Hypertension Father    Diabetes Father    Heart attack Father 30   Diabetes Maternal Grandmother    Diabetes Paternal Grandmother     Prior to Admission medications   Medication Sig Start Date End Date Taking? Authorizing Provider  acetaminophen (TYLENOL) 325 MG tablet Take 2 tablets (650 mg total) by mouth every 6 (six) hours as needed for headache or mild pain. 09/06/20  Yes Allie Bossier, MD  cetirizine-pseudoephedrine (ZYRTEC-D) 5-120 MG tablet Take 1 tablet by mouth daily. 10/07/20  Yes Faustino Congress, NP  Cholecalciferol (VITAMIN D3) 50 MCG (2000 UT) CAPS Take 4,000 Units by mouth daily.   Yes [provider]  ketoconazole (NIZORAL) 2 % cream Apply 1 application  topically 2 (two) times daily. 08/13/20  Yes [provider]  ketoconazole (NIZORAL) 2 % shampoo Apply 1 application  topically every 14 (fourteen) days. 08/13/20  Yes [provider]  Multiple Vitamins-Minerals (MULTIVITAMIN WITH MINERALS) tablet Take 1 tablet by mouth daily.   Yes [provider]  spironolactone (ALDACTONE) 25 MG tablet Take 25 mg by mouth daily as needed. 03/03/21  Yes [provider]    Physical Exam: Vitals:   09/23/21 1500 09/23/21 1600 09/23/21 1656 09/23/21 1756  BP: 112/69 120/83  117/74  Pulse: 76 82  78  Resp: '18 18  16  '$ Temp:   98.3 F (36.8 C) (!) 97.4 F (36.3 C)  TempSrc:    Oral  SpO2: 97% 99%  99%  Weight:      Height:       General:  Appears calm and comfortable and is in NAD Eyes:  PERRL, EOMI, normal lids, iris ENT:  grossly normal hearing, lips & tongue, mmm; appropriate dentition Neck:  no LAD, masses or thyromegaly; no carotid bruits Cardiovascular:  RRR, no m/r/g. No LE edema.  Respiratory:   CTA bilaterally with no wheezes/rales/rhonchi.  Normal respiratory effort. Abdomen:  soft, NT, ND, NABS Back:   normal alignment, no  CVAT Skin:  no rash or induration seen on limited exam Musculoskeletal:  grossly normal tone BUE/BLE, good ROM, no bony abnormality. Reproducible pain over anterior chest wall  Lower extremity:  No LE edema.  Limited foot exam with no ulcerations.  2+ distal pulses. Psychiatric:  grossly normal mood and affect, speech fluent and appropriate, AOx3 Neurologic:  CN 2-12 grossly intact, moves all extremities in coordinated fashion, sensation intact   Radiological Exams on Admission: Independently reviewed - see discussion in A/P where applicable  CT Angio Chest PE W and/or Wo Contrast  Result Date: 09/23/2021 CLINICAL DATA:  41 year old female with history of chest pain. Evaluate for pulmonary embolism. EXAM: CT ANGIOGRAPHY CHEST WITH CONTRAST TECHNIQUE: Multidetector CT imaging of the chest was performed using the standard protocol during bolus administration of intravenous contrast. Multiplanar CT image reconstructions and MIPs were obtained to evaluate the vascular anatomy. RADIATION DOSE REDUCTION: This exam was performed according to the departmental dose-optimization program which includes automated exposure control, adjustment of the mA and/or kV according to patient size and/or use of iterative reconstruction technique. CONTRAST:  133m OMNIPAQUE IOHEXOL 350 MG/ML SOLN COMPARISON:  Chest CTA 06/01/2021. FINDINGS: Cardiovascular: No filling defects within the pulmonary arterial tree to suggest pulmonary embolism. Heart size is normal. There is no significant pericardial fluid, thickening or pericardial calcification. No atherosclerotic calcifications are noted in the thoracic aorta or the coronary arteries. Mediastinum/Nodes: No pathologically enlarged mediastinal or hilar lymph nodes. Esophagus is unremarkable in appearance. No axillary lymphadenopathy. Lungs/Pleura: No acute consolidative airspace disease. No pleural effusions. No suspicious appearing pulmonary nodules or masses are noted. Mild  scarring in the dependent portion of the left lower lobe. Upper Abdomen: Unremarkable. Musculoskeletal: There are no aggressive appearing lytic or blastic lesions noted in the visualized portions of the skeleton. Review of the MIP images confirms the above findings. IMPRESSION: 1. No evidence of pulmonary embolism. No acute findings in the thorax to account for the patient's symptoms. Electronically Signed   By: DVinnie LangtonM.D.   On: 09/23/2021 07:42   DG Chest 2 View  Result Date: 09/23/2021 CLINICAL DATA:  41year old female with history of chest pain and shortness of breath. EXAM: CHEST - 2 VIEW COMPARISON:  Chest x-ray 05/31/2021. FINDINGS: Lung volumes are normal. No consolidative airspace disease. No pleural effusions. No pneumothorax. No pulmonary nodule or mass noted. Pulmonary vasculature and the cardiomediastinal silhouette are within normal limits. IMPRESSION: 1. No radiographic evidence of acute cardiopulmonary disease. Electronically Signed   By: DVinnie LangtonM.D.   On: 09/23/2021 06:24    EKG: Independently reviewed.  NSR with rate 94; nonspecific ST changes with no evidence of acute ischemia   Labs on Admission: I have personally reviewed the available labs and imaging studies at the time of the admission.  Pertinent labs:   hgb:  11.5,  troponin wnl x 2,  d-dimer wnl,   Assessment and Plan: Principal Problem:   Chest pain of uncertain etiology Active Problems:   History of pulmonary embolism/DVT   Other iron deficiency anemias   Leiomyoma of uterus, unspecified    Assessment and Plan: * Chest pain of uncertain etiology 41 year old presenting with chest pain x 3 days -obs to telemetry -history and exam atypical for cardiac cause  -troponin wnl, bnp wnl, CTA chest with no PE -pain reproducible on exam and more consistent with MSK/costochondritis. toradol prn and would d/c on NSAID. Heating pad to chest prn/chest wall stretches.  -cardiology consulted with plans  to recycle troponin and if wnl can d/c home in AM. CT coronary scan outpatient     History of pulmonary embolism/DVT 08/2020. Completed 6 months course of AC. While on OCP, hormonal contraception contraindicated CTA negative for PE    Other iron deficiency anemias Baseline of 10-11, stable Planned myomectomy for fibroid in July 2023   Leiomyoma of uterus, unspecified  myomectomy at [facility] in July 2023  Last lupron injection in 02/2021     Advance Care Planning:   Code Status: Full Code   Consults: cardiology   DVT Prophylaxis: TED hose/ambulation    Family Communication: sister Jerrell Ehrler at bedside   Severity of Illness: The appropriate patient status for this patient is OBSERVATION. Observation status is judged to be reasonable and necessary in order to provide the required intensity of service to ensure the patient's safety. The patient's presenting symptoms, physical exam findings, and initial radiographic and laboratory data in the context of their medical condition is felt to place them at decreased risk for further clinical deterioration. Furthermore, it is anticipated that the patient will be medically stable for discharge from the hospital within 2 midnights of admission.   Author: Orma Flaming, MD 09/23/2021 7:25 PM  For on call review [email or web address].

## 2021-09-23 NOTE — ED Notes (Signed)
[facility] called for transport/ Dr Fulton Reek

## 2021-09-23 NOTE — ED Notes (Signed)
Patient transported to CT 

## 2021-09-23 NOTE — Assessment & Plan Note (Addendum)
myomectomy at [facility] in July 2023  Last lupron injection in 02/2021

## 2021-09-24 ENCOUNTER — Observation Stay (HOSPITAL_BASED_OUTPATIENT_CLINIC_OR_DEPARTMENT_OTHER): Payer: BC Managed Care – PPO

## 2021-09-24 DIAGNOSIS — R0789 Other chest pain: Secondary | ICD-10-CM | POA: Diagnosis not present

## 2021-09-24 DIAGNOSIS — R079 Chest pain, unspecified: Secondary | ICD-10-CM | POA: Diagnosis not present

## 2021-09-24 MED ORDER — IBUPROFEN 600 MG PO TABS: 600.0000 mg | ORAL_TABLET | Freq: Three times a day (TID) | ORAL | 0 refills | Status: AC | PRN

## 2021-09-24 MED ORDER — METOPROLOL TARTRATE 100 MG PO TABS
100.0000 mg | ORAL_TABLET | ORAL | Status: AC
  Administered 2021-09-24: 100 mg via ORAL
  Filled 2021-09-24: qty 1

## 2021-09-24 MED ORDER — METOPROLOL TARTRATE 100 MG PO TABS: 100.0000 mg | ORAL_TABLET | Freq: Once | ORAL | Status: DC

## 2021-09-24 MED ORDER — NITROGLYCERIN 0.4 MG SL SUBL
SUBLINGUAL_TABLET | SUBLINGUAL | Status: AC
  Administered 2021-09-24: 0.8 mg via SUBLINGUAL
  Filled 2021-09-24: qty 2

## 2021-09-24 MED ORDER — NITROGLYCERIN 0.4 MG SL SUBL: 0.8000 mg | SUBLINGUAL_TABLET | Freq: Once | SUBLINGUAL | Status: AC

## 2021-09-24 MED ORDER — IVABRADINE HCL 7.5 MG PO TABS
15.0000 mg | ORAL_TABLET | Freq: Once | ORAL | Status: AC
  Administered 2021-09-24: 15 mg via ORAL
  Filled 2021-09-24: qty 2

## 2021-09-24 MED ORDER — IOHEXOL 350 MG/ML SOLN
100.0000 mL | Freq: Once | INTRAVENOUS | Status: AC | PRN
  Administered 2021-09-24: 100 mL via INTRAVENOUS

## 2021-09-24 NOTE — Discharge Summary (Addendum)
Physician Discharge Summary   Patient: Felicia Pham MRN: [id number] DOB: 1980/07/06  Admit date:     09/23/2021  Discharge date: 09/24/21  Discharge Physician: Cordelia Poche, MD   PCP: Janie Morning, DO   Recommendations at discharge:  Follow-up with PCP  Discharge Diagnoses: Principal Problem:   Chest pain of uncertain etiology Active Problems:   History of pulmonary embolism/DVT   Other iron deficiency anemias   Leiomyoma of uterus, unspecified   Hospital Course: Felicia Pham is a 41 y.o. female with a history of uterine fibroids, DVT/PE not presently on anticoagulation.  Patient is in secondary to chest pain.  Patient underwent chest pain rule out.  Troponin negative, EKG nonischemic, CT chest ruled out acute PE, pneumonia, pneumothorax.  Cardiology consulted and cardiac CT ordered which was low risk.  Pain is reproducible and consistent with possible costochondritis.  Patient to follow-up with PCP as an outpatient.  Assessment and Plan:  Chest pain Possible costochondritis.  Work-up for ACS, acute PE, pneumonia, pneumothorax negative.  Chest pain improved prior to discharge although still slightly present.  Patient to follow-up with her primary care physician as an outpatient.  History of pulmonary embolism/DVT Patient completed anticoagulation. No PE on CTA chest.  Iron deficiency anemia Stable.  Leiomyoma of uterus Noted.   Consultants: Cardiology Procedures performed: None  Disposition: Home Diet recommendation: Regular diet  DISCHARGE MEDICATION: Allergies as of 09/24/2021   No Known Allergies      Medication List     TAKE these medications    acetaminophen 325 MG tablet Commonly known as: TYLENOL Take 2 tablets (650 mg total) by mouth every 6 (six) hours as needed for headache or mild pain.   cetirizine-pseudoephedrine 5-120 MG tablet Commonly known as: ZYRTEC-D Take 1 tablet by mouth daily.   ibuprofen 600 MG tablet Commonly known as:  ADVIL Take 1 tablet (600 mg total) by mouth every 8 (eight) hours as needed for up to 5 days.   ketoconazole 2 % cream Commonly known as: NIZORAL Apply 1 application  topically 2 (two) times daily.   ketoconazole 2 % shampoo Commonly known as: NIZORAL Apply 1 application  topically every 14 (fourteen) days.   multivitamin with minerals tablet Take 1 tablet by mouth daily.   spironolactone 25 MG tablet Commonly known as: ALDACTONE Take 25 mg by mouth daily as needed.   vitamin D3 50 MCG (2000 UT) Caps Take 4,000 Units by mouth daily.        Follow-up Information     Janie Morning, DO. Schedule an appointment as soon as possible for a visit .   Specialty: Family Medicine Why: As needed Contact information: [address] [place] [address] [phone]                Discharge Exam: BP 102/79 (BP Location: Left Wrist)   Pulse 64   Temp 97.6 F (36.4 C) (Oral)   Resp 17   Ht '5\' 4"'$  (1.626 m)   Wt 122.9 kg   SpO2 100%   BMI 46.52 kg/m   General exam: Appears calm and comfortable Respiratory system: Clear to auscultation. Respiratory effort normal. Cardiovascular system: S1 & S2 heard, RRR. No murmurs. Gastrointestinal system: Abdomen is nondistended, soft and nontender. Normal bowel sounds heard. Central nervous system: Alert and oriented. No focal neurological deficits. Musculoskeletal: No edema. No calf tenderness Skin: No cyanosis. No rashes Psychiatry: Judgement and insight appear normal. Mood & affect appropriate.   Condition at discharge:  stable  The results of significant diagnostics from this hospitalization (including imaging, microbiology, ancillary and laboratory) are listed below for reference.   Imaging Studies: CT CORONARY MORPH W/CTA COR W/SCORE W/CA W/CM &/OR WO/CM  Addendum Date: 09/24/2021   ADDENDUM REPORT: 09/24/2021 16:04 EXAM: OVER-READ INTERPRETATION  CT CHEST The following report is a limited chest CT over-read  performed by radiologist Dr. Abigail Miyamoto of [facility], [place] on 09/24/2021. This over-read does not include interpretation of cardiac or coronary anatomy or pathology. The coronary CTA interpretation by the cardiologist is attached. COMPARISON:  CTA chest of 1 day prior FINDINGS: Vascular: Normal aortic caliber. No central pulmonary embolism, on this non-dedicated study. Mediastinum/Nodes: No imaged thoracic adenopathy. Lungs/Pleura: No pleural fluid. Minimal scarring or subsegmental atelectasis in the superior segment left lower lobe. Upper Abdomen: Normal imaged portions of the liver, spleen, stomach. Musculoskeletal: No acute osseous abnormality. IMPRESSION: No acute findings in the imaged extracardiac chest. Electronically Signed   By: Abigail Miyamoto M.D.   On: 09/24/2021 16:04   Result Date: 09/24/2021 CLINICAL DATA:  41 yo female with chest pain EXAM: Cardiac/Coronary CTA TECHNIQUE: A non-contrast, gated CT scan was obtained with axial slices of 3 mm through the heart for calcium scoring. Calcium scoring was performed using the Agatston method. A 120 kV prospective, gated, contrast cardiac scan was obtained. Gantry rotation speed was 250 msecs and collimation was 0.6 mm. Two sublingual nitroglycerin tablets (0.8 mg) were given. The 3D data set was reconstructed in 5% intervals of the 35-75% of the R-R cycle. Diastolic phases were analyzed on a dedicated workstation using MPR, MIP, and VRT modes. The patient received 95 cc of contrast. FINDINGS: Image quality: Excellent. Noise artifact is: Limited. Coronary Arteries:  Normal coronary origin.  Right dominance. Left main: The left main is a large caliber vessel with a normal take off from the left coronary cusp that trifurcates into a LAD, LCX, and ramus intermedius. There is no plaque or stenosis. Left anterior descending artery: The LAD is patent without evidence of plaque or stenosis. The LAD gives off large D1 and small D2, D3 and D4; no disease noted.  Ramus intermedius: Patent with no evidence of plaque or stenosis. Left circumflex artery: The LCX is non-dominant and patent with no evidence of plaque or stenosis. The LCX gives off 1 large, branching patent obtuse marginal branch. Right coronary artery: The RCA is dominant with normal take off from the right coronary cusp. There is no evidence of plaque or stenosis. The RCA terminates as a PDA and right posterolateral branch without evidence of plaque or stenosis. Right Atrium: Right atrial size is within normal limits. Right Ventricle: The right ventricular cavity is within normal limits. Left Atrium: Left atrial size is normal in size with no left atrial appendage filling defect. Left Ventricle: The ventricular cavity size is within normal limits. There are no stigmata of prior infarction. There is no abnormal filling defect. Pulmonary arteries: Normal in size without proximal filling defect. Pulmonary veins: Normal pulmonary venous drainage. Pericardium: Normal thickness with no significant effusion or calcium present. Cardiac valves: The aortic valve is trileaflet without significant calcification. The mitral valve is normal structure without significant calcification. Aorta: Normal caliber with no significant disease. Extra-cardiac findings: See attached radiology report for non-cardiac structures. IMPRESSION: 1. Coronary calcium score of 0. This was 0 percentile for age-, sex, and race-matched controls. 2. Normal coronary origin with right dominance. 3. No evidence of CAD. RECOMMENDATIONS: CAD-RADS 0: No evidence of CAD (0%).  Consider non-atherosclerotic causes of chest pain. Kirk Ruths, MD Electronically Signed: By: Kirk Ruths M.D. On: 09/24/2021 15:47   CT Angio Chest PE W and/or Wo Contrast  Result Date: 09/23/2021 CLINICAL DATA:  41 year old female with history of chest pain. Evaluate for pulmonary embolism. EXAM: CT ANGIOGRAPHY CHEST WITH CONTRAST TECHNIQUE: Multidetector CT imaging of the  chest was performed using the standard protocol during bolus administration of intravenous contrast. Multiplanar CT image reconstructions and MIPs were obtained to evaluate the vascular anatomy. RADIATION DOSE REDUCTION: This exam was performed according to the departmental dose-optimization program which includes automated exposure control, adjustment of the mA and/or kV according to patient size and/or use of iterative reconstruction technique. CONTRAST:  171m OMNIPAQUE IOHEXOL 350 MG/ML SOLN COMPARISON:  Chest CTA 06/01/2021. FINDINGS: Cardiovascular: No filling defects within the pulmonary arterial tree to suggest pulmonary embolism. Heart size is normal. There is no significant pericardial fluid, thickening or pericardial calcification. No atherosclerotic calcifications are noted in the thoracic aorta or the coronary arteries. Mediastinum/Nodes: No pathologically enlarged mediastinal or hilar lymph nodes. Esophagus is unremarkable in appearance. No axillary lymphadenopathy. Lungs/Pleura: No acute consolidative airspace disease. No pleural effusions. No suspicious appearing pulmonary nodules or masses are noted. Mild scarring in the dependent portion of the left lower lobe. Upper Abdomen: Unremarkable. Musculoskeletal: There are no aggressive appearing lytic or blastic lesions noted in the visualized portions of the skeleton. Review of the MIP images confirms the above findings. IMPRESSION: 1. No evidence of pulmonary embolism. No acute findings in the thorax to account for the patient's symptoms. Electronically Signed   By: DVinnie LangtonM.D.   On: 09/23/2021 07:42   DG Chest 2 View  Result Date: 09/23/2021 CLINICAL DATA:  41year old female with history of chest pain and shortness of breath. EXAM: CHEST - 2 VIEW COMPARISON:  Chest x-ray 05/31/2021. FINDINGS: Lung volumes are normal. No consolidative airspace disease. No pleural effusions. No pneumothorax. No pulmonary nodule or mass noted. Pulmonary  vasculature and the cardiomediastinal silhouette are within normal limits. IMPRESSION: 1. No radiographic evidence of acute cardiopulmonary disease. Electronically Signed   By: DVinnie LangtonM.D.   On: 09/23/2021 06:24    Microbiology: Results for orders placed or performed during the hospital encounter of 09/04/20  SARS CORONAVIRUS 2 (TAT 6-24 HRS) Nasopharyngeal Nasopharyngeal Swab     Status: None   Collection Time: 09/04/20  9:31 PM   Specimen: Nasopharyngeal Swab  Result Value Ref Range Status   SARS Coronavirus 2 NEGATIVE NEGATIVE Final    Comment: (NOTE) SARS-CoV-2 target nucleic acids are NOT DETECTED.  The SARS-CoV-2 RNA is generally detectable in upper and lower respiratory specimens during the acute phase of infection. Negative results do not preclude SARS-CoV-2 infection, do not rule out co-infections with other pathogens, and should not be used as the sole basis for treatment or other patient management decisions. Negative results must be combined with clinical observations, patient history, and epidemiological information. The expected result is Negative.  Fact Sheet for Patients: hSugarRoll.be Fact Sheet for Healthcare Providers: hhttps://www.woods-mathews.com/ This test is not yet approved or cleared by the [place] and  has been authorized for detection and/or diagnosis of SARS-CoV-2 by FDA under an Emergency Use Authorization (EUA). This EUA will remain  in effect (meaning this test can be used) for the duration of the COVID-19 declaration under Se ction 564(b)(1) of the Act, 21 U.S.C. section 360bbb-3(b)(1), unless the authorization is terminated or revoked sooner.  Performed at [facility], [place] [address], [place],  [address]     Labs: CBC: Recent Labs  Lab 09/23/21 0550  WBC 7.8  HGB 11.5*  HCT 34.9*  MCV 84.3  PLT 037   Basic Metabolic Panel: Recent Labs  Lab 09/23/21 0550   NA 136  K 3.7  CL 104  CO2 23  GLUCOSE 99  BUN 13  CREATININE 0.84  CALCIUM 9.3   Discharge time spent: 35 minutes.  Signed: Cordelia Poche, MD [facility] 09/24/2021

## 2021-09-24 NOTE — Progress Notes (Signed)
Progress Note  Patient Name: Felicia Pham Date of Encounter: 09/24/2021  [facility] Cardiologist: New  Subjective   Continues with CP; no dyspnea  Inpatient Medications    Scheduled Meds:  aspirin  325 mg Oral Daily   sodium chloride flush  3 mL Intravenous Q12H   Continuous Infusions:  sodium chloride     PRN Meds: sodium chloride, acetaminophen **OR** acetaminophen, cyclobenzaprine, ketorolac, nitroGLYCERIN, sodium chloride flush   Vital Signs    Vitals:   09/23/21 2100 09/23/21 2324 09/24/21 0418 09/24/21 0749  BP: 114/77 112/67 100/65 112/73  Pulse: 92 87 87 83  Resp: (!) '22 15 20 20  '$ Temp: 98.3 F (36.8 C) 98.1 F (36.7 C) 98.4 F (36.9 C) 98.4 F (36.9 C)  TempSrc: Oral Oral Oral Oral  SpO2: 100% 100% 100% 100%  Weight:      Height:       No intake or output data in the 24 hours ending 09/24/21 0818    09/23/2021    5:35 AM 07/10/2021    4:18 PM 06/04/2021    9:28 AM  Last 3 Weights  Weight (lbs) 271 lb 273 lb 11.2 oz 272 lb  Weight (kg) 122.925 kg 124.15 kg 123.378 kg      Telemetry    Sinus - Personally Reviewed  Physical Exam   GEN: No acute distress.   Neck: No JVD Cardiac: RRR, no murmurs, rubs, or gallops.  Respiratory: Clear to auscultation bilaterally. GI: Soft, nontender, non-distended  MS: No edema Neuro:  Nonfocal  Psych: Normal affect   Labs    High Sensitivity Troponin:   Recent Labs  Lab 09/23/21 0550 09/23/21 0740  TROPONINIHS <2 <2     Chemistry Recent Labs  Lab 09/23/21 0550  NA 136  K 3.7  CL 104  CO2 23  GLUCOSE 99  BUN 13  CREATININE 0.84  CALCIUM 9.3  GFRNONAA >60  ANIONGAP 9     Hematology Recent Labs  Lab 09/23/21 0550  WBC 7.8  RBC 4.14  HGB 11.5*  HCT 34.9*  MCV 84.3  MCH 27.8  MCHC 33.0  RDW 15.2  PLT 248   Thyroid  Recent Labs  Lab 09/23/21 1931  TSH 4.353    BNP Recent Labs  Lab 09/23/21 0550  BNP 11.8    DDimer  Recent Labs  Lab 09/23/21 0550  DDIMER 0.48      Radiology    CT Angio Chest PE W and/or Wo Contrast  Result Date: 09/23/2021 CLINICAL DATA:  41 year old female with history of chest pain. Evaluate for pulmonary embolism. EXAM: CT ANGIOGRAPHY CHEST WITH CONTRAST TECHNIQUE: Multidetector CT imaging of the chest was performed using the standard protocol during bolus administration of intravenous contrast. Multiplanar CT image reconstructions and MIPs were obtained to evaluate the vascular anatomy. RADIATION DOSE REDUCTION: This exam was performed according to the departmental dose-optimization program which includes automated exposure control, adjustment of the mA and/or kV according to patient size and/or use of iterative reconstruction technique. CONTRAST:  148m OMNIPAQUE IOHEXOL 350 MG/ML SOLN COMPARISON:  Chest CTA 06/01/2021. FINDINGS: Cardiovascular: No filling defects within the pulmonary arterial tree to suggest pulmonary embolism. Heart size is normal. There is no significant pericardial fluid, thickening or pericardial calcification. No atherosclerotic calcifications are noted in the thoracic aorta or the coronary arteries. Mediastinum/Nodes: No pathologically enlarged mediastinal or hilar lymph nodes. Esophagus is unremarkable in appearance. No axillary lymphadenopathy. Lungs/Pleura: No acute consolidative airspace disease. No pleural effusions. No suspicious  appearing pulmonary nodules or masses are noted. Mild scarring in the dependent portion of the left lower lobe. Upper Abdomen: Unremarkable. Musculoskeletal: There are no aggressive appearing lytic or blastic lesions noted in the visualized portions of the skeleton. Review of the MIP images confirms the above findings. IMPRESSION: 1. No evidence of pulmonary embolism. No acute findings in the thorax to account for the patient's symptoms. Electronically Signed   By: Vinnie Langton M.D.   On: 09/23/2021 07:42   DG Chest 2 View  Result Date: 09/23/2021 CLINICAL DATA:  41 year old  female with history of chest pain and shortness of breath. EXAM: CHEST - 2 VIEW COMPARISON:  Chest x-ray 05/31/2021. FINDINGS: Lung volumes are normal. No consolidative airspace disease. No pleural effusions. No pneumothorax. No pulmonary nodule or mass noted. Pulmonary vasculature and the cardiomediastinal silhouette are within normal limits. IMPRESSION: 1. No radiographic evidence of acute cardiopulmonary disease. Electronically Signed   By: Vinnie Langton M.D.   On: 09/23/2021 06:24     Patient Profile     41 y.o. female with past medical history of DVT/pulmonary embolus admitted with chest pain.  CTA showed no pulmonary embolus.  Assessment & Plan    1 chest pain-as outlined previously symptoms are very atypical.  They have been persistent since Saturday without complete resolution.  Troponins and electrocardiogram unremarkable.  CTA showed no pulmonary embolus.  Patient requested inpatient cardiac CTA to exclude coronary disease.  Given history of recurrent chest pain I think it is reasonable to exclude coronary disease.  We will arrange.  2 history of pulmonary embolus-as above follow-up CTA showed no pulmonary embolus.  If CTA shows no coronary disease would discharge today and patient would not require further cardiac evaluation.  For questions or updates, please contact [place] Please consult [email or web address] for contact info under        Signed, Kirk Ruths, MD  09/24/2021, 8:18 AM  '

## 2021-09-24 NOTE — Progress Notes (Signed)
Per Dr. Stanford Breed, coronary CTA cardiac portion was normal. Still awaiting radiology overread but if this is unremarkable, OK to discharge from cardiology standpoint. CT angio yesterday did not show any significant findings. I relayed cardiac findings to the patient by phone and advised the overread was pending. Msg sent to Dr. Lonny Prude to make him aware of this update. Dr. Stanford Breed does not feel any cardiology f/u is needed at this time.

## 2021-09-30 ENCOUNTER — Ambulatory Visit: Payer: BC Managed Care – PPO

## 2021-10-02 ENCOUNTER — Ambulatory Visit (INDEPENDENT_AMBULATORY_CARE_PROVIDER_SITE_OTHER): Payer: BC Managed Care – PPO

## 2021-10-02 VITALS — BP 117/81 | HR 84 | Ht 64.0 in | Wt 282.0 lb

## 2021-10-02 DIAGNOSIS — Z23 Encounter for immunization: Secondary | ICD-10-CM

## 2021-10-02 DIAGNOSIS — R87618 Other abnormal cytological findings on specimens from cervix uteri: Secondary | ICD-10-CM | POA: Diagnosis not present

## 2021-10-02 NOTE — Progress Notes (Signed)
SUBJECTIVE: Felicia Pham is a 41 y.o. female who presents for 2nd Gardasil Injection.   OBJECTIVE: Appears well, in no apparent distress.  Vital signs are normal. .    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 RTO in 12 weeks (Sept 21, 2023) for 3rd. Final Gardasil Injection.

## 2021-10-02 NOTE — Progress Notes (Signed)
Agree with nurses's documentation of this patient's clinic encounter.  Millie Forde L, MD  

## 2021-10-12 HISTORY — PX: MYOMECTOMY ABDOMINAL APPROACH: SUR870

## 2021-12-30 ENCOUNTER — Ambulatory Visit: Payer: BC Managed Care – PPO

## 2022-01-02 ENCOUNTER — Ambulatory Visit: Payer: BC Managed Care – PPO

## 2022-01-09 ENCOUNTER — Ambulatory Visit (INDEPENDENT_AMBULATORY_CARE_PROVIDER_SITE_OTHER): Payer: BC Managed Care – PPO | Admitting: *Deleted

## 2022-01-09 VITALS — BP 137/86 | HR 78

## 2022-01-09 DIAGNOSIS — Z23 Encounter for immunization: Secondary | ICD-10-CM | POA: Diagnosis not present

## 2022-01-09 NOTE — Progress Notes (Signed)
SUBJECTIVE: Felicia Pham. Felicia Pham is a 41 yo female who presents for 3rd Gardasil injection.  OBJECTIVE:  Appears well, in no apparent distress. Vital signs are normal.  ASSESSMENT: HPV vaccine needed.  PLAN:  3rd Gardasil injection given in LD, tolerated well. Gardasil series is complete.

## 2022-01-10 NOTE — Progress Notes (Signed)
Patient was assessed and managed by nursing staff during this encounter. I have reviewed the chart and agree with the documentation and plan. I have also made any necessary editorial changes.  Griffin Basil, MD 01/10/2022 9:24 AM

## 2022-01-31 ENCOUNTER — Encounter (HOSPITAL_COMMUNITY): Payer: Self-pay

## 2022-01-31 ENCOUNTER — Ambulatory Visit (HOSPITAL_COMMUNITY)
Admission: EM | Admit: 2022-01-31 | Discharge: 2022-01-31 | Disposition: A | Discharge status: Home / Self Care | Payer: BC Managed Care – PPO | Source: Home / Self Care

## 2022-01-31 ENCOUNTER — Emergency Department (HOSPITAL_BASED_OUTPATIENT_CLINIC_OR_DEPARTMENT_OTHER)
Admission: EM | Admit: 2022-01-31 | Discharge: 2022-01-31 | Disposition: A | Discharge status: Home / Self Care | Payer: BC Managed Care – PPO | Attending: Emergency Medicine | Admitting: Emergency Medicine

## 2022-01-31 ENCOUNTER — Encounter (HOSPITAL_BASED_OUTPATIENT_CLINIC_OR_DEPARTMENT_OTHER): Payer: Self-pay

## 2022-01-31 ENCOUNTER — Other Ambulatory Visit: Payer: Self-pay

## 2022-01-31 ENCOUNTER — Emergency Department (HOSPITAL_BASED_OUTPATIENT_CLINIC_OR_DEPARTMENT_OTHER): Payer: BC Managed Care – PPO | Admitting: Radiology

## 2022-01-31 ENCOUNTER — Emergency Department (HOSPITAL_BASED_OUTPATIENT_CLINIC_OR_DEPARTMENT_OTHER): Payer: BC Managed Care – PPO

## 2022-01-31 DIAGNOSIS — R0789 Other chest pain: Secondary | ICD-10-CM | POA: Diagnosis present

## 2022-01-31 DIAGNOSIS — R220 Localized swelling, mass and lump, head: Secondary | ICD-10-CM

## 2022-01-31 DIAGNOSIS — R6889 Other general symptoms and signs: Secondary | ICD-10-CM

## 2022-01-31 DIAGNOSIS — Z1152 Encounter for screening for COVID-19: Secondary | ICD-10-CM | POA: Insufficient documentation

## 2022-01-31 DIAGNOSIS — R079 Chest pain, unspecified: Secondary | ICD-10-CM

## 2022-01-31 DIAGNOSIS — U071 COVID-19: Secondary | ICD-10-CM | POA: Diagnosis not present

## 2022-01-31 DIAGNOSIS — R590 Localized enlarged lymph nodes: Secondary | ICD-10-CM | POA: Insufficient documentation

## 2022-01-31 DIAGNOSIS — H938X1 Other specified disorders of right ear: Secondary | ICD-10-CM | POA: Insufficient documentation

## 2022-01-31 DIAGNOSIS — J069 Acute upper respiratory infection, unspecified: Secondary | ICD-10-CM

## 2022-01-31 DIAGNOSIS — J029 Acute pharyngitis, unspecified: Secondary | ICD-10-CM

## 2022-01-31 DIAGNOSIS — R000 Tachycardia, unspecified: Secondary | ICD-10-CM | POA: Diagnosis not present

## 2022-01-31 LAB — SARS CORONAVIRUS 2 BY RT PCR: SARS Coronavirus 2 by RT PCR: POSITIVE — AB

## 2022-01-31 LAB — BASIC METABOLIC PANEL
Anion gap: 7 (ref 5–15)
BUN: 7 mg/dL (ref 6–20)
CO2: 23 mmol/L (ref 22–32)
Calcium: 9.5 mg/dL (ref 8.9–10.3)
Chloride: 103 mmol/L (ref 98–111)
Creatinine, Ser: 0.91 mg/dL (ref 0.44–1.00)
GFR, Estimated: >60 mL/min (ref >60)
Glucose, Bld: 114 mg/dL — ABNORMAL HIGH (ref 70–99)
Potassium: 3.5 mmol/L (ref 3.5–5.1)
Sodium: 133 mmol/L — ABNORMAL LOW (ref 135–145)

## 2022-01-31 LAB — CBC
HCT: 32.2 % — ABNORMAL LOW (ref 36.0–46.0)
Hemoglobin: 10.7 g/dL — ABNORMAL LOW (ref 12.0–15.0)
MCH: 26.8 pg (ref 26.0–34.0)
MCHC: 33.2 g/dL (ref 30.0–36.0)
MCV: 80.7 fL (ref 80.0–100.0)
Platelets: 296 10*3/uL (ref 150–400)
RBC: 3.99 MIL/uL (ref 3.87–5.11)
RDW: 15.2 % (ref 11.5–15.5)
WBC: 4.5 10*3/uL (ref 4.0–10.5)
nRBC: 0 % (ref 0.0–0.2)

## 2022-01-31 LAB — RESP PANEL BY RT-PCR (FLU A&B, COVID) ARPGX2
Influenza A by PCR: NEGATIVE
Influenza B by PCR: NEGATIVE
SARS Coronavirus 2 by RT PCR: POSITIVE — AB

## 2022-01-31 LAB — POCT RAPID STREP A, ED / UC: Streptococcus, Group A Screen (Direct): NEGATIVE

## 2022-01-31 LAB — TROPONIN I (HIGH SENSITIVITY)
Troponin I (High Sensitivity): <2 ng/L (ref <18)
Troponin I (High Sensitivity): <2 ng/L (ref <18)

## 2022-01-31 LAB — PREGNANCY, URINE: Preg Test, Ur: NEGATIVE

## 2022-01-31 MED ORDER — IOHEXOL 350 MG/ML SOLN
100.0000 mL | Freq: Once | INTRAVENOUS | Status: AC | PRN
  Administered 2022-01-31: 80 mL via INTRAVENOUS

## 2022-01-31 MED ORDER — DIPHENHYDRAMINE HCL 25 MG PO CAPS
50.0000 mg | ORAL_CAPSULE | Freq: Once | ORAL | Status: AC
  Administered 2022-01-31: 50 mg via ORAL
  Filled 2022-01-31: qty 2

## 2022-01-31 MED ORDER — ACETAMINOPHEN 500 MG PO TABS
1000.0000 mg | ORAL_TABLET | Freq: Once | ORAL | Status: AC
  Administered 2022-01-31: 1000 mg via ORAL
  Filled 2022-01-31: qty 2

## 2022-01-31 NOTE — ED Triage Notes (Signed)
Pt c/o nasal/head congestion, ear pain, swollen face, and sore throat since last night. States took tylenol today with no relief.

## 2022-01-31 NOTE — Discharge Instructions (Addendum)
Patient advised to the report to the emergency room for evaluation of the acute onset right facial swelling.

## 2022-01-31 NOTE — ED Triage Notes (Addendum)
Pt reports nasal congestion, sore throat, and R sided facial swelling since last night. Pt also endorses cough and central CP since today. H/x PE.

## 2022-01-31 NOTE — ED Provider Notes (Signed)
[place]    CSN: [id number] Arrival date & time: 01/31/22  1337      History   Chief Complaint Chief Complaint  Patient presents with   Nasal Congestion    HPI KAMYAH WILHELMSEN is a 41 y.o. female.   41 year old female presents with sore throat and facial swelling.  Patient indicates yesterday she started with mild cough, congestion, sinus congestion with postnasal drip, and sore throat.  Patient indicates she is having some moderate pain with swallowing and that throat is sore.  She also indicates that she is having right ear pain and discomfort and this morning she noticed that the right side of her face started swelling.  She also indicates that she started having a mild fever today.  She denies any numbness or tingling of the right side of the face.  And she denies having any tooth pain or sinus pain.     Past Medical History:  Diagnosis Date   DVT (deep venous thrombosis) (HCC)    PE (pulmonary thromboembolism) ([place])    Rib fracture    Uterine fibroid     Patient Active Problem List   Diagnosis Date Noted   Chest pain of uncertain etiology 96/29/5284   History of pulmonary embolism/DVT 09/23/2021   Pulmonary embolism ([place]) 09/05/2020   Obesity, Class III, BMI 40-49.9 (morbid obesity) ([place]) 09/05/2020   Goiter 03/30/2018   Body mass index 37.0-37.9, adult 03/30/2018   Mild dysplasia of cervix 01/05/2014   Papanicolaou smear of cervix with low grade squamous intraepithelial lesion (LGSIL) 12/12/2013   Other iron deficiency anemias 02/25/2013   Other and unspecified ovarian cyst 11/02/2012   Leiomyoma of uterus, unspecified 08/05/2012    Past Surgical History:  Procedure Laterality Date   MYOMECTOMY  2014    OB History     Gravida  0   Para  0   Term  0   Preterm  0   AB  0   Living  0      SAB  0   IAB  0   Ectopic  0   Multiple  0   Live Births               Home Medications    Prior to Admission medications    Medication Sig Start Date End Date Taking? Authorizing Provider  acetaminophen (TYLENOL) 325 MG tablet Take 2 tablets (650 mg total) by mouth every 6 (six) hours as needed for headache or mild pain. 09/06/20   Allie Bossier, MD  cetirizine-pseudoephedrine (ZYRTEC-D) 5-120 MG tablet Take 1 tablet by mouth daily. 10/07/20   Faustino Congress, NP  Cholecalciferol (VITAMIN D3) 50 MCG (2000 UT) CAPS Take 4,000 Units by mouth daily.    [provider]  ketoconazole (NIZORAL) 2 % cream Apply 1 application  topically 2 (two) times daily. 08/13/20   [provider]  ketoconazole (NIZORAL) 2 % shampoo Apply 1 application  topically every 14 (fourteen) days. 08/13/20   [provider]  Multiple Vitamins-Minerals (MULTIVITAMIN WITH MINERALS) tablet Take 1 tablet by mouth daily.    [provider]  spironolactone (ALDACTONE) 25 MG tablet Take 25 mg by mouth daily as needed. 03/03/21   [provider]    Family History Family History  Problem Relation Age of Onset   Lupus Mother    Hypertension Mother    Hypertension Father    Diabetes Father    Heart attack Father 61   Diabetes Maternal Grandmother  Diabetes Paternal Grandmother     Social History Social History   Tobacco Use   Smoking status: Never   Smokeless tobacco: Never  Vaping Use   Vaping Use: Never used  Substance Use Topics   Alcohol use: No   Drug use: No     Allergies   Patient has no known allergies.   Review of Systems Review of Systems  HENT:  Positive for facial swelling (right side of face).      Physical Exam Triage Vital Signs ED Triage Vitals  Enc Vitals Group     BP 01/31/22 1459 139/83     Pulse Rate 01/31/22 1459 (!) 103     Resp 01/31/22 1459 18     Temp 01/31/22 1459 99.5 F (37.5 C)     Temp Source 01/31/22 1459 Oral     SpO2 01/31/22 1459 97 %     Weight --      Height --      Head Circumference --      Peak Flow --      Pain Score 01/31/22 1500  8     Pain Loc --      Pain Edu? --      Excl. in [place]? --    No data found.  Updated Vital Signs BP 139/83 (BP Location: Left Arm)   Pulse (!) 103   Temp 99.5 F (37.5 C) (Oral)   Resp 18   LMP 01/27/2022   SpO2 97%   Visual Acuity Right Eye Distance:   Left Eye Distance:   Bilateral Distance:    Right Eye Near:   Left Eye Near:    Bilateral Near:     Physical Exam Constitutional:      Appearance: Normal appearance.  HENT:     Right Ear: Ear canal normal. Tympanic membrane is injected.     Left Ear: Tympanic membrane and ear canal normal.     Mouth/Throat:     Mouth: Mucous membranes are moist.     Pharynx: Oropharynx is clear. Posterior oropharyngeal erythema present. No pharyngeal swelling or oropharyngeal exudate.     Comments: Face: The right side of the face is swollen 1+ as compared to the left.  There is no tenderness on palpation of the frontal or maxillary sinus on the right, and no redness associated with the swelling.  The left side of the face appears normal. Neurological:     Mental Status: She is alert.      UC Treatments / Results  Labs (all labs ordered are listed, but only abnormal results are displayed) Labs Reviewed  RESP PANEL BY RT-PCR (FLU A&B, COVID) ARPGX2  CULTURE, GROUP A STREP [facility])  POCT RAPID STREP A, ED / UC    EKG   Radiology No results found.  Procedures Procedures (including critical care time)  Medications Ordered in UC Medications - No data to display  Initial Impression / Assessment and Plan / UC Course  I have reviewed the triage vital signs and the nursing notes.  Pertinent labs & imaging results that were available during my care of the patient were reviewed by me and considered in my medical decision making (see chart for details).    Plan: 1.  The right facial swelling will be treated with the following: A.  Patient advised to report to the emergency room for evaluation of the facial swelling. 2.  The  acute pharyngitis will be treated with the following: A.  Patient advised to  report to the emergency room for evaluation of facial swelling and pharyngitis. B.  Throat culture is pending 3.  The [place] for COVID-19 will be treated with the following: A.  Treatment will depend on the results of the COVID test. 4.  The upper respiratory infection will be treated with the following: A.  Advised patient to treat symptomatically using Mucinex DM for congestion. 5.  Advised follow-up PCP or return to urgent care as needed. Final Clinical Impressions(s) / UC Diagnoses   Final diagnoses:  Viral upper respiratory tract infection  Acute pharyngitis, unspecified etiology  Right facial swelling  Encounter for screening for COVID-19     Discharge Instructions      Patient advised to the report to the emergency room for evaluation of the acute onset right facial swelling.    ED Prescriptions   None    PDMP not reviewed this encounter.   Nyoka Lint, PA-C 01/31/22 1538

## 2022-01-31 NOTE — Discharge Instructions (Signed)
Take tylenol and motrin every 6 hrs as needed.  Work note for Monday. Return for difficulty breathing or new concerns.

## 2022-01-31 NOTE — ED Provider Notes (Signed)
[place] [facility] EMERGENCY DEPT Provider Note   CSN: [id number] Arrival date & time: 01/31/22  1631     History {Add pertinent medical, surgical, social history, OB history to HPI:1} Chief Complaint  Patient presents with   Chest Pain   Cough    Felicia Pham is a 41 y.o. female.  Patient presents with cough, shortness of breath, chest discomfort essentially with coughing and sore throat worsening since yesterday.  Patient is also had some mild swelling in right ear discomfort.  Patient seen urgent care and sent over for further evaluation.  Patient had a blood clot in the past likely secondary to birth control not currently on birth control but also not currently on anticoagulants.  Low-grade fever today.  No cardiac history.       Home Medications Prior to Admission medications   Medication Sig Start Date End Date Taking? Authorizing Provider  acetaminophen (TYLENOL) 325 MG tablet Take 2 tablets (650 mg total) by mouth every 6 (six) hours as needed for headache or mild pain. 09/06/20   Allie Bossier, MD  cetirizine-pseudoephedrine (ZYRTEC-D) 5-120 MG tablet Take 1 tablet by mouth daily. 10/07/20   Faustino Congress, NP  Cholecalciferol (VITAMIN D3) 50 MCG (2000 UT) CAPS Take 4,000 Units by mouth daily.    [provider]  ketoconazole (NIZORAL) 2 % cream Apply 1 application  topically 2 (two) times daily. 08/13/20   [provider]  ketoconazole (NIZORAL) 2 % shampoo Apply 1 application  topically every 14 (fourteen) days. 08/13/20   [provider]  Multiple Vitamins-Minerals (MULTIVITAMIN WITH MINERALS) tablet Take 1 tablet by mouth daily.    [provider]  spironolactone (ALDACTONE) 25 MG tablet Take 25 mg by mouth daily as needed. 03/03/21   [provider]      Allergies    Taytulla [norethin ace-eth estrad-fe]    Review of Systems   Review of Systems  Constitutional:  Positive for fever. Negative for chills.   HENT:  Positive for congestion.   Eyes:  Negative for visual disturbance.  Respiratory:  Positive for cough and shortness of breath.   Cardiovascular:  Negative for chest pain.  Gastrointestinal:  Negative for abdominal pain and vomiting.  Genitourinary:  Negative for dysuria and flank pain.  Musculoskeletal:  Negative for back pain, neck pain and neck stiffness.  Skin:  Negative for rash.  Neurological:  Negative for light-headedness and headaches.    Physical Exam Updated Vital Signs BP 107/87   Pulse (!) 51   Temp (!) 100.9 F (38.3 C) (Oral)   Resp 16   Ht '5\' 4"'$  (1.626 m)   Wt 125.2 kg   LMP 01/27/2022   SpO2 96%   BMI 47.38 kg/m  Physical Exam Vitals and nursing note reviewed.  Constitutional:      General: She is not in acute distress.    Appearance: She is well-developed.  HENT:     Head: Normocephalic and atraumatic.     Comments: Patient has mild right ear effusion, no mastoid tenderness, no trismus.  No dental infection clinically no peritonsillar abscess.  Neck supple no meningismus.  Mild right anterior cervical adenopathy and mild right lateral facial swelling.  No cellulitis clinically.  No parotid tenderness.    Mouth/Throat:     Mouth: Mucous membranes are moist.  Eyes:     General:        Right eye: No discharge.        Left eye: No discharge.  Conjunctiva/sclera: Conjunctivae normal.  Cardiovascular:     Rate and Rhythm: Regular rhythm. Tachycardia present.     Heart sounds: No murmur heard. Pulmonary:     Effort: Pulmonary effort is normal.     Breath sounds: Normal breath sounds.  Abdominal:     General: There is no distension.     Palpations: Abdomen is soft.     Tenderness: There is no abdominal tenderness. There is no guarding.  Musculoskeletal:     Cervical back: Normal range of motion and neck supple. No rigidity.  Skin:    General: Skin is warm.     Capillary Refill: Capillary refill takes less than 2 seconds.     Findings: No rash.   Neurological:     General: No focal deficit present.     Mental Status: She is alert.     Cranial Nerves: No cranial nerve deficit.  Psychiatric:        Mood and Affect: Mood normal.     ED Results / Procedures / Treatments   Labs (all labs ordered are listed, but only abnormal results are displayed) Labs Reviewed  BASIC METABOLIC PANEL - Abnormal; Notable for the following components:      Result Value   Sodium 133 (*)    Glucose, Bld 114 (*)    All other components within normal limits  CBC - Abnormal; Notable for the following components:   Hemoglobin 10.7 (*)    HCT 32.2 (*)    All other components within normal limits  PREGNANCY, URINE  TROPONIN I (HIGH SENSITIVITY)  TROPONIN I (HIGH SENSITIVITY)    EKG EKG Interpretation  Date/Time:  Friday January 31 2022 16:41:29 EDT Ventricular Rate:  122 PR Interval:  120 QRS Duration: 72 QT Interval:  290 QTC Calculation: 413 R Axis:   42 Text Interpretation: Sinus tachycardia Septal infarct , age undetermined Abnormal ECG When compared with ECG of 23-Sep-2021 05:36, PREVIOUS ECG IS PRESENT Confirmed by Elnora Morrison [phone]) on 01/31/2022 7:27:12 PM  Radiology DG Chest 2 View  Result Date: 01/31/2022 CLINICAL DATA:  Chest pain EXAM: CHEST - 2 VIEW COMPARISON:  09/23/2021 FINDINGS: The heart size and mediastinal contours are within normal limits. Both lungs are clear. The visualized skeletal structures are unremarkable. IMPRESSION: No active cardiopulmonary disease. Electronically Signed   By: Donavan Foil M.D.   On: 01/31/2022 17:38    Procedures Procedures  {Document cardiac monitor, telemetry assessment procedure when appropriate:1}  Medications Ordered in ED Medications  acetaminophen (TYLENOL) tablet 1,000 mg (has no administration in time range)  diphenhydrAMINE (BENADRYL) capsule 50 mg (50 mg Oral Given 01/31/22 2029)  iohexol (OMNIPAQUE) 350 MG/ML injection 100 mL (80 mLs Intravenous Contrast Given 01/31/22  2031)    ED Course/ Medical Decision Making/ A&P                           Medical Decision Making Amount and/or Complexity of Data Reviewed Labs: ordered. Radiology: ordered.  Risk OTC drugs. Prescription drug management.   Patient presents with clinical concern for flulike illness however with chest pain differential broadened to musculoskeletal, ACS however less likely given age and flu symptoms, myocarditis less likely given normal blood pressures/no murmur/well-appearing otherwise and heart rate improved, pulmonary embolism on differential given history of however patient no longer has that risk factor and has other more likely diagnosis but will obtain CT angiogram to ensure prior to discharge.  Troponin negative, patient's heart rate improved  and normalized in the ED.  Oral fluids, Tylenol for body aches and fever.  Discussed close outpatient follow-up and reasons to return.  Patient viral testing done urgent care will have results in 2 days for flu/COVID.    {Document critical care time when appropriate:1} {Document review of labs and clinical decision tools ie heart score, Chads2Vasc2 etc:1}  {Document your independent review of radiology images, and any outside records:1} {Document your discussion with family members, caretakers, and with consultants:1} {Document social determinants of health affecting pt's care:1} {Document your decision making why or why not admission, treatments were needed:1} Final Clinical Impression(s) / ED Diagnoses Final diagnoses:  Acute chest pain  Flu-like symptoms    Rx / DC Orders ED Discharge Orders     None

## 2022-01-31 NOTE — ED Notes (Signed)
Patient is being discharged from the Urgent Care and sent to the Emergency Department via POV . Per Shanon Brow, [place], patient is in need of higher level of care due to need of further evaluation. Patient is aware and verbalizes understanding of plan of care.  Vitals:   01/31/22 1459  BP: 139/83  Pulse: (!) 103  Resp: 18  Temp: 99.5 F (37.5 C)  SpO2: 97%

## 2022-02-03 LAB — CULTURE, GROUP A STREP (THRC)

## 2022-04-18 ENCOUNTER — Encounter (HOSPITAL_COMMUNITY): Payer: Self-pay | Admitting: *Deleted

## 2022-04-18 ENCOUNTER — Ambulatory Visit (HOSPITAL_COMMUNITY)
Admission: EM | Admit: 2022-04-18 | Discharge: 2022-04-18 | Disposition: A | Discharge status: Home / Self Care | Payer: BC Managed Care – PPO | Attending: Emergency Medicine | Admitting: Emergency Medicine

## 2022-04-18 DIAGNOSIS — J101 Influenza due to other identified influenza virus with other respiratory manifestations: Secondary | ICD-10-CM | POA: Diagnosis not present

## 2022-04-18 DIAGNOSIS — Z20822 Contact with and (suspected) exposure to covid-19: Secondary | ICD-10-CM | POA: Diagnosis not present

## 2022-04-18 DIAGNOSIS — J0140 Acute pansinusitis, unspecified: Secondary | ICD-10-CM | POA: Diagnosis not present

## 2022-04-18 LAB — POC INFLUENZA A AND B ANTIGEN (URGENT CARE ONLY)
INFLUENZA A ANTIGEN, POC: POSITIVE — AB
INFLUENZA B ANTIGEN, POC: NEGATIVE

## 2022-04-18 MED ORDER — IPRATROPIUM BROMIDE 0.06 % NA SOLN: 2.0000 | Freq: Four times a day (QID) | NASAL | 0 refills | Status: AC

## 2022-04-18 MED ORDER — IBUPROFEN 600 MG PO TABS: 600.0000 mg | ORAL_TABLET | Freq: Four times a day (QID) | ORAL | 0 refills | Status: DC | PRN

## 2022-04-18 MED ORDER — AMOXICILLIN-POT CLAVULANATE 875-125 MG PO TABS: 1.0000 | ORAL_TABLET | Freq: Two times a day (BID) | ORAL | 0 refills | Status: DC

## 2022-04-18 MED ORDER — PROMETHAZINE-DM 6.25-15 MG/5ML PO SYRP: 5.0000 mL | ORAL_SOLUTION | Freq: Four times a day (QID) | ORAL | 0 refills | Status: DC | PRN

## 2022-04-18 NOTE — Discharge Instructions (Signed)
I will contact you if and only if your COVID or flu come back positive.  I will prescribe molnupiravir if your COVID is positive.  Unfortunately, you are out of the treatment window for influenza.  Start Mucinex-D to keep the mucous thin and to decongest you.   You may take 600 mg of motrin with 1000 mg of tylenol up to 3-4 times a day as needed for pain. This is an effective combination for pain.  Most sinus infections are viral and do not need antibiotics unless you have a high fever, have had this for 10 days, or you get better and then get sick again. Use a NeilMed sinus rinse with distilled water as often as you want to to reduce nasal congestion. Follow the directions on the box.  Atrovent nasal spray will help dry up nasal congestion and postnasal drip.  Promethazine DM as needed for cough.  Go to www.goodrx.com to look up your medications. This will give you a list of where you can find your prescriptions at the most affordable prices. Or you can ask the pharmacist what the cash price is. This is frequently cheaper than going through insurance.

## 2022-04-18 NOTE — ED Triage Notes (Signed)
Pt states since Tuesday she has had chills, congestion, sore throat, body aches, and cough. She has been taking mucinex.

## 2022-04-18 NOTE — ED Provider Notes (Addendum)
HPI  SUBJECTIVE:  Felicia Pham is a 42 y.o. female who presents with 3 to 4 days of right ear pain, clear nasal congestion, rhinorrhea, maxillary sinus pain and pressure, upper dental pain, sore throat due to postnasal drip, postnasal drip, cough productive of the same material as her nasal congestion and occasional nausea.  No fevers, facial swelling, wheezing, shortness of breath, vomiting, diarrhea, abdominal pain.  No known COVID or flu exposure.  She got 2 doses of the COVID-vaccine and this years flu vaccine.  She is able to sleep at night without waking up coughing.  No antibiotics in the past month.  No antipyretic in the past 6 hours.  She has tried Mucinex and Robitussin.  The Robitussin seems to help.  No aggravating factors.  She has a past medical history of PE/DVT, anticoagulants discontinued last year.  She had COVID in 10/23.  LMP: December 11.  Denies possibility pregnant.  [facility]    Past Medical History:  Diagnosis Date   DVT (deep venous thrombosis) (HCC)    PE (pulmonary thromboembolism) ([place])    Rib fracture    Uterine fibroid     Past Surgical History:  Procedure Laterality Date   MYOMECTOMY  2014   MYOMECTOMY ABDOMINAL APPROACH  10/2021    Family History  Problem Relation Age of Onset   Lupus Mother    Hypertension Mother    Hypertension Father    Diabetes Father    Heart attack Father 61   Diabetes Maternal Grandmother    Diabetes Paternal Grandmother     Social History   Tobacco Use   Smoking status: Never   Smokeless tobacco: Never  Vaping Use   Vaping Use: Never used  Substance Use Topics   Alcohol use: No   Drug use: No    No current facility-administered medications for this encounter.  Current Outpatient Medications:    amoxicillin-clavulanate (AUGMENTIN) 875-125 MG tablet, Take 1 tablet by mouth every 12 (twelve) hours., Disp: 14 tablet, Rfl: 0   Cholecalciferol (VITAMIN D3) 50 MCG (2000 UT) CAPS, Take 4,000 Units by  mouth daily., Disp: , Rfl:    ibuprofen (ADVIL) 600 MG tablet, Take 1 tablet (600 mg total) by mouth every 6 (six) hours as needed., Disp: 30 tablet, Rfl: 0   ipratropium (ATROVENT) 0.06 % nasal spray, Place 2 sprays into both nostrils 4 (four) times daily., Disp: 15 mL, Rfl: 0   ketoconazole (NIZORAL) 2 % cream, Apply 1 application  topically 2 (two) times daily., Disp: , Rfl:    ketoconazole (NIZORAL) 2 % shampoo, Apply 1 application  topically every 14 (fourteen) days., Disp: , Rfl:    promethazine-dextromethorphan (PROMETHAZINE-DM) 6.25-15 MG/5ML syrup, Take 5 mLs by mouth 4 (four) times daily as needed for cough., Disp: 118 mL, Rfl: 0   spironolactone (ALDACTONE) 25 MG tablet, Take 25 mg by mouth daily as needed., Disp: , Rfl:    acetaminophen (TYLENOL) 325 MG tablet, Take 2 tablets (650 mg total) by mouth every 6 (six) hours as needed for headache or mild pain., Disp: 60 tablet, Rfl: 0   Multiple Vitamins-Minerals (MULTIVITAMIN WITH MINERALS) tablet, Take 1 tablet by mouth daily., Disp: , Rfl:   Allergies  Allergen Reactions   Taytulla [Norethin Ace-Eth Estrad-Fe]     Pt unable to take any OCP due to pulmonary embolus.     ROS  As noted in HPI.   Physical Exam  BP (!) 129/90 (BP Location: Left Arm)   Pulse  100   Temp 98.2 F (36.8 C) (Oral)   Resp 18   LMP 03/24/2022 (Approximate)   SpO2 95%   Constitutional: Well developed, well nourished, no acute distress Eyes:  EOMI, conjunctiva normal bilaterally HENT: Normocephalic, atraumatic,mucus membranes moist.  TMs normal bilaterally.  Erythematous, swollen turbinates with mucoid nasal congestion.  Positive maxillary, frontal sinus tenderness.  Normal tonsils without exudates.  Normal oropharynx, uvula midline. Neck: Positive shotty cervical lymphadenopathy Respiratory: Normal inspiratory effort lungs clear bilaterally.  No anterior, lateral chest wall tenderness Cardiovascular: Borderline regular tachycardia, no murmurs rubs or  gallops GI: nondistended skin: No rash, skin intact Musculoskeletal: no deformities Neurologic: Alert & oriented x 3, no focal neuro deficits Psychiatric: Speech and behavior appropriate  ED Course   Medications - No data to display  Orders Placed This Encounter  Procedures   SARS CORONAVIRUS 2 (TAT 6-24 HRS) Anterior Nasal Swab    Standing Status:   Standing    Number of Occurrences:   1   Droplet precaution    Standing Status:   Standing    Number of Occurrences:   1   POC Influenza A & B Ag (Urgent Care)    Standing Status:   Standing    Number of Occurrences:   1    Results for orders placed or performed during the hospital encounter of 04/18/22 (from the past 24 hour(s))  POC Influenza A & B Ag (Urgent Care)     Status: Abnormal   Collection Time: 04/18/22 11:28 AM  Result Value Ref Range   INFLUENZA A ANTIGEN, POC POSITIVE (A) NEGATIVE   INFLUENZA B ANTIGEN, POC NEGATIVE NEGATIVE   No results found.  ED Clinical Impression  1. Influenza A   2. Acute non-recurrent pansinusitis   3. Encounter for laboratory testing for COVID-19 virus      ED Assessment/Plan   Patient presents with an acute illness with systemic symptoms of tachycardia.  Checking COVID.  Will treat with molnupiravir if COVID is positive.  Also checking flu per patient request.  Unfortunately, she is out of the treatment window for influenza.  Discussed this with patient.  Influenza A positive.  Discussed this with patient.  She also has acute pansinusitis from the influenza.  Will have her change from Mucinex to Mucinex D, start Atrovent nasal spray, saline nasal irrigation, Tylenol/ibuprofen, Promethazine DM.  Wait-and-see prescription of Augmentin as she is reporting upper dental pain.  Discussed indications for starting the Augmentin.  Follow-up with PCP as needed.  Discussed labs, MDM, treatment plan, and plan for follow-up with patient.  patient agrees with plan.   Meds ordered this  encounter  Medications   ipratropium (ATROVENT) 0.06 % nasal spray    Sig: Place 2 sprays into both nostrils 4 (four) times daily.    Dispense:  15 mL    Refill:  0   promethazine-dextromethorphan (PROMETHAZINE-DM) 6.25-15 MG/5ML syrup    Sig: Take 5 mLs by mouth 4 (four) times daily as needed for cough.    Dispense:  118 mL    Refill:  0   ibuprofen (ADVIL) 600 MG tablet    Sig: Take 1 tablet (600 mg total) by mouth every 6 (six) hours as needed.    Dispense:  30 tablet    Refill:  0   amoxicillin-clavulanate (AUGMENTIN) 875-125 MG tablet    Sig: Take 1 tablet by mouth every 12 (twelve) hours.    Dispense:  14 tablet    Refill:  0      *  This clinic note was created using [employer]. Therefore, there may be occasional mistakes despite careful proofreading.  ?    Melynda Ripple, MD 04/18/22 1135    Melynda Ripple, MD 04/18/22 [phone]

## 2022-04-19 LAB — SARS CORONAVIRUS 2 (TAT 6-24 HRS): SARS Coronavirus 2: NEGATIVE

## 2022-06-05 ENCOUNTER — Encounter: Payer: Self-pay | Admitting: Obstetrics

## 2022-06-05 ENCOUNTER — Ambulatory Visit (INDEPENDENT_AMBULATORY_CARE_PROVIDER_SITE_OTHER): Payer: BC Managed Care – PPO | Admitting: Obstetrics

## 2022-06-05 ENCOUNTER — Other Ambulatory Visit (HOSPITAL_COMMUNITY)
Admission: RE | Admit: 2022-06-05 | Discharge: 2022-06-05 | Disposition: A | Discharge status: Home / Self Care | Payer: BC Managed Care – PPO | Source: Ambulatory Visit | Attending: Obstetrics | Admitting: Obstetrics

## 2022-06-05 VITALS — BP 125/79 | HR 95 | Ht 64.0 in | Wt 267.0 lb

## 2022-06-05 DIAGNOSIS — Z6841 Body Mass Index (BMI) 40.0 and over, adult: Secondary | ICD-10-CM

## 2022-06-05 DIAGNOSIS — D219 Benign neoplasm of connective and other soft tissue, unspecified: Secondary | ICD-10-CM

## 2022-06-05 DIAGNOSIS — N898 Other specified noninflammatory disorders of vagina: Secondary | ICD-10-CM

## 2022-06-05 DIAGNOSIS — Z01419 Encounter for gynecological examination (general) (routine) without abnormal findings: Secondary | ICD-10-CM

## 2022-06-05 DIAGNOSIS — Z9889 Other specified postprocedural states: Secondary | ICD-10-CM | POA: Diagnosis not present

## 2022-06-05 DIAGNOSIS — Z113 Encounter for screening for infections with a predominantly sexual mode of transmission: Secondary | ICD-10-CM

## 2022-06-05 NOTE — Progress Notes (Signed)
Subjective:        Felicia Pham is a 42 y.o. female here for a routine exam.  Current complaints: Vaginal discharge.    Personal health questionnaire:  Is patient [name], have a family history of breast and/or ovarian cancer: no Is there a family history of uterine cancer diagnosed at age < 71, gastrointestinal cancer, urinary tract cancer, family member who is a Field seismologist syndrome-associated carrier: no Is the patient overweight and hypertensive, family history of diabetes, personal history of gestational diabetes, preeclampsia or PCOS: no Is patient over 84, have PCOS,  family history of premature CHD under age 68, diabetes, smoke, have hypertension or peripheral artery disease:  no At any time, has a partner hit, kicked or otherwise hurt or frightened you?: no Over the past 2 weeks, have you felt down, depressed or hopeless?: no Over the past 2 weeks, have you felt little interest or pleasure in doing things?:no   Gynecologic History Patient's last menstrual period was 05/23/2022 (exact date). Contraception: condoms Last Pap: 2023. Results were: LGSIL Last mammogram: 04-01-2022. Results were: normal  Obstetric History OB History  Gravida Para Term Preterm AB Living  0 0 0 0 0 0  SAB IAB Ectopic Multiple Live Births  0 0 0 0      Past Medical History:  Diagnosis Date   DVT (deep venous thrombosis) (HCC)    PE (pulmonary thromboembolism) ([place])    Rib fracture    Uterine fibroid     Past Surgical History:  Procedure Laterality Date   MYOMECTOMY  2014   MYOMECTOMY ABDOMINAL APPROACH  10/2021     Current Outpatient Medications:    acetaminophen (TYLENOL) 325 MG tablet, Take 2 tablets (650 mg total) by mouth every 6 (six) hours as needed for headache or mild pain., Disp: 60 tablet, Rfl: 0   amoxicillin-clavulanate (AUGMENTIN) 875-125 MG tablet, Take 1 tablet by mouth every 12 (twelve) hours. (Patient not taking: Reported on 06/05/2022), Disp: 14 tablet, Rfl:  0   Cholecalciferol (VITAMIN D3) 50 MCG (2000 UT) CAPS, Take 4,000 Units by mouth daily., Disp: , Rfl:    ibuprofen (ADVIL) 600 MG tablet, Take 1 tablet (600 mg total) by mouth every 6 (six) hours as needed., Disp: 30 tablet, Rfl: 0   ipratropium (ATROVENT) 0.06 % nasal spray, Place 2 sprays into both nostrils 4 (four) times daily., Disp: 15 mL, Rfl: 0   ketoconazole (NIZORAL) 2 % cream, Apply 1 application  topically 2 (two) times daily., Disp: , Rfl:    ketoconazole (NIZORAL) 2 % shampoo, Apply 1 application  topically every 14 (fourteen) days., Disp: , Rfl:    Multiple Vitamins-Minerals (MULTIVITAMIN WITH MINERALS) tablet, Take 1 tablet by mouth daily., Disp: , Rfl:    promethazine-dextromethorphan (PROMETHAZINE-DM) 6.25-15 MG/5ML syrup, Take 5 mLs by mouth 4 (four) times daily as needed for cough., Disp: 118 mL, Rfl: 0   spironolactone (ALDACTONE) 25 MG tablet, Take 25 mg by mouth daily as needed., Disp: , Rfl:  Allergies  Allergen Reactions   Taytulla [Norethin Ace-Eth Estrad-Fe]     Pt unable to take any OCP due to pulmonary embolus.    Social History   Tobacco Use   Smoking status: Never   Smokeless tobacco: Never  Substance Use Topics   Alcohol use: No    Family History  Problem Relation Age of Onset   Lupus Mother    Hypertension Mother    Hypertension Father    Diabetes Father  Heart attack Father 6   Diabetes Maternal Grandmother    Diabetes Paternal Grandmother       Review of Systems  Constitutional: negative for fatigue and weight loss Respiratory: negative for cough and wheezing Cardiovascular: negative for chest pain, fatigue and palpitations Gastrointestinal: negative for abdominal pain and change in bowel habits Musculoskeletal:negative for myalgias Neurological: negative for gait problems and tremors Behavioral/Psych: negative for abusive relationship, depression Endocrine: negative for temperature intolerance    Genitourinary:negative for abnormal  menstrual periods, genital lesions, hot flashes, sexual problems and vaginal discharge Integument/breast: negative for breast lump, breast tenderness, nipple discharge and skin lesion(s)    Objective:       BP 125/79   Pulse 95   Ht 5' 4"$  (1.626 m)   Wt 267 lb (121.1 kg)   LMP 05/23/2022 (Exact Date)   BMI 45.83 kg/m  General:   alert  Skin:   no rash or abnormalities  Lungs:   clear to auscultation bilaterally  Heart:   regular rate and rhythm, S1, S2 normal, no murmur, click, rub or gallop  Breasts:   normal without suspicious masses, skin or nipple changes or axillary nodes  Abdomen:  normal findings: no organomegaly, soft, non-tender and no hernia  Pelvis:  External genitalia: normal general appearance Urinary system: urethral meatus normal and bladder without fullness, nontender Vaginal: normal without tenderness, induration or masses Cervix: normal appearance Adnexa: normal bimanual exam Uterus: anteverted and non-tender, normal size   Lab Review Urine pregnancy test Labs reviewed yes Radiologic studies reviewed yes  I have spent a total of 20 minutes of face-to-face time, excluding clinical staff time, reviewing notes and preparing to see patient, ordering tests and/or medications, and counseling the patient.   Assessment:    1. Encounter for gynecological examination with Papanicolaou smear of cervix Rx: - Cytology - PAP( [place]) - CBC  2. Fibroids  3. Status post myomectomy - doing well  4. Vaginal discharge Rx: - Cervicovaginal ancillary only( [place])  5. Screening for STD (sexually transmitted disease) Rx: - HIV antibody (with reflex) - Hepatitis C Antibody - RPR - Hepatitis B Surface AntiGEN  6. Class 3 severe obesity due to excess calories without serious comorbidity with body mass index (BMI) of 45.0 to 49.9 in adult ([place]) - weight reduction recommended     Plan:    Education reviewed: calcium supplements, depression evaluation,  low fat, low cholesterol diet, safe sex/STD prevention, self breast exams, and weight bearing exercise. Contraception: condoms. Follow up in: 1 year.    Orders Placed This Encounter  Procedures   HIV antibody (with reflex)   Hepatitis C Antibody   RPR   Hepatitis B Surface AntiGEN   CBC    Shelly Bombard, MD 06/05/2022 4:02 PM

## 2022-06-05 NOTE — Progress Notes (Addendum)
42 y.o. GYN presents AEX/PAP/STD screening.   Last Mammogram 04/01/2022

## 2022-06-06 LAB — CERVICOVAGINAL ANCILLARY ONLY
Bacterial Vaginitis (gardnerella): NEGATIVE
Candida Glabrata: NEGATIVE
Candida Vaginitis: NEGATIVE
Chlamydia: NEGATIVE
Comment: NEGATIVE
Comment: NEGATIVE
Comment: NEGATIVE
Comment: NEGATIVE
Comment: NEGATIVE
Comment: NORMAL
Neisseria Gonorrhea: NEGATIVE
Trichomonas: NEGATIVE

## 2022-06-06 LAB — CBC
Hematocrit: 33.9 % — ABNORMAL LOW (ref 34.0–46.6)
Hemoglobin: 11.3 g/dL (ref 11.1–15.9)
MCH: 27.6 pg (ref 26.6–33.0)
MCHC: 33.3 g/dL (ref 31.5–35.7)
MCV: 83 fL (ref 79–97)
Platelets: 333 10*3/uL (ref 150–450)
RBC: 4.09 x10E6/uL (ref 3.77–5.28)
RDW: 14.8 % (ref 11.7–15.4)
WBC: 7.5 10*3/uL (ref 3.4–10.8)

## 2022-06-06 LAB — HIV ANTIBODY (ROUTINE TESTING W REFLEX): HIV Screen 4th Generation wRfx: NONREACTIVE

## 2022-06-06 LAB — HEPATITIS B SURFACE ANTIGEN: Hepatitis B Surface Ag: NEGATIVE

## 2022-06-06 LAB — RPR: RPR Ser Ql: NONREACTIVE

## 2022-06-06 LAB — HEPATITIS C ANTIBODY: Hep C Virus Ab: NONREACTIVE

## 2022-06-10 LAB — CYTOLOGY - PAP
Comment: NEGATIVE
Diagnosis: UNDETERMINED — AB
High risk HPV: NEGATIVE

## 2022-06-11 ENCOUNTER — Telehealth: Payer: Self-pay

## 2022-06-11 NOTE — Telephone Encounter (Signed)
Pt returned call, advised of pap results and recommendations.

## 2022-06-11 NOTE — Telephone Encounter (Signed)
Attempted to contact about pap results, no answer, left vm

## 2022-06-27 IMAGING — DX DG CHEST 2V
2 series · 2 of 2 positions shown · non-contrast
Comparison: 04/10/2021

CLINICAL DATA: Chest pain on the right, initial encounter

EXAM:
CHEST - 2 VIEW

[chest pa]
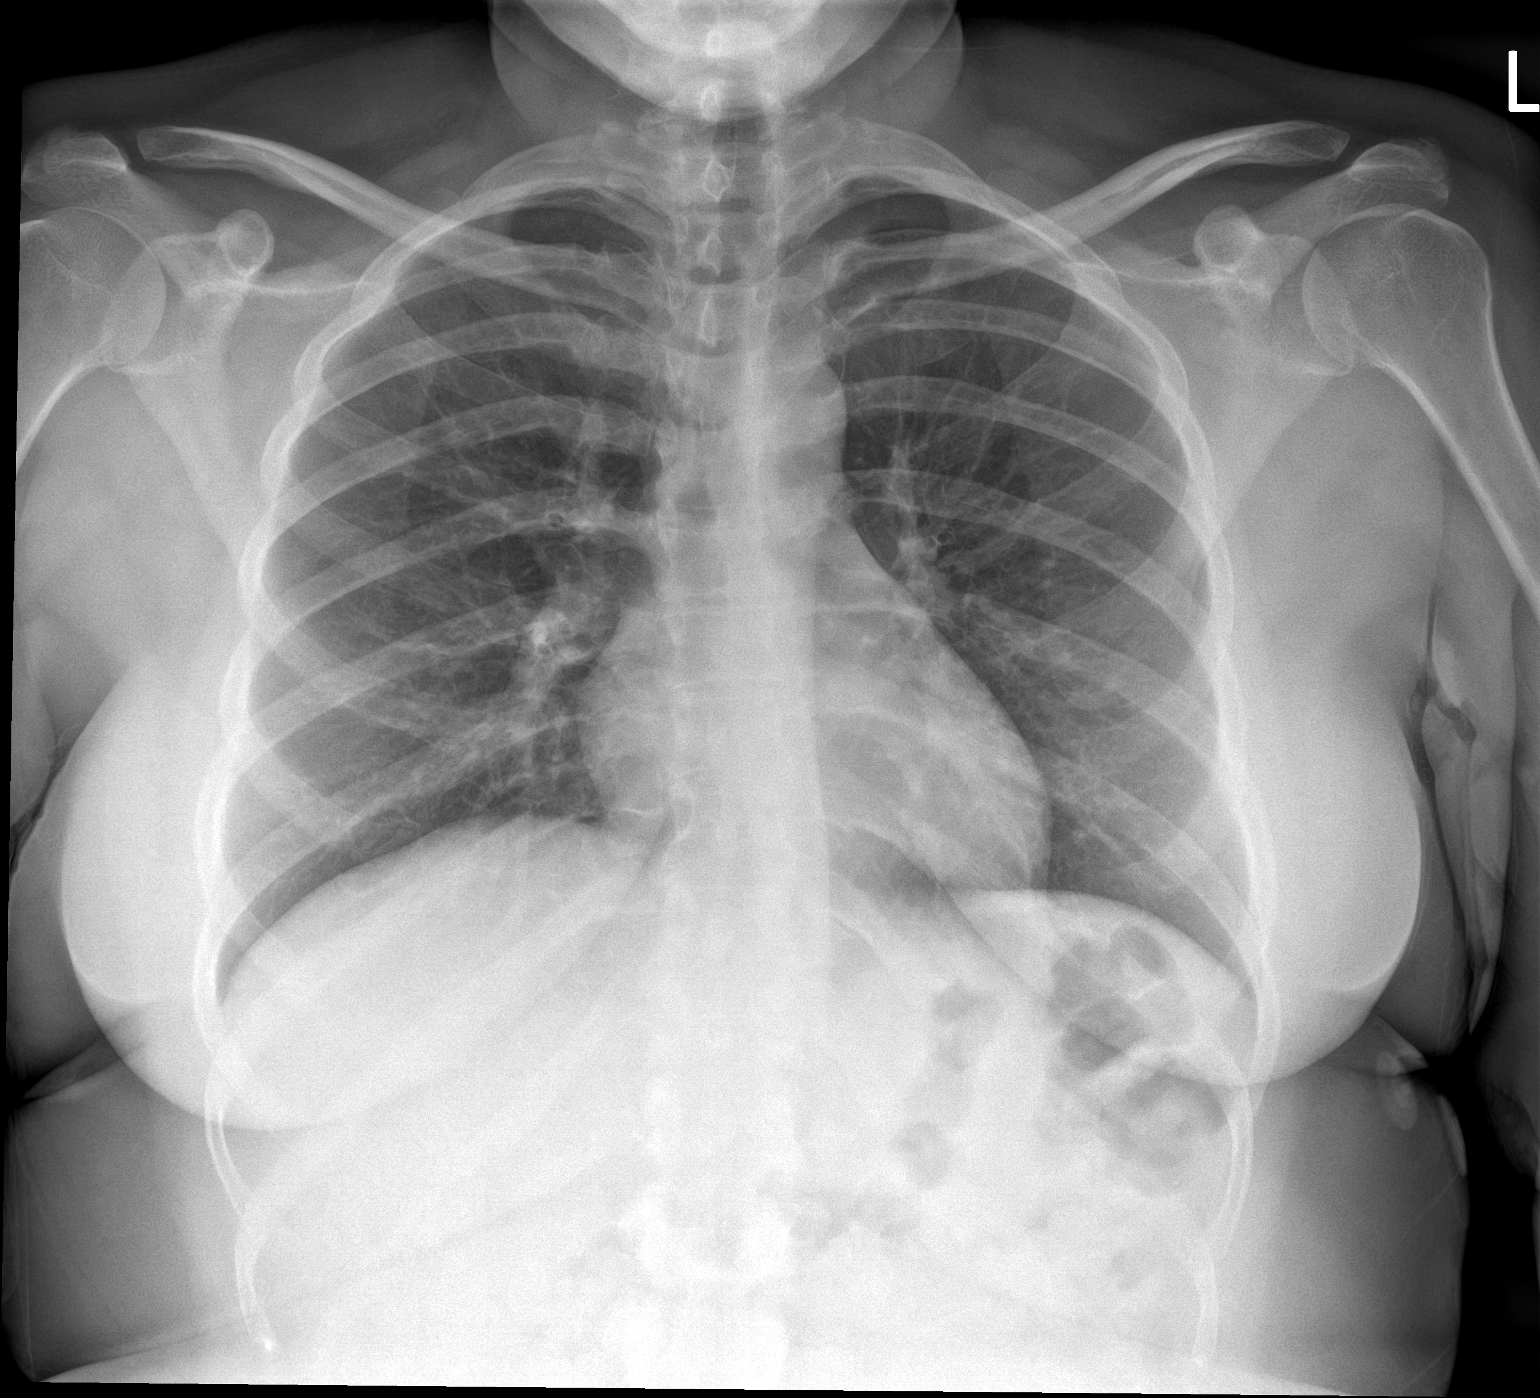

[chest lat]
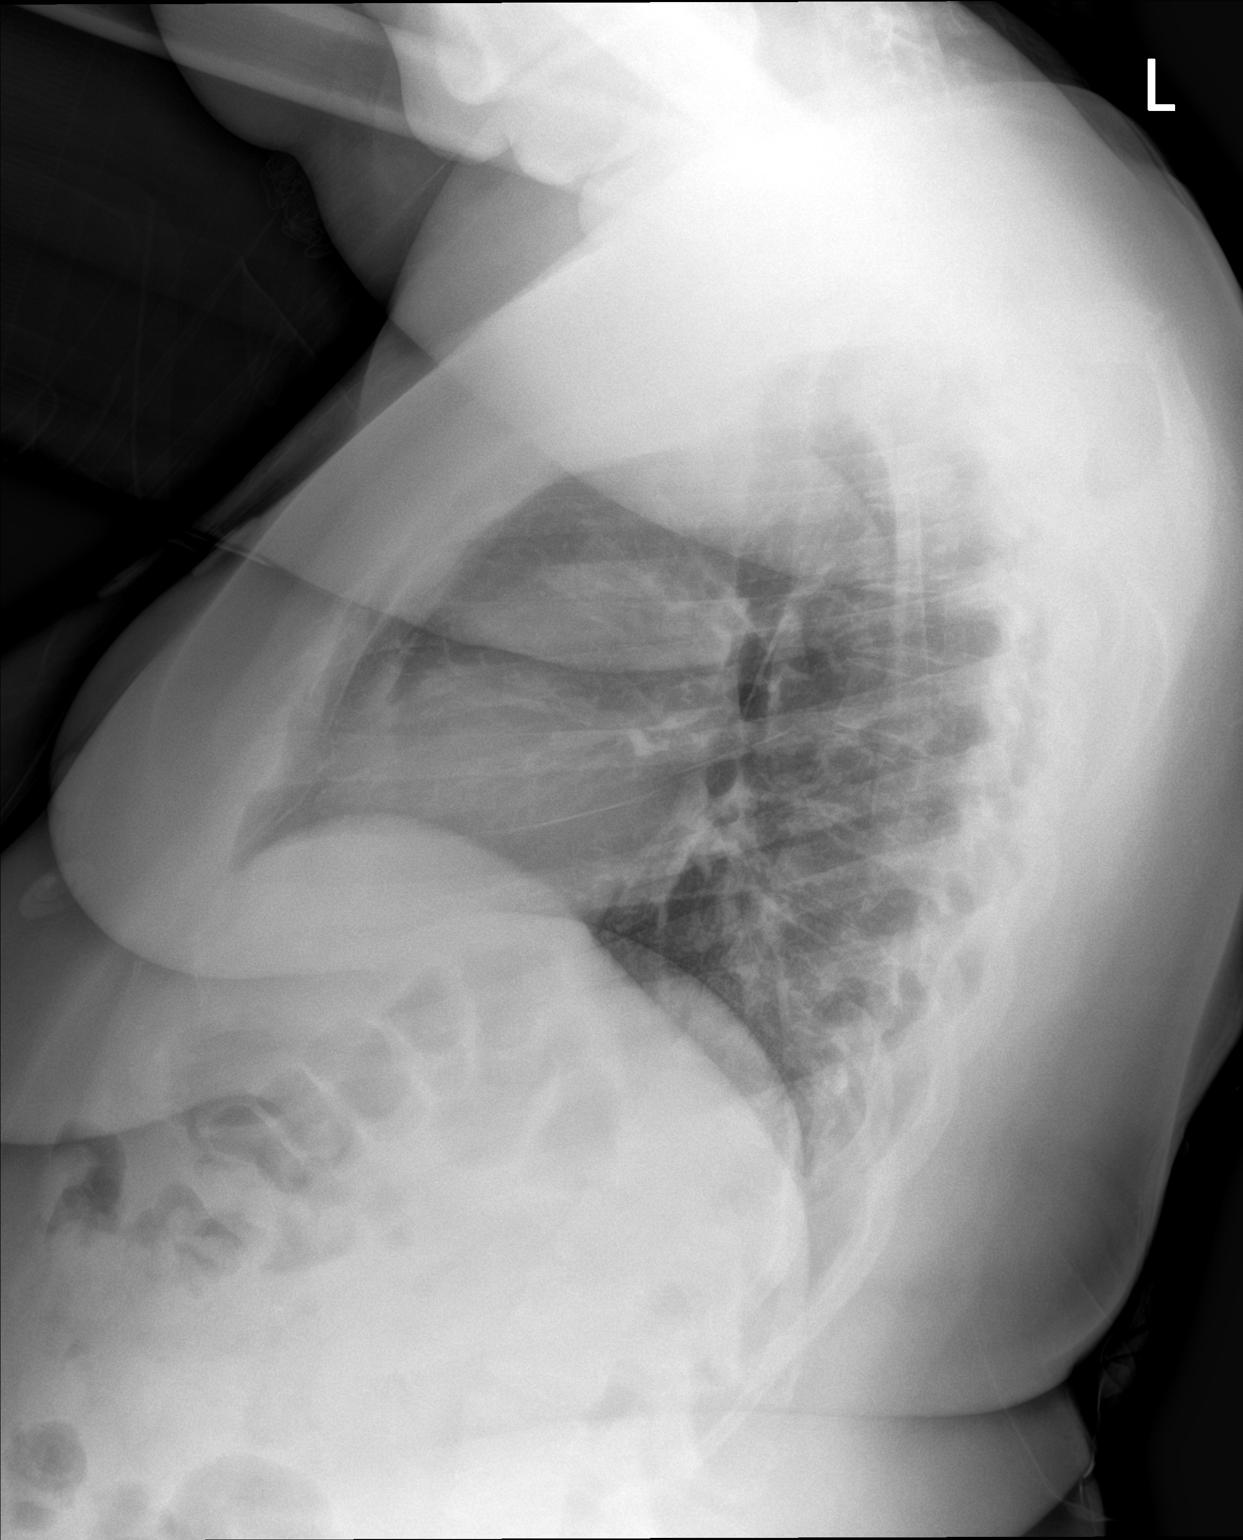

[2 of 2 positions shown; findings below may reference images not displayed]

FINDINGS: The heart size and mediastinal contours are within normal limits.
Both lungs are clear. The visualized skeletal structures are
unremarkable.
IMPRESSION: No active cardiopulmonary disease.

## 2022-06-28 IMAGING — CT CT ANGIO CHEST
2 of 7 series · 17 of 46 positions shown · IV contrast (agent unspecified)
Comparison: Chest x-ray from the previous day.

CLINICAL DATA: Chest pain and shortness of breath

EXAM:
CT ANGIOGRAPHY CHEST WITH CONTRAST
TECHNIQUE: Multidetector CT imaging of the chest was performed using the
standard protocol during bolus administration of intravenous
contrast. Multiplanar CT image reconstructions and MIPs were
obtained to evaluate the vascular anatomy.

[Series 7: pe axial thins · axial · 0.76mm/px · z∈[+1067,+1277]mm · 14 of 244 slices shown]
[im 17/244  lung]
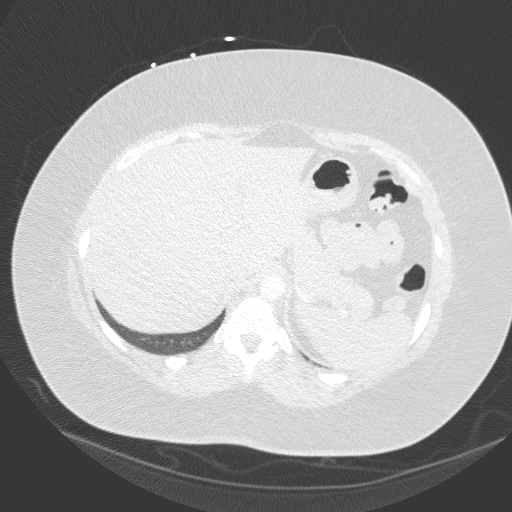
[im 33/244  soft-tissue]
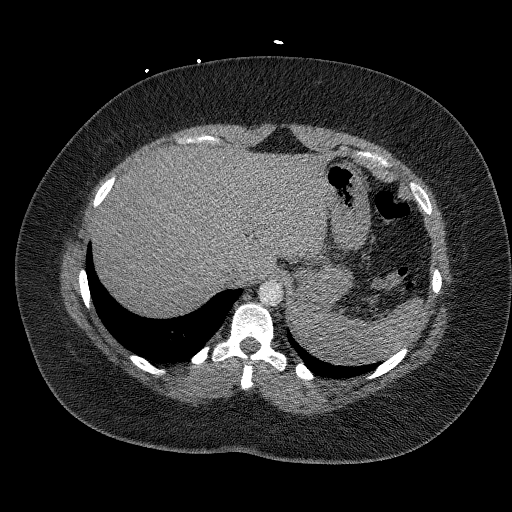
[im 49/244  lung]
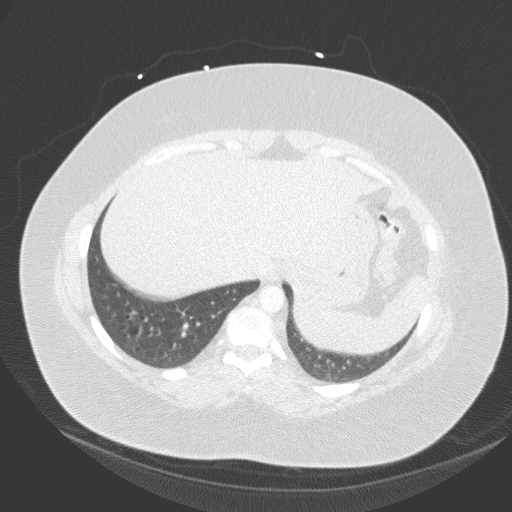
[im 65/244  soft-tissue]
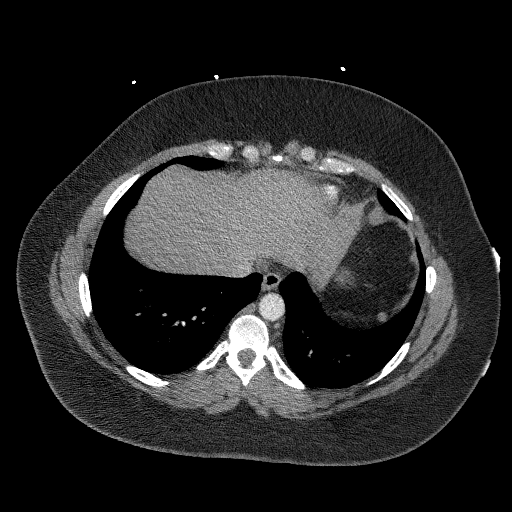
[im 82/244  lung]
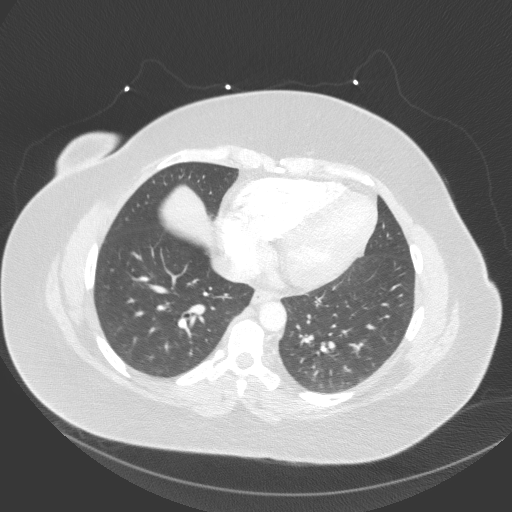
[im 98/244  soft-tissue]
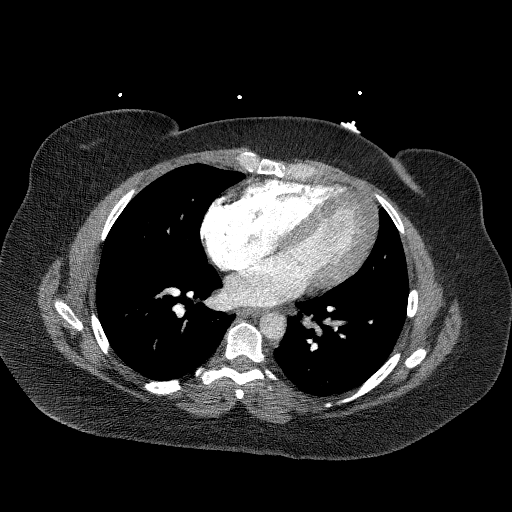
[im 114/244  lung]
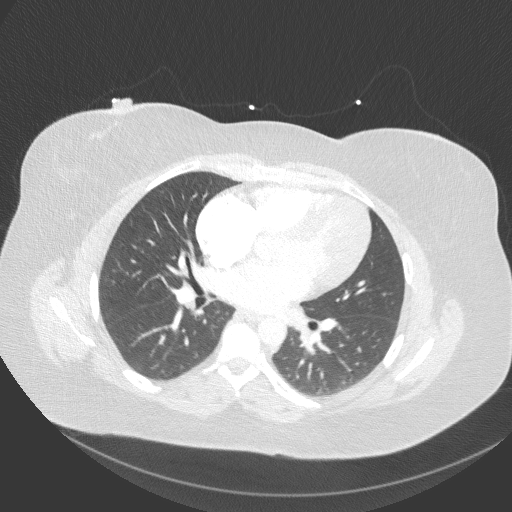
[im 130/244  soft-tissue]
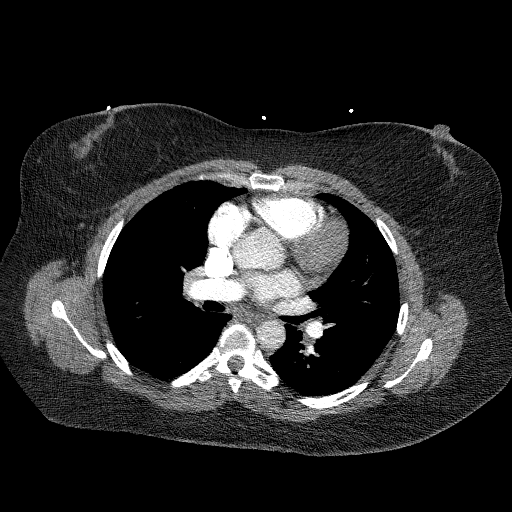
[im 146/244  lung]
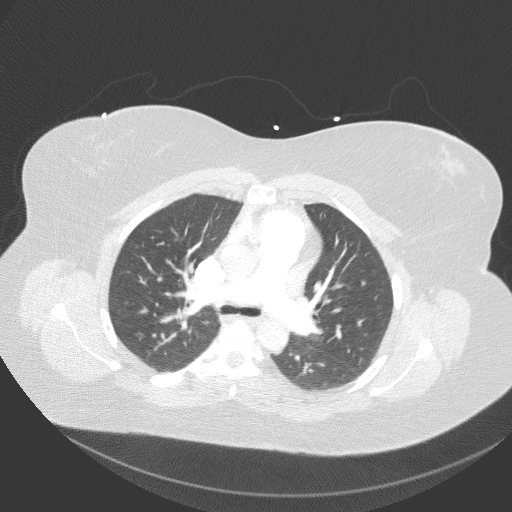
[im 163/244  soft-tissue]
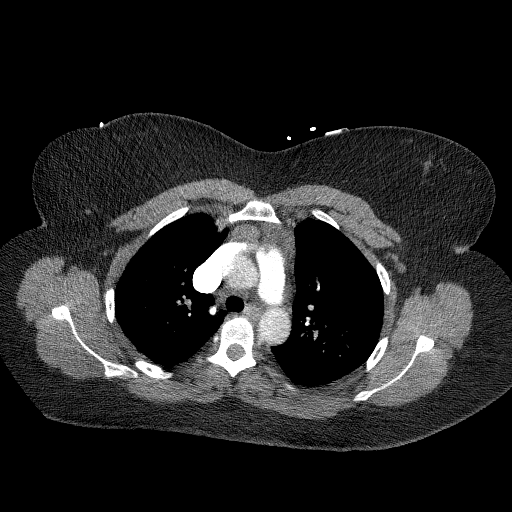
[im 179/244  lung]
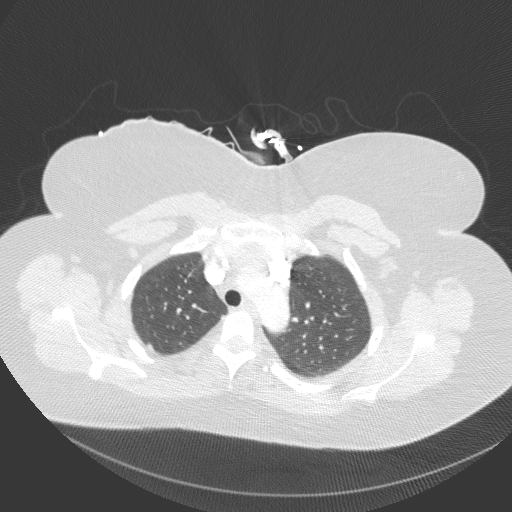
[im 195/244  soft-tissue]
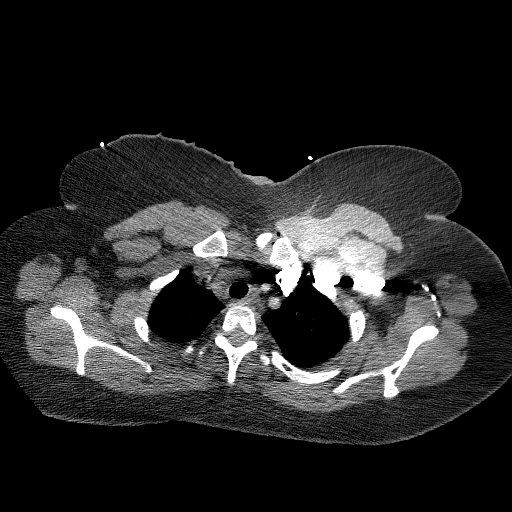
[im 211/244  lung]
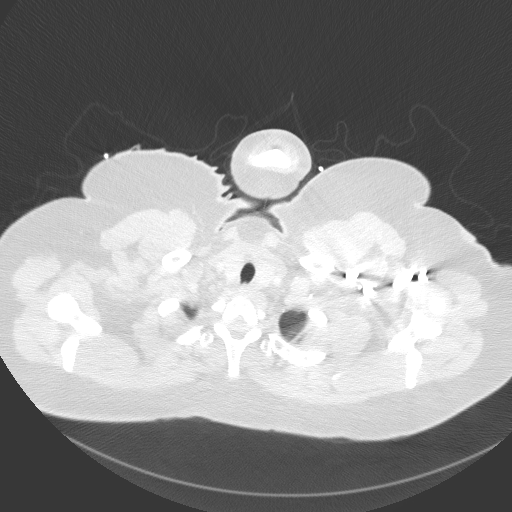
[im 227/244  soft-tissue]
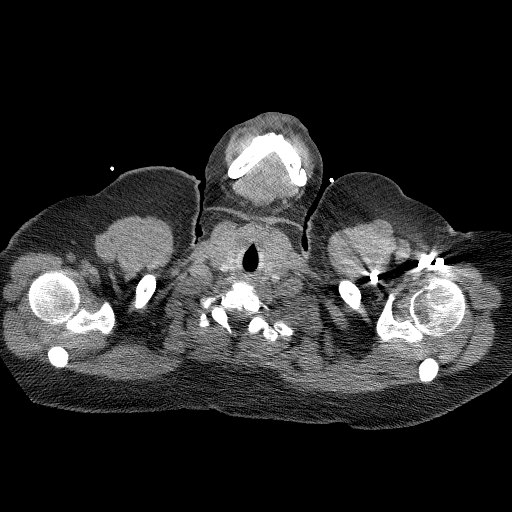

[Series 9: cor soft · coronal · 0.48mm/px · 3 of 144 slices shown]
[im 36/144  soft-tissue]
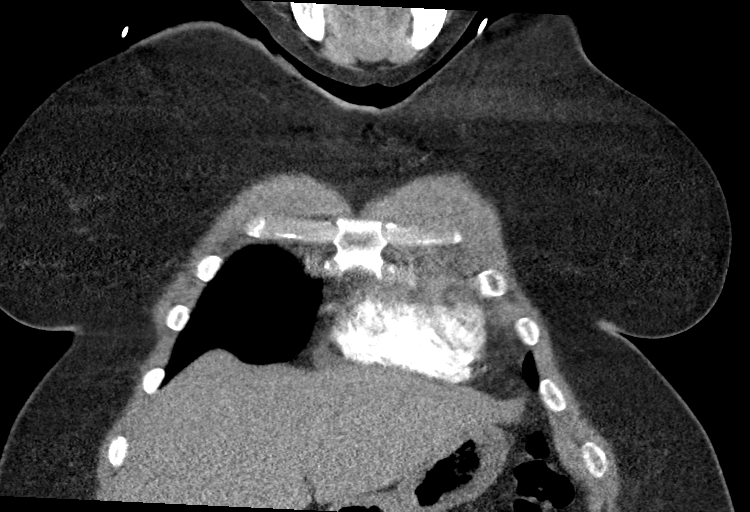
[im 72/144  soft-tissue]
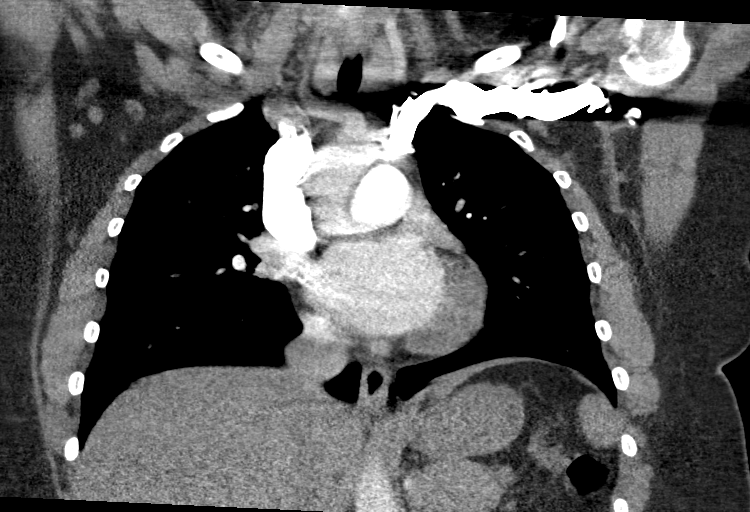
[im 108/144  soft-tissue]
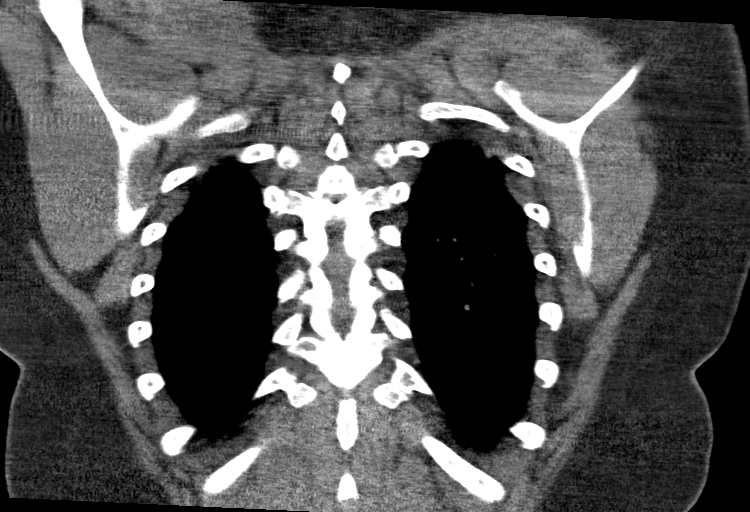

[17 of 46 positions shown; findings below may reference images not displayed]

RADIATION DOSE REDUCTION: This exam was performed according to the
departmental dose-optimization program which includes automated
exposure control, adjustment of the mA and/or kV according to
patient size and/or use of iterative reconstruction technique.

CONTRAST:  66mL OMNIPAQUE IOHEXOL 350 MG/ML SOLN
FINDINGS: Cardiovascular: Thoracic aorta shows no aneurysmal dilatation or
dissection. Heart is at the upper limits of normal in size.
Pulmonary artery shows a normal branching pattern bilaterally. No
filling defect to suggest pulmonary embolism is seen.

Mediastinum/Nodes: Thoracic inlet is within normal limits. No
sizable hilar or mediastinal adenopathy is noted. The esophagus as
visualized is within normal limits.

Lungs/Pleura: Lungs are well aerated bilaterally. No focal
infiltrate or sizable effusion is seen. No parenchymal nodules are
noted.

Upper Abdomen: Visualized upper abdomen shows no acute abnormality.

Musculoskeletal: No chest wall abnormality. No acute or significant
osseous findings.

Review of the MIP images confirms the above findings.
IMPRESSION: No evidence of pulmonary emboli.

No acute abnormality noted.

## 2022-07-15 ENCOUNTER — Other Ambulatory Visit: Payer: Self-pay

## 2022-07-15 ENCOUNTER — Telehealth: Payer: Self-pay

## 2022-07-15 DIAGNOSIS — B379 Candidiasis, unspecified: Secondary | ICD-10-CM

## 2022-07-15 MED ORDER — FLUCONAZOLE 150 MG PO TABS: 150.0000 mg | ORAL_TABLET | Freq: Once | ORAL | 0 refills | Status: AC

## 2022-07-15 NOTE — Telephone Encounter (Signed)
Patient called and left message requesting rx for yeast due to having thick white vaginal discharge and vaginal itching.  Attempted to call patient back. No answer.   Diflucan sent to pharmacy per protocol

## 2022-07-18 ENCOUNTER — Ambulatory Visit (INDEPENDENT_AMBULATORY_CARE_PROVIDER_SITE_OTHER): Payer: BC Managed Care – PPO | Admitting: Obstetrics

## 2022-07-18 ENCOUNTER — Other Ambulatory Visit (HOSPITAL_COMMUNITY)
Admission: RE | Admit: 2022-07-18 | Discharge: 2022-07-18 | Disposition: A | Discharge status: Home / Self Care | Payer: BC Managed Care – PPO | Source: Ambulatory Visit | Attending: Obstetrics | Admitting: Obstetrics

## 2022-07-18 ENCOUNTER — Encounter: Payer: Self-pay | Admitting: Obstetrics

## 2022-07-18 VITALS — BP 130/82 | HR 89 | Ht 64.0 in | Wt 260.0 lb

## 2022-07-18 DIAGNOSIS — Z1239 Encounter for other screening for malignant neoplasm of breast: Secondary | ICD-10-CM | POA: Diagnosis not present

## 2022-07-18 DIAGNOSIS — N898 Other specified noninflammatory disorders of vagina: Secondary | ICD-10-CM | POA: Diagnosis not present

## 2022-07-18 DIAGNOSIS — Z6841 Body Mass Index (BMI) 40.0 and over, adult: Secondary | ICD-10-CM | POA: Diagnosis not present

## 2022-07-18 DIAGNOSIS — Z113 Encounter for screening for infections with a predominantly sexual mode of transmission: Secondary | ICD-10-CM

## 2022-07-18 MED ORDER — METRONIDAZOLE 500 MG PO TABS: 500.0000 mg | ORAL_TABLET | Freq: Two times a day (BID) | ORAL | 2 refills | Status: DC

## 2022-07-18 NOTE — Progress Notes (Unsigned)
Subjective:        Felicia Pham is a 42 y.o. female here for a routine exam.  Current complaints: Vaginal discharge with irritation.    Personal health questionnaire:  Is patient [name], have a family history of breast and/or ovarian cancer: no Is there a family history of uterine cancer diagnosed at age < 10750, gastrointestinal cancer, urinary tract cancer, family member who is a Personnel officerLynch syndrome-associated carrier: no Is the patient overweight and hypertensive, family history of diabetes, personal history of gestational diabetes, preeclampsia or PCOS: no Is patient over 1455, have PCOS,  family history of premature CHD under age 265, diabetes, smoke, have hypertension or peripheral artery disease:  no At any time, has a partner hit, kicked or otherwise hurt or frightened you?: no Over the past 2 weeks, have you felt down, depressed or hopeless?: no Over the past 2 weeks, have you felt little interest or pleasure in doing things?:no   Gynecologic History Patient's last menstrual period was 06/22/2022. Contraception: condoms Last Pap: 2024. Results were: normal Last mammogram: n/a. Results were: n/a  Obstetric History OB History  Gravida Para Term Preterm AB Living  0 0 0 0 0 0  SAB IAB Ectopic Multiple Live Births  0 0 0 0      Past Medical History:  Diagnosis Date   DVT (deep venous thrombosis)    PE (pulmonary thromboembolism)    Rib fracture    Uterine fibroid     Past Surgical History:  Procedure Laterality Date   MYOMECTOMY  2014   MYOMECTOMY ABDOMINAL APPROACH  10/2021     Current Outpatient Medications:    metroNIDAZOLE (FLAGYL) 500 MG tablet, Take 1 tablet (500 mg total) by mouth 2 (two) times daily., Disp: 14 tablet, Rfl: 2   acetaminophen (TYLENOL) 325 MG tablet, Take 2 tablets (650 mg total) by mouth every 6 (six) hours as needed for headache or mild pain., Disp: 60 tablet, Rfl: 0   amoxicillin-clavulanate (AUGMENTIN) 875-125 MG tablet, Take 1  tablet by mouth every 12 (twelve) hours. (Patient not taking: Reported on 06/05/2022), Disp: 14 tablet, Rfl: 0   Cholecalciferol (VITAMIN D3) 50 MCG (2000 UT) CAPS, Take 4,000 Units by mouth daily., Disp: , Rfl:    ibuprofen (ADVIL) 600 MG tablet, Take 1 tablet (600 mg total) by mouth every 6 (six) hours as needed., Disp: 30 tablet, Rfl: 0   ipratropium (ATROVENT) 0.06 % nasal spray, Place 2 sprays into both nostrils 4 (four) times daily., Disp: 15 mL, Rfl: 0   ketoconazole (NIZORAL) 2 % cream, Apply 1 application  topically 2 (two) times daily., Disp: , Rfl:    ketoconazole (NIZORAL) 2 % shampoo, Apply 1 application  topically every 14 (fourteen) days., Disp: , Rfl:    Multiple Vitamins-Minerals (MULTIVITAMIN WITH MINERALS) tablet, Take 1 tablet by mouth daily., Disp: , Rfl:    promethazine-dextromethorphan (PROMETHAZINE-DM) 6.25-15 MG/5ML syrup, Take 5 mLs by mouth 4 (four) times daily as needed for cough., Disp: 118 mL, Rfl: 0   spironolactone (ALDACTONE) 25 MG tablet, Take 25 mg by mouth daily as needed., Disp: , Rfl:  Allergies  Allergen Reactions   Taytulla [Norethin Ace-Eth Estrad-Fe]     Pt unable to take any OCP due to pulmonary embolus.    Social History   Tobacco Use   Smoking status: Never   Smokeless tobacco: Never  Substance Use Topics   Alcohol use: No    Family History  Problem Relation Age of Onset  Lupus Mother    Hypertension Mother    Hypertension Father    Diabetes Father    Heart attack Father 33   Diabetes Maternal Grandmother    Diabetes Paternal Grandmother       Review of Systems  Constitutional: negative for fatigue and weight loss Respiratory: negative for cough and wheezing Cardiovascular: negative for chest pain, fatigue and palpitations Gastrointestinal: negative for abdominal pain and change in bowel habits Musculoskeletal:negative for myalgias Neurological: negative for gait problems and tremors Behavioral/Psych: negative for abusive  relationship, depression Endocrine: negative for temperature intolerance    Genitourinary: positive for vaginal discharge.  negative for abnormal menstrual periods, genital lesions, hot flashes, sexual problems  Integument/breast: negative for breast lump, breast tenderness, nipple discharge and skin lesion(s)    Objective:       BP 130/82   Pulse 89   Ht 5\' 4"  (1.626 m)   Wt 260 lb (117.9 kg)   LMP 06/22/2022   BMI 44.63 kg/m  General:   Alert and no distress  Skin:   no rash or abnormalities  Lungs:   clear to auscultation bilaterally  Heart:   regular rate and rhythm, S1, S2 normal, no murmur, click, rub or gallop  Breasts:   normal without suspicious masses, skin or nipple changes or axillary nodes  Abdomen:  normal findings: no organomegaly, soft, non-tender and no hernia  Pelvis:  External genitalia: normal general appearance Urinary system: urethral meatus normal and bladder without fullness, nontender Vaginal: normal without tenderness, induration or masses Cervix: normal appearance Adnexa: normal bimanual exam Uterus: anteverted and non-tender, normal size   Lab Review Urine pregnancy test Labs reviewed yes Radiologic studies reviewed no  I have spent a total of 20 minutes of face-to-face ime, excluding clinical staff time, reviewing notes and preparing to see patient, ordering tests and/or medications, and counseling the patient.   Assessment:    1. Vaginal discharge Rx: - Cervicovaginal ancillary only( [place]) - metroNIDAZOLE (FLAGYL) 500 MG tablet; Take 1 tablet (500 mg total) by mouth 2 (two) times daily.  Dispense: 14 tablet; Refill: 2  2. Screen for STD (sexually transmitted disease) Rx: - HIV Antibody (routine testing w rflx) - Hepatitis B surface antigen - RPR - Hepatitis C antibody  3. Class 3 severe obesity due to excess calories without serious comorbidity with body mass index (BMI) of 45.0 to 49.9 in adult - weight reduction  recommended  4. Screening breast examination Rx: - MM Digital Screening; Future     Plan:    Education reviewed: calcium supplements, depression evaluation, low fat, low cholesterol diet, safe sex/STD prevention, self breast exams, and weight bearing exercise. Contraception: condoms. Mammogram ordered. Follow up in: 1 year.   Meds ordered this encounter  Medications   metroNIDAZOLE (FLAGYL) 500 MG tablet    Sig: Take 1 tablet (500 mg total) by mouth 2 (two) times daily.    Dispense:  14 tablet    Refill:  2   Orders Placed This Encounter  Procedures   MM Digital Screening    Standing Status:   Future    Standing Expiration Date:   07/19/2023    Order Specific Question:   Reason for Exam (SYMPTOM  OR DIAGNOSIS REQUIRED)    Answer:   Screening    Order Specific Question:   Is the patient pregnant?    Answer:   No    Order Specific Question:   Preferred imaging location?    Answer:   [facility]   HIV Antibody (routine testing w rflx)   Hepatitis B surface antigen   RPR   Hepatitis C antibody     Brock BadHarper, Lucerito Rosinski A, MD 07/19/2022 9:52 AM

## 2022-07-18 NOTE — Progress Notes (Signed)
42 y.o. GYN presents for yellowish, greenish discharge, itching, odor, pain x 5 days.  Last PAP 06/05/2022

## 2022-07-19 LAB — HIV ANTIBODY (ROUTINE TESTING W REFLEX): HIV Screen 4th Generation wRfx: NONREACTIVE

## 2022-07-19 LAB — RPR: RPR Ser Ql: NONREACTIVE

## 2022-07-19 LAB — HEPATITIS C ANTIBODY: Hep C Virus Ab: NONREACTIVE

## 2022-07-19 LAB — HEPATITIS B SURFACE ANTIGEN: Hepatitis B Surface Ag: NEGATIVE

## 2022-07-21 ENCOUNTER — Other Ambulatory Visit: Payer: Self-pay | Admitting: Obstetrics

## 2022-07-21 DIAGNOSIS — B379 Candidiasis, unspecified: Secondary | ICD-10-CM

## 2022-07-21 LAB — CERVICOVAGINAL ANCILLARY ONLY
Bacterial Vaginitis (gardnerella): NEGATIVE
Candida Glabrata: NEGATIVE
Candida Vaginitis: POSITIVE — AB
Chlamydia: NEGATIVE
Comment: NEGATIVE
Comment: NEGATIVE
Comment: NEGATIVE
Comment: NEGATIVE
Comment: NEGATIVE
Comment: NORMAL
Neisseria Gonorrhea: NEGATIVE
Trichomonas: NEGATIVE

## 2022-07-21 MED ORDER — FLUCONAZOLE 150 MG PO TABS: 150.0000 mg | ORAL_TABLET | Freq: Once | ORAL | 0 refills | Status: AC

## 2022-07-25 NOTE — Progress Notes (Signed)
Pt has seen results in [facility]. MC message with DX, RX info, and education sent.

## 2022-08-12 ENCOUNTER — Encounter (HOSPITAL_BASED_OUTPATIENT_CLINIC_OR_DEPARTMENT_OTHER): Payer: Self-pay | Admitting: Emergency Medicine

## 2022-08-12 ENCOUNTER — Other Ambulatory Visit: Payer: Self-pay

## 2022-08-12 ENCOUNTER — Emergency Department (HOSPITAL_BASED_OUTPATIENT_CLINIC_OR_DEPARTMENT_OTHER): Payer: BC Managed Care – PPO

## 2022-08-12 ENCOUNTER — Emergency Department (HOSPITAL_BASED_OUTPATIENT_CLINIC_OR_DEPARTMENT_OTHER)
Admission: EM | Admit: 2022-08-12 | Discharge: 2022-08-12 | Disposition: A | Discharge status: Home / Self Care | Payer: BC Managed Care – PPO | Attending: Emergency Medicine | Admitting: Emergency Medicine

## 2022-08-12 DIAGNOSIS — M79606 Pain in leg, unspecified: Secondary | ICD-10-CM

## 2022-08-12 DIAGNOSIS — M79662 Pain in left lower leg: Secondary | ICD-10-CM | POA: Diagnosis not present

## 2022-08-12 DIAGNOSIS — R079 Chest pain, unspecified: Secondary | ICD-10-CM | POA: Insufficient documentation

## 2022-08-12 DIAGNOSIS — M79605 Pain in left leg: Secondary | ICD-10-CM | POA: Diagnosis not present

## 2022-08-12 LAB — D-DIMER, QUANTITATIVE: D-Dimer, Quant: 1.89 ug/mL-FEU — ABNORMAL HIGH (ref 0.00–0.50)

## 2022-08-12 LAB — CBC
HCT: 34.5 % — ABNORMAL LOW (ref 36.0–46.0)
Hemoglobin: 11.3 g/dL — ABNORMAL LOW (ref 12.0–15.0)
MCH: 27 pg (ref 26.0–34.0)
MCHC: 32.8 g/dL (ref 30.0–36.0)
MCV: 82.5 fL (ref 80.0–100.0)
Platelets: 331 10*3/uL (ref 150–400)
RBC: 4.18 MIL/uL (ref 3.87–5.11)
RDW: 15.1 % (ref 11.5–15.5)
WBC: 9 10*3/uL (ref 4.0–10.5)
nRBC: 0 % (ref 0.0–0.2)

## 2022-08-12 LAB — BASIC METABOLIC PANEL
Anion gap: 10 (ref 5–15)
BUN: 13 mg/dL (ref 6–20)
CO2: 21 mmol/L — ABNORMAL LOW (ref 22–32)
Calcium: 9.4 mg/dL (ref 8.9–10.3)
Chloride: 105 mmol/L (ref 98–111)
Creatinine, Ser: 0.79 mg/dL (ref 0.44–1.00)
GFR, Estimated: >60 mL/min (ref >60)
Glucose, Bld: 87 mg/dL (ref 70–99)
Potassium: 3.7 mmol/L (ref 3.5–5.1)
Sodium: 136 mmol/L (ref 135–145)

## 2022-08-12 LAB — HCG, SERUM, QUALITATIVE: Preg, Serum: NEGATIVE

## 2022-08-12 LAB — TROPONIN I (HIGH SENSITIVITY): Troponin I (High Sensitivity): 3 ng/L (ref <18)

## 2022-08-12 MED ORDER — KETOROLAC TROMETHAMINE 15 MG/ML IJ SOLN
15.0000 mg | Freq: Once | INTRAMUSCULAR | Status: AC
  Administered 2022-08-12: 15 mg via INTRAVENOUS
  Filled 2022-08-12: qty 1

## 2022-08-12 MED ORDER — LIDOCAINE 5 % EX PTCH
1.0000 | MEDICATED_PATCH | CUTANEOUS | Status: DC
  Administered 2022-08-12: 1 via TRANSDERMAL
  Filled 2022-08-12: qty 1

## 2022-08-12 MED ORDER — IOHEXOL 350 MG/ML SOLN
100.0000 mL | Freq: Once | INTRAVENOUS | Status: AC | PRN
  Administered 2022-08-12: 100 mL via INTRAVENOUS

## 2022-08-12 MED ORDER — LORAZEPAM 1 MG PO TABS
1.0000 mg | ORAL_TABLET | Freq: Once | ORAL | Status: AC
  Administered 2022-08-12: 1 mg via ORAL
  Filled 2022-08-12: qty 1

## 2022-08-12 MED ORDER — ACETAMINOPHEN 500 MG PO TABS
1000.0000 mg | ORAL_TABLET | Freq: Once | ORAL | Status: AC
  Administered 2022-08-12: 1000 mg via ORAL
  Filled 2022-08-12: qty 2

## 2022-08-12 NOTE — ED Provider Notes (Addendum)
[facility]GENC[facility] Emergency Department Provider Note MRN:  [id number]  Arrival date & time: 08/12/22     Chief Complaint   Chest Pain   History of Present Illness   Felicia Pham is a 42 y.o. year-old female with a history of VTE presenting to the ED with chief complaint of chest pain.  Leg pain and chest pain.  Left leg pain for 2 days with intermittent central chest sharp pain.  Recently had a 22-hour flight back from [place]  History of blood clots in the past, feels similar, not on blood thinners.  Review of Systems  A thorough review of systems was obtained and all systems are negative except as noted in the HPI and PMH.   Patient's Health History    Past Medical History:  Diagnosis Date   DVT (deep venous thrombosis) (HCC)    PE (pulmonary thromboembolism) (HCC)    Rib fracture    Uterine fibroid     Past Surgical History:  Procedure Laterality Date   MYOMECTOMY  2014   MYOMECTOMY ABDOMINAL APPROACH  10/2021    Family History  Problem Relation Age of Onset   Lupus Mother    Hypertension Mother    Hypertension Father    Diabetes Father    Heart attack Father 41   Diabetes Maternal Grandmother    Diabetes Paternal Grandmother     Social History   Socioeconomic History   Marital status: Single    Spouse name: Not on file   Number of children: Not on file   Years of education: Not on file   Highest education level: Not on file  Occupational History   Not on file  Tobacco Use   Smoking status: Never   Smokeless tobacco: Never  Vaping Use   Vaping Use: Never used  Substance and Sexual Activity   Alcohol use: No   Drug use: No   Sexual activity: Yes    Partners: Male    Birth control/protection: None, Condom    Comment: pt has had Lupron- 02/2021  Other Topics Concern   Not on file  Social History Narrative   Not on file   Social Determinants of Health   Financial Resource Strain: Not on file  Food Insecurity: Not on file   Transportation Needs: Not on file  Physical Activity: Not on file  Stress: Not on file  Social Connections: Not on file  Intimate Partner Violence: Not on file     Physical Exam   Vitals:   08/12/22 0408 08/12/22 0445  BP:  112/82  Pulse:  76  Resp:  19  Temp: 98.4 F (36.9 C)   SpO2:  100%    CONSTITUTIONAL: Well-appearing, NAD NEURO/PSYCH:  Alert and oriented x 3, no focal deficits EYES:  eyes equal and reactive ENT/NECK:  no LAD, no JVD CARDIO: Regular rate, well-perfused, normal S1 and S2 PULM:  CTAB no wheezing or rhonchi GI/GU:  non-distended, non-tender MSK/SPINE:  No gross deformities, no edema SKIN:  no rash, atraumatic   *Additional and/or pertinent findings included in MDM below  Diagnostic and Interventional Summary    EKG Interpretation  Date/Time:  Tuesday August 12 2022 04:04:49 EDT Ventricular Rate:  89 PR Interval:  127 QRS Duration: 94 QT Interval:  343 QTC Calculation: 418 R Axis:   28 Text Interpretation: Sinus rhythm Artifact in lead(s) aVR aVF V1 V2 V3 V4 V5 V6 Confirmed by Kennis Carina [id number]) on 08/12/2022 4:26:26 AM  Labs Reviewed  CBC - Abnormal; Notable for the following components:      Result Value   Hemoglobin 11.3 (*)    HCT 34.5 (*)    All other components within normal limits  BASIC METABOLIC PANEL - Abnormal; Notable for the following components:   CO2 21 (*)    All other components within normal limits  D-DIMER, QUANTITATIVE - Abnormal; Notable for the following components:   D-Dimer, Quant 1.89 (*)    All other components within normal limits  HCG, SERUM, QUALITATIVE  TROPONIN I (HIGH SENSITIVITY)    US Venous Img Lower Bilateral  Final Result    CT Angio Chest Pulmonary Embolism (PE) W or WO Contrast  Final Result    DG Chest Port 1 View  Final Result      Medications  ketorolac (TORADOL) 15 MG/ML injection 15 mg (15 mg Intravenous Given 08/12/22 0416)  iohexol (OMNIPAQUE) 350 MG/ML injection 100 mL  (100 mLs Intravenous Contrast Given 08/12/22 0453)     Procedures  /  Critical Care Procedures  ED Course and Medical Decision Making  Initial Impression and Ddx Given patient's history and the travel in the symptoms there is concern for possible PE.  However patient's vital signs are very normal and she is in no acute distress.  Legs do not have any evidence of swelling.  Past medical/surgical history that increases complexity of ED encounter: History of VTE  Interpretation of Diagnostics I personally reviewed the EKG and my interpretation is as follows: Sinus rhythm  Labs reassuring with no significant blood count or electrolyte disturbance, troponin negative, D-dimer is positive.  Patient Reassessment and Ultimate Disposition/Management     CTPA negative for PE, ultrasounds without evidence of DVT.  Appropriate for discharge. Patient management required discussion with the following services or consulting groups:  None  Complexity of Problems Addressed Acute illness or injury that poses threat of life of bodily function  Additional Data Reviewed and Analyzed Further history obtained from: None  Additional Factors Impacting ED Encounter Risk None  Elmer Sow. Pilar Plate, MD [facility] Emergency Medicine [facility] [email or web address]  Final Clinical Impressions(s) / ED Diagnoses     ICD-10-CM   1. Chest pain, unspecified type  R07.9     2. Pain of lower extremity, unspecified laterality  M79.606       ED Discharge Orders     None        Discharge Instructions Discussed with and Provided to Patient:     Discharge Instructions      You were evaluated in the Emergency Department and after careful evaluation, we did not find any emergent condition requiring admission or further testing in the hospital.  Your exam/testing today was overall reassuring.  No signs of blood clots in your legs or your lungs.  Suspect muscular type of pain.  Use Tylenol  or Motrin as needed for discomfort.  Please return to the Emergency Department if you experience any worsening of your condition.  Thank you for allowing [place] to be a part of your care.        Sabas Sous, MD 08/12/22 6578    Sabas Sous, MD 08/12/22 [phone]

## 2022-08-12 NOTE — ED Provider Notes (Signed)
  Physical Exam  BP 119/73 (BP Location: Right Arm)   Pulse 78   Temp 98.4 F (36.9 C) (Oral)   Resp (!) 21   Ht 5\' 4"  (1.626 m)   Wt 122.5 kg   LMP 07/21/2022 (Exact Date)   SpO2 100%   BMI 46.35 kg/m     Procedures  Procedures  ED Course / MDM    Medical Decision Making Amount and/or Complexity of Data Reviewed Labs: ordered. Radiology: ordered.  Risk OTC drugs. Prescription drug management.   42 year old female presenting to the emergency department with a chief complaint of chest pain for the past 2 days.  The patient has a history of DVT and PE and recently was on a 40-hour plane flight.  At my of assumption of care, the patient had been cleared for discharge.  However, the patient did have further questions regarding her workup and had questions regarding her chest discomfort.  She endorses crampy discomfort in her left leg.  She had a positive D-dimer but a bilateral lower extremity ultrasound which resulted negative for DVT.  She also underwent CTA imaging of the chest which revealed no evidence of pulmonary embolism, no other acute abnormality in the lungs or chest noted.  A CBC was without a leukocytosis and initial troponin was 3.  The patient's discomfort having ongoing for the past 2 days.  I believe a single troponin is sufficient to rule out ACS at this time.  Her EKG was nonischemic.  Her heart score is a 1.  She had no electrolyte abnormality with mildly low bicarb without anion gap acidosis, potassium was normal at 3.7.  Her pregnancy test was negative.  I reviewed the results of her testing with her at bedside and answered all questions to her satisfaction.  She does state that she has ongoing chest discomfort.  On my exam, the patient does have reproducible chest wall tenderness to palpation.  Suspect likely musculoskeletal chest pain.  She states that the 15 mg of IV Toradol did not necessarily help.  We will provide further pain control in the form of lidocaine  patch, 1 g of Tylenol and Ativan and reassess prior to discharge.  On repeat assessment, the patient was feeling symptomatically improved.  The patient does state that she has previously followed up outpatient for stress testing.  I recommended that she just discussed this option with her PCP.       Ernie Avena, MD 08/12/22 [phone]

## 2022-08-12 NOTE — ED Notes (Signed)
Patient verbalizes understanding of discharge instructions. Opportunity for questioning and answers were provided. Patient discharged from ED.  °

## 2022-08-12 NOTE — ED Triage Notes (Addendum)
Reports L leg pain since Saturday and central CP x 2 days intermittently. Recently traveling on a 22hr flight. Denies hormonal birth control  H/o PE 2 yrs ago. Not currently taking a blood thinner.  Denies SOB, fever, chills

## 2022-08-12 NOTE — Discharge Instructions (Addendum)
You were evaluated in the Emergency Department and after careful evaluation, we did not find any emergent condition requiring admission or further testing in the hospital.  Your exam/testing today was overall reassuring.  No signs of blood clots in your legs or your lungs.  Suspect muscular type of pain.  Use Tylenol or Motrin as needed for discomfort.  Additionally, recommend that you follow-up with your PCP and/your cardiology outpatient to discuss outpatient cardiac stress testing as this can stratify you for an adverse cardiac event.  Please return to the Emergency Department if you experience any worsening of your condition.  Thank you for allowing [place] to be a part of your care.

## 2023-02-25 ENCOUNTER — Emergency Department (HOSPITAL_BASED_OUTPATIENT_CLINIC_OR_DEPARTMENT_OTHER)
Admission: EM | Admit: 2023-02-25 | Discharge: 2023-02-26 | Disposition: A | Discharge status: Home / Self Care | Payer: BC Managed Care – PPO | Attending: Emergency Medicine | Admitting: Emergency Medicine

## 2023-02-25 ENCOUNTER — Emergency Department (HOSPITAL_BASED_OUTPATIENT_CLINIC_OR_DEPARTMENT_OTHER): Payer: BC Managed Care – PPO

## 2023-02-25 ENCOUNTER — Emergency Department (HOSPITAL_BASED_OUTPATIENT_CLINIC_OR_DEPARTMENT_OTHER): Payer: BC Managed Care – PPO | Admitting: Radiology

## 2023-02-25 ENCOUNTER — Other Ambulatory Visit: Payer: Self-pay

## 2023-02-25 ENCOUNTER — Encounter (HOSPITAL_BASED_OUTPATIENT_CLINIC_OR_DEPARTMENT_OTHER): Payer: Self-pay | Admitting: Emergency Medicine

## 2023-02-25 DIAGNOSIS — R0789 Other chest pain: Secondary | ICD-10-CM

## 2023-02-25 DIAGNOSIS — J069 Acute upper respiratory infection, unspecified: Secondary | ICD-10-CM | POA: Diagnosis not present

## 2023-02-25 DIAGNOSIS — B9789 Other viral agents as the cause of diseases classified elsewhere: Secondary | ICD-10-CM | POA: Diagnosis not present

## 2023-02-25 DIAGNOSIS — Z20822 Contact with and (suspected) exposure to covid-19: Secondary | ICD-10-CM | POA: Diagnosis not present

## 2023-02-25 DIAGNOSIS — R791 Abnormal coagulation profile: Secondary | ICD-10-CM | POA: Insufficient documentation

## 2023-02-25 LAB — CBC
HCT: 35.8 % — ABNORMAL LOW (ref 36.0–46.0)
Hemoglobin: 11.5 g/dL — ABNORMAL LOW (ref 12.0–15.0)
MCH: 26.1 pg (ref 26.0–34.0)
MCHC: 32.1 g/dL (ref 30.0–36.0)
MCV: 81.2 fL (ref 80.0–100.0)
Platelets: 332 10*3/uL (ref 150–400)
RBC: 4.41 MIL/uL (ref 3.87–5.11)
RDW: 15.5 % (ref 11.5–15.5)
WBC: 7.7 10*3/uL (ref 4.0–10.5)
nRBC: 0 % (ref 0.0–0.2)

## 2023-02-25 LAB — BASIC METABOLIC PANEL
Anion gap: 7 (ref 5–15)
BUN: 11 mg/dL (ref 6–20)
CO2: 25 mmol/L (ref 22–32)
Calcium: 9 mg/dL (ref 8.9–10.3)
Chloride: 103 mmol/L (ref 98–111)
Creatinine, Ser: 0.8 mg/dL (ref 0.44–1.00)
GFR, Estimated: >60 mL/min (ref >60)
Glucose, Bld: 91 mg/dL (ref 70–99)
Potassium: 4 mmol/L (ref 3.5–5.1)
Sodium: 135 mmol/L (ref 135–145)

## 2023-02-25 LAB — RESP PANEL BY RT-PCR (RSV, FLU A&B, COVID)  RVPGX2
Influenza A by PCR: NEGATIVE
Influenza B by PCR: NEGATIVE
Resp Syncytial Virus by PCR: NEGATIVE
SARS Coronavirus 2 by RT PCR: NEGATIVE

## 2023-02-25 LAB — TROPONIN I (HIGH SENSITIVITY)
Troponin I (High Sensitivity): <2 ng/L (ref <18)
Troponin I (High Sensitivity): <2 ng/L (ref <18)

## 2023-02-25 LAB — GROUP A STREP BY PCR: Group A Strep by PCR: NOT DETECTED

## 2023-02-25 LAB — PREGNANCY, URINE: Preg Test, Ur: NEGATIVE

## 2023-02-25 LAB — D-DIMER, QUANTITATIVE: D-Dimer, Quant: 1.29 ug{FEU}/mL — ABNORMAL HIGH (ref 0.00–0.50)

## 2023-02-25 MED ORDER — IOHEXOL 350 MG/ML SOLN
100.0000 mL | Freq: Once | INTRAVENOUS | Status: AC | PRN
  Administered 2023-02-26: 80 mL via INTRAVENOUS

## 2023-02-25 NOTE — ED Provider Notes (Signed)
Care assumed at shift change. Here with atypical chest pain, history of PE, no longer on AC. Awaiting CTA.  Physical Exam  BP 115/81   Pulse 72   Temp 98.2 F (36.8 C) (Temporal)   Resp 16   Wt 125 kg   SpO2 100%   BMI 47.30 kg/m   Physical Exam  Procedures  Procedures  ED Course / MDM   Clinical Course as of 02/26/23 0358  Thu Feb 26, 2023  9147 I personally viewed the images from radiology studies and agree with radiologist interpretation: CTA is negative for PE or other acute process. She is resting comfortably on re-evaluation. Plan discharge with supportive care, PCP follow up, RTED for any other concerns.   [CS]    Clinical Course User Index [CS] Pollyann Savoy, MD   Medical Decision Making Problems Addressed: Atypical chest pain: acute illness or injury Viral URI: acute illness or injury  Amount and/or Complexity of Data Reviewed Radiology: independent interpretation performed. Decision-making details documented in ED Course.  Risk Prescription drug management.          Pollyann Savoy, MD 02/26/23 [phone]

## 2023-02-25 NOTE — ED Triage Notes (Signed)
Patient endorses chest pain and sore throat x1 week.  Patient has a history of PE.

## 2023-02-25 NOTE — ED Provider Notes (Signed)
Emergency Department Provider Note   I have reviewed the triage vital signs and the nursing notes.   HISTORY  Chief Complaint Chest Pain and Sore Throat   HPI Felicia Pham is a 42 y.o. 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  Patient describes 1 week of central chest soreness with some sore throat past couple of days.  No shortness of breath.  No pleuritic pain.  No fevers or chills.  She notes a prior history of PE thought to be provoked by OCP, which was discontinued.  She completed her course of anticoagulation and is no longer taking this medicine.    Past Medical History:  Diagnosis Date   DVT (deep venous thrombosis) (HCC)    PE (pulmonary thromboembolism) (HCC)    Rib fracture    Uterine fibroid     Review of Systems  Constitutional: No fever/chills ENT: Positive sore throat. Cardiovascular: Positive chest pain. Respiratory: Denies shortness of breath. Gastrointestinal: No abdominal pain.  No nausea, no vomiting.  No diarrhea.  Musculoskeletal: Negative for back pain. Skin: Negative for rash. Neurological: Negative for headaches.  ____________________________________________   PHYSICAL EXAM:  VITAL SIGNS: ED Triage Vitals [02/25/23 2013]  Encounter Vitals Group     BP (!) 134/90     Pulse Rate 81     Resp 18     Temp 98.2 F (36.8 C)     Temp Source Temporal     SpO2 100 %     Weight 275 lb 9.2 oz (125 kg)   Constitutional: Alert and oriented. Well appearing and in no acute distress. Eyes: Conjunctivae are normal.  Head: Atraumatic. Nose: No congestion/rhinnorhea. Mouth/Throat: Mucous membranes are moist.  Oropharynx non-erythematous. Clear voice. No PTA. Managing oral secretions.  Neck: No stridor.   Cardiovascular: Normal rate, regular rhythm. Good peripheral circulation. Grossly normal heart sounds.   Respiratory: Normal respiratory effort.  No retractions. Lungs  CTAB. Gastrointestinal: Soft and nontender. No distention.  Musculoskeletal: No lower extremity tenderness nor edema. No gross deformities of extremities. Neurologic:  Normal speech and language.  Skin:  Skin is warm, dry and intact. No rash noted.  ____________________________________________   LABS (all labs ordered are listed, but only abnormal results are displayed)  Labs Reviewed  CBC - Abnormal; Notable for the following components:      Result Value   Hemoglobin 11.5 (*)    HCT 35.8 (*)    All other components within normal limits  GROUP A STREP BY PCR  RESP PANEL BY RT-PCR (RSV, FLU A&B, COVID)  [id number]  BASIC METABOLIC PANEL  PREGNANCY, URINE  D-DIMER, QUANTITATIVE  TROPONIN I (HIGH SENSITIVITY)  TROPONIN I (HIGH SENSITIVITY)   ____________________________________________  EKG   EKG Interpretation Date/Time:  Wednesday February 25 2023 20:11:11 EST Ventricular Rate:  82 PR Interval:  136 QRS Duration:  76 QT Interval:  340 QTC Calculation: 397 R Axis:   41  Text Interpretation: Normal sinus rhythm Normal ECG When compared with ECG of 12-Aug-2022 04:04, PREVIOUS ECG IS PRESENT Confirmed by Alona Bene [phone]) on 02/25/2023 8:38:07 PM        ____________________________________________  RADIOLOGY  DG Chest 2 View  Result Date: 02/25/2023 CLINICAL DATA:  Patient endorses chest pain and sore throat. History of PE. EXAM: CHEST - 2 VIEW COMPARISON:  Radiograph and CT 08/12/2022 FINDINGS: The heart size and mediastinal contours are within normal limits. Both lungs are clear. The visualized skeletal  structures are unremarkable. IMPRESSION: No active cardiopulmonary disease. Electronically Signed   By: Minerva Fester M.D.   On: 02/25/2023 20:19    ____________________________________________   PROCEDURES  Procedure(s) performed:   Procedures   ____________________________________________   INITIAL IMPRESSION / ASSESSMENT AND PLAN / ED  COURSE  Pertinent labs & imaging results that were available during my care of the patient were reviewed by me and considered in my medical decision making (see chart for details).   This patient is Presenting for Evaluation of CP, which does require a range of treatment options, and is a complaint that involves a high risk of morbidity and mortality.  The Differential Diagnoses includes but is not exclusive to acute coronary syndrome, aortic dissection, pulmonary embolism, cardiac tamponade, community-acquired pneumonia, pericarditis, musculoskeletal chest wall pain, etc.  Clinical Laboratory Tests Ordered, included troponin negative x 2.  Viral panel and strep negative.  No leukocytosis or AKI.  Radiologic Tests Ordered, included CXR. I independently interpreted the images and agree with radiology interpretation.   Cardiac Monitor Tracing which shows NSR.    Social Determinants of Health Risk patient is a non-smoker.   Consult complete with  Medical Decision Making: Summary:  Patient presents emergency department with fairly atypical chest pain.  Troponin negative x 2.  Adding on a D-dimer and her remote history of PE although thought to be provoked by OCPs at the time.   Reevaluation with update and discussion with   ***Considered admission***  Patient's presentation is most consistent with acute presentation with potential threat to life or bodily function.   Disposition:   ____________________________________________  FINAL CLINICAL IMPRESSION(S) / ED DIAGNOSES  Final diagnoses:  None     NEW OUTPATIENT MEDICATIONS STARTED DURING THIS VISIT:  New Prescriptions   No medications on file    Note:  This document was prepared using Dragon voice recognition software and may include unintentional dictation errors.  Alona Bene, MD, [facility] Emergency [facility]

## 2023-02-25 NOTE — ED Notes (Signed)
Patient to xray.

## 2023-02-26 MED ORDER — IBUPROFEN 600 MG PO TABS: 600.0000 mg | ORAL_TABLET | Freq: Four times a day (QID) | ORAL | 0 refills | Status: DC | PRN

## 2023-08-11 ENCOUNTER — Ambulatory Visit: Payer: Self-pay | Admitting: Obstetrics

## 2023-08-27 ENCOUNTER — Ambulatory Visit: Admitting: Obstetrics

## 2023-08-28 ENCOUNTER — Other Ambulatory Visit: Payer: Self-pay

## 2023-08-28 ENCOUNTER — Encounter (HOSPITAL_BASED_OUTPATIENT_CLINIC_OR_DEPARTMENT_OTHER): Payer: Self-pay | Admitting: Emergency Medicine

## 2023-08-28 ENCOUNTER — Emergency Department (HOSPITAL_BASED_OUTPATIENT_CLINIC_OR_DEPARTMENT_OTHER): Admission: EM | Admit: 2023-08-28 | Discharge: 2023-08-28 | Disposition: A | Discharge status: Home / Self Care

## 2023-08-28 ENCOUNTER — Emergency Department (HOSPITAL_BASED_OUTPATIENT_CLINIC_OR_DEPARTMENT_OTHER)

## 2023-08-28 DIAGNOSIS — R0789 Other chest pain: Secondary | ICD-10-CM | POA: Insufficient documentation

## 2023-08-28 DIAGNOSIS — S40022A Contusion of left upper arm, initial encounter: Secondary | ICD-10-CM | POA: Diagnosis not present

## 2023-08-28 DIAGNOSIS — S4992XA Unspecified injury of left shoulder and upper arm, initial encounter: Secondary | ICD-10-CM | POA: Diagnosis present

## 2023-08-28 DIAGNOSIS — W3400XA Accidental discharge from unspecified firearms or gun, initial encounter: Secondary | ICD-10-CM | POA: Insufficient documentation

## 2023-08-28 LAB — CBC WITH DIFFERENTIAL/PLATELET
Abs Immature Granulocytes: 0.02 10*3/uL (ref 0.00–0.07)
Basophils Absolute: 0 10*3/uL (ref 0.0–0.1)
Basophils Relative: 1 %
Eosinophils Absolute: 0.1 10*3/uL (ref 0.0–0.5)
Eosinophils Relative: 1 %
HCT: 32.6 % — ABNORMAL LOW (ref 36.0–46.0)
Hemoglobin: 10.4 g/dL — ABNORMAL LOW (ref 12.0–15.0)
Immature Granulocytes: 0 %
Lymphocytes Relative: 37 %
Lymphs Abs: 2.8 10*3/uL (ref 0.7–4.0)
MCH: 26.4 pg (ref 26.0–34.0)
MCHC: 31.9 g/dL (ref 30.0–36.0)
MCV: 82.7 fL (ref 80.0–100.0)
Monocytes Absolute: 0.4 10*3/uL (ref 0.1–1.0)
Monocytes Relative: 5 %
Neutro Abs: 4.1 10*3/uL (ref 1.7–7.7)
Neutrophils Relative %: 56 %
Platelets: 332 10*3/uL (ref 150–400)
RBC: 3.94 MIL/uL (ref 3.87–5.11)
RDW: 15.4 % (ref 11.5–15.5)
WBC: 7.5 10*3/uL (ref 4.0–10.5)
nRBC: 0 % (ref 0.0–0.2)

## 2023-08-28 LAB — BASIC METABOLIC PANEL WITH GFR
Anion gap: 11 (ref 5–15)
BUN: 10 mg/dL (ref 6–20)
CO2: 25 mmol/L (ref 22–32)
Calcium: 9.6 mg/dL (ref 8.9–10.3)
Chloride: 100 mmol/L (ref 98–111)
Creatinine, Ser: 0.91 mg/dL (ref 0.44–1.00)
GFR, Estimated: >60 mL/min (ref >60)
Glucose, Bld: 87 mg/dL (ref 70–99)
Potassium: 4.1 mmol/L (ref 3.5–5.1)
Sodium: 136 mmol/L (ref 135–145)

## 2023-08-28 LAB — TROPONIN T, HIGH SENSITIVITY: Troponin T High Sensitivity: <15 ng/L (ref <19)
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
## 2023-08-28 NOTE — Discharge Instructions (Signed)
Contact a health care provider if: Your symptoms do not improve after several days of treatment. Your symptoms get worse. You have difficulty moving the injured area. Get help right away if: You have severe pain. You have numbness in a hand or foot. Your hand or foot turns pale or cold. 

## 2023-09-11 ENCOUNTER — Ambulatory Visit: Admitting: Obstetrics

## 2023-09-11 ENCOUNTER — Encounter: Payer: Self-pay | Admitting: Obstetrics

## 2023-09-11 ENCOUNTER — Other Ambulatory Visit (HOSPITAL_COMMUNITY)
Admission: RE | Admit: 2023-09-11 | Discharge: 2023-09-11 | Disposition: A | Discharge status: Home / Self Care | Source: Ambulatory Visit | Attending: Obstetrics | Admitting: Obstetrics

## 2023-09-11 VITALS — BP 122/85 | HR 96 | Ht 64.0 in | Wt 283.6 lb

## 2023-09-11 DIAGNOSIS — Z9889 Other specified postprocedural states: Secondary | ICD-10-CM | POA: Diagnosis not present

## 2023-09-11 DIAGNOSIS — Z01419 Encounter for gynecological examination (general) (routine) without abnormal findings: Secondary | ICD-10-CM | POA: Diagnosis present

## 2023-09-11 DIAGNOSIS — N898 Other specified noninflammatory disorders of vagina: Secondary | ICD-10-CM | POA: Diagnosis not present

## 2023-09-11 DIAGNOSIS — Z6841 Body Mass Index (BMI) 40.0 and over, adult: Secondary | ICD-10-CM

## 2023-09-11 DIAGNOSIS — Z113 Encounter for screening for infections with a predominantly sexual mode of transmission: Secondary | ICD-10-CM | POA: Diagnosis not present

## 2023-09-11 DIAGNOSIS — Z86711 Personal history of pulmonary embolism: Secondary | ICD-10-CM

## 2023-09-11 DIAGNOSIS — Z1331 Encounter for screening for depression: Secondary | ICD-10-CM | POA: Diagnosis not present

## 2023-09-11 DIAGNOSIS — D251 Intramural leiomyoma of uterus: Secondary | ICD-10-CM

## 2023-09-11 DIAGNOSIS — E66813 Obesity, class 3: Secondary | ICD-10-CM

## 2023-09-11 NOTE — Progress Notes (Signed)
 Pt presents for annual. Pt needs a pap, and would like all std testing. Pt states that she has been having a discharge.

## 2023-09-11 NOTE — Progress Notes (Addendum)
 Subjective:        Felicia Pham is a 43 y.o. female here for a routine exam.  Current complaints: Vaginal discharge.    Personal health questionnaire:  Is patient [name], have a family history of breast and/or ovarian cancer: yes Is there a family history of uterine cancer diagnosed at age < 81, gastrointestinal cancer, urinary tract cancer, family member who is a Personnel officer syndrome-associated carrier: no Is the patient overweight and hypertensive, family history of diabetes, personal history of gestational diabetes, preeclampsia or PCOS: yes Is patient over 59, have PCOS,  family history of premature CHD under age 26, diabetes, smoke, have hypertension or peripheral artery disease:  no At any time, has a partner hit, kicked or otherwise hurt or frightened you?: no Over the past 2 weeks, have you felt down, depressed or hopeless?: no Over the past 2 weeks, have you felt little interest or pleasure in doing things?:no   Gynecologic History Patient's last menstrual period was 08/24/2023 (approximate). Contraception: condoms Last Pap: 2024. Results were: ASCUS with negative High Risk HPV Last mammogram: 2024. Results were: normal  Obstetric History OB History  Gravida Para Term Preterm AB Living  0 0 0 0 0 0  SAB IAB Ectopic Multiple Live Births  0 0 0 0     Past Medical History:  Diagnosis Date   DVT (deep venous thrombosis) (HCC)    PE (pulmonary thromboembolism) (HCC)    Rib fracture    Uterine fibroid     Past Surgical History:  Procedure Laterality Date   MYOMECTOMY  2014   MYOMECTOMY ABDOMINAL APPROACH  10/2021     Current Outpatient Medications:    acetaminophen  (TYLENOL ) 325 MG tablet, Take 2 tablets (650 mg total) by mouth every 6 (six) hours as needed for headache or mild pain., Disp: 60 tablet, Rfl: 0   Cholecalciferol (VITAMIN D3) 50 MCG (2000 UT) CAPS, Take 4,000 Units by mouth daily., Disp: , Rfl:    ipratropium (ATROVENT ) 0.06 % nasal spray,  Place 2 sprays into both nostrils 4 (four) times daily., Disp: 15 mL, Rfl: 0   Multiple Vitamins-Minerals (MULTIVITAMIN WITH MINERALS) tablet, Take 1 tablet by mouth daily., Disp: , Rfl:    spironolactone (ALDACTONE) 25 MG tablet, Take 25 mg by mouth daily as needed., Disp: , Rfl:    ibuprofen  (ADVIL ) 600 MG tablet, Take 1 tablet (600 mg total) by mouth every 6 (six) hours as needed. (Patient not taking: Reported on 09/11/2023), Disp: 30 tablet, Rfl: 0   promethazine -dextromethorphan (PROMETHAZINE -DM) 6.25-15 MG/5ML syrup, Take 5 mLs by mouth 4 (four) times daily as needed for cough. (Patient not taking: Reported on 09/11/2023), Disp: 118 mL, Rfl: 0 Allergies  Allergen Reactions   Taytulla [Norethin Ace-Eth Estrad-Fe]     Pt unable to take any OCP due to pulmonary embolus.    Social History   Tobacco Use   Smoking status: Never   Smokeless tobacco: Never  Substance Use Topics   Alcohol use: No    Family History  Problem Relation Age of Onset   Lupus Mother    Hypertension Mother    Hypertension Father    Diabetes Father    Heart attack Father 36   Diabetes Maternal Grandmother    Diabetes Paternal Grandmother       Review of Systems  Constitutional: negative for fatigue and weight loss Respiratory: negative for cough and wheezing Cardiovascular: negative for chest pain, fatigue and palpitations Gastrointestinal: negative for abdominal pain and  change in bowel habits Musculoskeletal:negative for myalgias Neurological: negative for gait problems and tremors Behavioral/Psych: negative for abusive relationship, depression Endocrine: negative for temperature intolerance    Genitourinary: POSITIVE for vaginal discharge.  negative for abnormal menstrual periods, genital lesions, hot flashes, sexual problems  Integument/breast: negative for breast lump, breast tenderness, nipple discharge and skin lesion(s)    Objective:       BP 122/85   Pulse 96   Ht 5' 4 (1.626 m)   Wt 283  lb 9.6 oz (128.6 kg)   LMP 08/24/2023 (Approximate)   BMI 48.68 kg/m  General:   Alert and no distress  Skin:   no rash or abnormalities  Lungs:   clear to auscultation bilaterally  Heart:   regular rate and rhythm, S1, S2 normal, no murmur, click, rub or gallop  Breasts:   normal without suspicious masses, skin or nipple changes or axillary nodes  Abdomen:  normal findings: no organomegaly, soft, non-tender and no hernia  Pelvis:  External genitalia: normal general appearance Urinary system: urethral meatus normal and bladder without fullness, nontender Vaginal: normal without tenderness, induration or masses Cervix: normal appearance Adnexa: difficult to appreciate due to obesity, but fullness felt in adnexa R > L Uterus: difficult to appreciate due to obesity, but feels enlarged, irregular contour   Lab Review Urine pregnancy test Labs reviewed yes Radiologic studies reviewed yes  I have spent a total of 20 minutes of face-to-face time, excluding clinical staff time, reviewing notes and preparing to see patient, ordering tests and/or medications, and counseling the patient.   Assessment:    1. Encounter for gynecological examination with Papanicolaou smear of cervix (Primary) Rx: - Cytology - PAP( [place])  2. Status post myomectomy  3. Vaginal discharge Rx: - Cervicovaginal ancillary only( [place])  4. Screening for STD (sexually transmitted disease) Rx: - HIV antibody (with reflex) - RPR - Hepatitis C Antibody - Hepatitis B Surface AntiGEN  5. Class 3 severe obesity due to excess calories without serious comorbidity with body mass index (BMI) of 45.0 to 49.9 in adult - weight reduction with the aid of dietary changes, exercise and behavioral modification recommended  6. Fibroids, intramural Rx: - US  PELVIC COMPLETE WITH TRANSVAGINAL; Future  7. History of pulmonary embolism/DVT    Plan:    Education reviewed: calcium supplements, depression  evaluation, low fat, low cholesterol diet, safe sex/STD prevention, self breast exams, and weight bearing exercise. Contraception: condoms. Follow up in: 1 year.    Orders Placed This Encounter  Procedures   US  PELVIC COMPLETE WITH TRANSVAGINAL    [employer]ET[employer] #[id number] EPIC ORDER    Standing Status:   Future    Expiration Date:   09/10/2024    Reason for Exam (SYMPTOM  OR DIAGNOSIS REQUIRED):   UTERINE FIBROIDS    Preferred imaging location?:   [facility]   HIV antibody (with reflex)   RPR   Hepatitis C Antibody   Hepatitis B Surface AntiGEN     Gabrielle Joiner, MD, FACOG Attending Obstetrician & Gynecologist, [facility] for [facility], [facility], [place] 09/11/2023

## 2023-09-12 ENCOUNTER — Encounter (HOSPITAL_COMMUNITY): Payer: Self-pay | Admitting: *Deleted

## 2023-09-12 ENCOUNTER — Ambulatory Visit (HOSPITAL_COMMUNITY)
Admission: EM | Admit: 2023-09-12 | Discharge: 2023-09-12 | Disposition: A | Discharge status: Home / Self Care | Attending: Physician Assistant | Admitting: Physician Assistant

## 2023-09-12 DIAGNOSIS — M25571 Pain in right ankle and joints of right foot: Secondary | ICD-10-CM | POA: Diagnosis not present

## 2023-09-12 LAB — HEPATITIS B SURFACE ANTIGEN: Hepatitis B Surface Ag: NEGATIVE

## 2023-09-12 LAB — HIV ANTIBODY (ROUTINE TESTING W REFLEX): HIV Screen 4th Generation wRfx: NONREACTIVE

## 2023-09-12 LAB — RPR: RPR Ser Ql: NONREACTIVE

## 2023-09-12 LAB — HEPATITIS C ANTIBODY: Hep C Virus Ab: NONREACTIVE

## 2023-09-12 MED ORDER — MELOXICAM 7.5 MG PO TABS: 7.5000 mg | ORAL_TABLET | Freq: Every day | ORAL | 0 refills | Status: AC

## 2023-09-12 NOTE — ED Triage Notes (Signed)
 Pt states that she has right ankle pain and swelling since yesterday afternoon, she denies any injury. She has been using ice and OTC pain cream.

## 2023-09-12 NOTE — Discharge Instructions (Signed)
 Recommend ice and elevation over the next few days.  Recommend gentle stretching Can wear ankle brace for comfort Take antiinflammatory Mobic once daily, can take Tylenol  along with this medication.  If no improvement or symptoms become worse recommend follow up with Primary Care Physician or orthopedics.

## 2023-09-12 NOTE — ED Provider Notes (Signed)
 [facility]    CSN: [id number] Arrival date & time: 09/12/23  1019      History   Chief Complaint Chief Complaint  Patient presents with   Ankle Pain    HPI Felicia Pham is a 43 y.o. female.   Patient presents for right ankle pain and swelling that started yesterday afternoon.  She denies injury or trauma.  She reports she is on her feet a lot, works in [employer] facility, wears boots daily. She denies redness or warmth.  She has been applying ice and OTC pain cream with minimal relief.  Pain is worse when pressing the gas pedal.     Past Medical History:  Diagnosis Date   DVT (deep venous thrombosis) (HCC)    PE (pulmonary thromboembolism) (HCC)    Rib fracture    Uterine fibroid     Patient Active Problem List   Diagnosis Date Noted   Chest pain of uncertain etiology 09/23/2021   History of pulmonary embolism/DVT 09/23/2021   Pulmonary embolism (HCC) 09/05/2020   Obesity, Class III, BMI 40-49.9 (morbid obesity) 09/05/2020   Goiter 03/30/2018   Body mass index 37.0-37.9, adult 03/30/2018   Mild dysplasia of cervix 01/05/2014   Papanicolaou smear of cervix with low grade squamous intraepithelial lesion (LGSIL) 12/12/2013   Other iron deficiency anemias 02/25/2013   Other and unspecified ovarian cyst 11/02/2012   Leiomyoma of uterus, unspecified 08/05/2012    Past Surgical History:  Procedure Laterality Date   MYOMECTOMY  2014   MYOMECTOMY ABDOMINAL APPROACH  10/2021    OB History     Gravida  0   Para  0   Term  0   Preterm  0   AB  0   Living  0      SAB  0   IAB  0   Ectopic  0   Multiple  0   Live Births               Home Medications    Prior to Admission medications   Medication Sig Start Date End Date Taking? Authorizing Provider  acetaminophen  (TYLENOL ) 325 MG tablet Take 2 tablets (650 mg total) by mouth every 6 (six) hours as needed for headache or mild pain. 09/06/20  Yes Claretta Croft, MD   meloxicam (MOBIC) 7.5 MG tablet Take 1 tablet (7.5 mg total) by mouth daily. 09/12/23 10/12/23 Yes Ward, Char Common, PA-C  spironolactone (ALDACTONE) 25 MG tablet Take 25 mg by mouth daily as needed. 03/03/21  Yes [provider]  Cholecalciferol (VITAMIN D3) 50 MCG (2000 UT) CAPS Take 4,000 Units by mouth daily.    [provider]  ipratropium (ATROVENT ) 0.06 % nasal spray Place 2 sprays into both nostrils 4 (four) times daily. 04/18/22   Mortenson, Ashley, MD  Multiple Vitamins-Minerals (MULTIVITAMIN WITH MINERALS) tablet Take 1 tablet by mouth daily.    [provider]  promethazine -dextromethorphan (PROMETHAZINE -DM) 6.25-15 MG/5ML syrup Take 5 mLs by mouth 4 (four) times daily as needed for cough. Patient not taking: Reported on 09/11/2023 04/18/22   Ethlyn Herd, MD    Family History Family History  Problem Relation Age of Onset   Lupus Mother    Hypertension Mother    Hypertension Father    Diabetes Father    Heart attack Father 44   Diabetes Maternal Grandmother    Diabetes Paternal Grandmother     Social History Social History   Tobacco Use   Smoking status:  Never   Smokeless tobacco: Never  Vaping Use   Vaping status: Never Used  Substance Use Topics   Alcohol use: No   Drug use: No     Allergies   Taytulla [norethin ace-eth estrad-fe]   Review of Systems Review of Systems  Constitutional:  Negative for chills and fever.  HENT:  Negative for ear pain and sore throat.   Eyes:  Negative for pain and visual disturbance.  Respiratory:  Negative for cough and shortness of breath.   Cardiovascular:  Negative for chest pain and palpitations.  Gastrointestinal:  Negative for abdominal pain and vomiting.  Genitourinary:  Negative for dysuria and hematuria.  Musculoskeletal:  Positive for arthralgias (right ankle pain). Negative for back pain.  Skin:  Negative for color change and rash.  Neurological:  Negative for seizures and syncope.  All  other systems reviewed and are negative.    Physical Exam Triage Vital Signs ED Triage Vitals  Encounter Vitals Group     BP 09/12/23 1057 (!) 147/83     Systolic BP Percentile --      Diastolic BP Percentile --      Pulse Rate 09/12/23 1057 86     Resp 09/12/23 1057 18     Temp 09/12/23 1057 98.2 F (36.8 C)     Temp Source 09/12/23 1057 Oral     SpO2 09/12/23 1057 98 %     Weight --      Height --      Head Circumference --      Peak Flow --      Pain Score 09/12/23 1056 10     Pain Loc --      Pain Education --      Exclude from Growth Chart --    No data found.  Updated Vital Signs BP (!) 147/83 (BP Location: Right Arm)   Pulse 86   Temp 98.2 F (36.8 C) (Oral)   Resp 18   LMP 08/24/2023 (Approximate)   SpO2 98%   Visual Acuity Right Eye Distance:   Left Eye Distance:   Bilateral Distance:    Right Eye Near:   Left Eye Near:    Bilateral Near:     Physical Exam Vitals and nursing note reviewed.  Constitutional:      General: She is not in acute distress.    Appearance: She is well-developed.  HENT:     Head: Normocephalic and atraumatic.  Eyes:     Conjunctiva/sclera: Conjunctivae normal.  Cardiovascular:     Rate and Rhythm: Normal rate and regular rhythm.     Heart sounds: No murmur heard. Pulmonary:     Effort: Pulmonary effort is normal. No respiratory distress.     Breath sounds: Normal breath sounds.  Abdominal:     Palpations: Abdomen is soft.     Tenderness: There is no abdominal tenderness.  Musculoskeletal:        General: No swelling.     Cervical back: Neck supple.     Comments: No malleolus or bony tenderness, normal ROM, normal strength.  No warmth noted.  Some mild swelling over medial aspect of ankle.   Skin:    General: Skin is warm and dry.     Capillary Refill: Capillary refill takes less than 2 seconds.  Neurological:     Mental Status: She is alert.  Psychiatric:        Mood and Affect: Mood normal.      UC  Treatments /  Results  Labs (all labs ordered are listed, but only abnormal results are displayed) Labs Reviewed - No data to display  EKG   Radiology No results found.  Procedures Procedures (including critical care time)  Medications Ordered in UC Medications - No data to display  Initial Impression / Assessment and Plan / UC Course  I have reviewed the triage vital signs and the nursing notes.  Pertinent labs & imaging results that were available during my care of the patient were reviewed by me and considered in my medical decision making (see chart for details).     Given no injury or trauma, no bony tenderness, no indication for imaging today.  Sx likely related to overuse.  Will give ankle brace and prescribe mobic today.  Supportive care discussed including ice, elevation, stretching.  Work note given.  If no improvement or symptoms become worse recommend follow up with PCP or ortho.  Final Clinical Impressions(s) / UC Diagnoses   Final diagnoses:  Acute right ankle pain     Discharge Instructions      Recommend ice and elevation over the next few days.  Recommend gentle stretching Can wear ankle brace for comfort Take antiinflammatory Mobic once daily, can take Tylenol  along with this medication.  If no improvement or symptoms become worse recommend follow up with Primary Care Physician or orthopedics.   ED Prescriptions     Medication Sig Dispense Auth. Provider   meloxicam (MOBIC) 7.5 MG tablet Take 1 tablet (7.5 mg total) by mouth daily. 30 tablet Ward, Keziah Avis Z, PA-C      PDMP not reviewed this encounter.   Ward, Char Common, PA-C 09/12/23 1121

## 2023-09-14 LAB — CERVICOVAGINAL ANCILLARY ONLY
Bacterial Vaginitis (gardnerella): POSITIVE — AB
Candida Glabrata: NEGATIVE
Candida Vaginitis: NEGATIVE
Chlamydia: NEGATIVE
Comment: NEGATIVE
Comment: NEGATIVE
Comment: NEGATIVE
Comment: NEGATIVE
Comment: NEGATIVE
Comment: NORMAL
Neisseria Gonorrhea: NEGATIVE
Trichomonas: NEGATIVE

## 2023-09-15 ENCOUNTER — Ambulatory Visit: Payer: Self-pay | Admitting: Family Medicine

## 2023-09-15 MED ORDER — METRONIDAZOLE 500 MG PO TABS
500.0000 mg | ORAL_TABLET | Freq: Two times a day (BID) | ORAL | 0 refills | Status: AC
Start: 2023-09-15 — End: 2023-09-22

## 2023-09-18 ENCOUNTER — Ambulatory Visit
Admission: RE | Admit: 2023-09-18 | Discharge: 2023-09-18 | Disposition: A | Discharge status: Home / Self Care | Source: Ambulatory Visit | Attending: Obstetrics | Admitting: Obstetrics

## 2023-09-18 DIAGNOSIS — D251 Intramural leiomyoma of uterus: Secondary | ICD-10-CM

## 2023-09-18 LAB — CYTOLOGY - PAP
Adequacy: ABSENT
Comment: NEGATIVE
Comment: NEGATIVE
Comment: NEGATIVE
HPV 16: NEGATIVE
HPV 18 / 45: NEGATIVE
High risk HPV: POSITIVE — AB

## 2023-09-24 ENCOUNTER — Other Ambulatory Visit: Payer: Self-pay | Admitting: Obstetrics

## 2023-09-24 DIAGNOSIS — Z9889 Other specified postprocedural states: Secondary | ICD-10-CM

## 2023-09-24 DIAGNOSIS — Z113 Encounter for screening for infections with a predominantly sexual mode of transmission: Secondary | ICD-10-CM

## 2023-09-24 DIAGNOSIS — Z6841 Body Mass Index (BMI) 40.0 and over, adult: Secondary | ICD-10-CM

## 2023-09-24 DIAGNOSIS — D251 Intramural leiomyoma of uterus: Secondary | ICD-10-CM

## 2023-09-24 DIAGNOSIS — N898 Other specified noninflammatory disorders of vagina: Secondary | ICD-10-CM

## 2023-09-24 DIAGNOSIS — Z86711 Personal history of pulmonary embolism: Secondary | ICD-10-CM

## 2023-09-24 DIAGNOSIS — Z01419 Encounter for gynecological examination (general) (routine) without abnormal findings: Secondary | ICD-10-CM

## 2023-09-25 ENCOUNTER — Ambulatory Visit: Payer: Self-pay | Admitting: Obstetrics

## 2023-09-30 ENCOUNTER — Telehealth: Admitting: Obstetrics

## 2023-10-08 ENCOUNTER — Encounter: Payer: Self-pay | Admitting: Obstetrics and Gynecology

## 2023-10-08 ENCOUNTER — Telehealth: Admitting: Obstetrics and Gynecology

## 2023-10-08 DIAGNOSIS — N9489 Other specified conditions associated with female genital organs and menstrual cycle: Secondary | ICD-10-CM

## 2023-10-08 DIAGNOSIS — D219 Benign neoplasm of connective and other soft tissue, unspecified: Secondary | ICD-10-CM

## 2023-10-08 NOTE — Progress Notes (Signed)
 TELEHEALTH GYNECOLOGY VISIT ENCOUNTER NOTE  Provider location: [facility]   Patient location: Work  I connected with Felicia Pham on 10/08/23 at  2:50 PM EDT by telephone and verified that I am speaking with the correct person using two identifiers. Patient was unable to do MyChart audiovisual encounter due to technical difficulties, she tried several times.    History:  Felicia Pham is a 43 y.o. G0P0000 with LMP 08/24/23 presenting for ultrasound follow up  Feeling pain in the lower abdominal area so requested pelvic ultrasound to check on her fibroids at her annual exam in May.  Having some intermittent pain, comes and goes. Does not need to take medications because it's pretty tolerable. Only one time she did have a different pain that required her to take tylenol . Periods are heavy but manageable and overall getting better.   Has not completed childbearing  Hx VTE - PE/DVT diagnosed on MRI 2022 when getting work up for fibroids at [facility]  Pelvic US  reviewed - 14.8 x 8.3 x 12.6cm uterus with multiple fibroids - 5cm L uterine fundus, 5.2cm R posterior, 4.2 R posterior fundal. Fibroids w/ SM component. 9.8cm R oval adnexal mass and 5.5cm solid L adnexal mass both suspected to be pedunculated fibroids. Consider pelvic MRI for correlation    Past Medical History:  Diagnosis Date   DVT (deep venous thrombosis) (HCC)    PE (pulmonary thromboembolism) (HCC)    Rib fracture    Uterine fibroid    Past Surgical History:  Procedure Laterality Date   MYOMECTOMY  2014   MYOMECTOMY ABDOMINAL APPROACH  10/2021   The following portions of the patient's history were reviewed and updated as appropriate: allergies, current medications, past family history, past medical history, past social history, past surgical history and problem list.   Review of Systems:  Pertinent items noted in HPI and remainder of comprehensive ROS otherwise negative.  Physical Exam:    General:  Alert, oriented and cooperative.   Mental Status: Normal mood and affect perceived. Normal judgment and thought content.  Physical exam deferred due to nature of the encounter  Labs and Imaging No results found for this or any previous visit (from the past 2 weeks). US  PELVIS (TRANSABDOMINAL ONLY) Result Date: 09/24/2023 CLINICAL DATA:  Uterine fibroids EXAM: TRANSABDOMINAL ULTRASOUND OF PELVIS TECHNIQUE: Transabdominal ultrasound examination of the pelvis was performed including evaluation of the uterus, ovaries, adnexal regions, and pelvic cul-de-sac. COMPARISON:  Ultrasound 05/07/2020 FINDINGS: Uterus Measurements: 14.8 x 8.3 x 12.6 cm = volume: 308.8 mL. Multiple uterine fibroids. Largest fibroids are seen at the left uterine fundus measuring 5 x 4.4 x 3.7 cm, at the right posterior uterine corpus, measuring 4.6 x 5.2 x 4.5 cm, and at the right posterior uterine fundus measuring 4.2 x 3.3 x 3.7 cm. Fibroids extend submucosal. Endometrium Thickness: 9.8 mm.  No focal abnormality visualized. Right ovary Right adnexal oval mass measuring 9.8 x 5.1 x 4.1 cm Left ovary Solid left adnexal mass measuring 5.5 x 4.9 x 3.3 cm. Other findings:  No abnormal free fluid. IMPRESSION: 1. Enlarged fibroid uterus with multiple fibroids many of which extending submucosal. 2. Bilateral solid adnexal masses measuring up to 9.8 cm on the right and 5.5 cm on the left, could represent pedunculated fibroids versus ovarian lesions. Correlation with pelvic MRI could be considered for further evaluation. Electronically Signed   By: Luke Bun M.D.   On: 09/24/2023 15:18      Assessment  and Plan:     1. Adnexal mass (Primary) 2. Fibroids Discussed most likely these are pedunculated fibroids that can be managed expectantly. If adnexal masses, would require surgical removal. Will obtain pelvic MRI to confirm and further understand fibroid anatomy Desires future fertility - if pain/pressure is an issue, could  consider repeat myomectomy but patient has hx VTE so would be high risk for recurrence.  Reviewed that in general her history of VTE limits further management options. - MR PELVIS W WO CONTRAST; Future  I discussed the assessment and treatment plan with the patient. The patient was provided an opportunity to ask questions and all were answered. The patient agreed with the plan and demonstrated an understanding of the instructions.   The patient was advised to call back or seek an in-person evaluation/go to the ED if the symptoms worsen or if the condition fails to improve as anticipated.  I provided 20 minutes of non-face-to-face time during this encounter.   Kieth JAYSON Carolin, MD [facility] [employer], [facility]

## 2024-03-14 ENCOUNTER — Emergency Department (HOSPITAL_BASED_OUTPATIENT_CLINIC_OR_DEPARTMENT_OTHER): Admitting: Radiology

## 2024-03-14 ENCOUNTER — Emergency Department (HOSPITAL_BASED_OUTPATIENT_CLINIC_OR_DEPARTMENT_OTHER)

## 2024-03-14 ENCOUNTER — Other Ambulatory Visit: Payer: Self-pay

## 2024-03-14 ENCOUNTER — Emergency Department (HOSPITAL_BASED_OUTPATIENT_CLINIC_OR_DEPARTMENT_OTHER)
Admission: EM | Admit: 2024-03-14 | Discharge: 2024-03-14 | Disposition: A | Discharge status: Home / Self Care | Attending: Emergency Medicine | Admitting: Emergency Medicine

## 2024-03-14 DIAGNOSIS — R072 Precordial pain: Secondary | ICD-10-CM | POA: Diagnosis not present

## 2024-03-14 DIAGNOSIS — R0789 Other chest pain: Secondary | ICD-10-CM | POA: Diagnosis present

## 2024-03-14 DIAGNOSIS — J029 Acute pharyngitis, unspecified: Secondary | ICD-10-CM | POA: Diagnosis not present

## 2024-03-14 DIAGNOSIS — R0981 Nasal congestion: Secondary | ICD-10-CM | POA: Insufficient documentation

## 2024-03-14 LAB — BASIC METABOLIC PANEL WITH GFR
Anion gap: 11 (ref 5–15)
BUN: 18 mg/dL (ref 6–20)
CO2: 25 mmol/L (ref 22–32)
Calcium: 10.2 mg/dL (ref 8.9–10.3)
Chloride: 100 mmol/L (ref 98–111)
Creatinine, Ser: 0.79 mg/dL (ref 0.44–1.00)
GFR, Estimated: >60 mL/min (ref >60)
Glucose, Bld: 95 mg/dL (ref 70–99)
Potassium: 3.8 mmol/L (ref 3.5–5.1)
Sodium: 136 mmol/L (ref 135–145)

## 2024-03-14 LAB — CBC
HCT: 34.4 % — ABNORMAL LOW (ref 36.0–46.0)
Hemoglobin: 11 g/dL — ABNORMAL LOW (ref 12.0–15.0)
MCH: 25.9 pg — ABNORMAL LOW (ref 26.0–34.0)
MCHC: 32 g/dL (ref 30.0–36.0)
MCV: 81.1 fL (ref 80.0–100.0)
Platelets: 361 K/uL (ref 150–400)
RBC: 4.24 MIL/uL (ref 3.87–5.11)
RDW: 16 % — ABNORMAL HIGH (ref 11.5–15.5)
WBC: 7.3 K/uL (ref 4.0–10.5)
nRBC: 0 % (ref 0.0–0.2)

## 2024-03-14 LAB — RESP PANEL BY RT-PCR (RSV, FLU A&B, COVID)  RVPGX2
Influenza A by PCR: NEGATIVE
Influenza B by PCR: NEGATIVE
Resp Syncytial Virus by PCR: NEGATIVE
SARS Coronavirus 2 by RT PCR: NEGATIVE

## 2024-03-14 LAB — TROPONIN T, HIGH SENSITIVITY: Troponin T High Sensitivity: <15 ng/L (ref 0–19)

## 2024-03-14 LAB — GROUP A STREP BY PCR: Group A Strep by PCR: NOT DETECTED

## 2024-03-14 LAB — PREGNANCY, URINE: Preg Test, Ur: NEGATIVE

## 2024-03-14 MED ORDER — KETOROLAC TROMETHAMINE 15 MG/ML IJ SOLN
15.0000 mg | Freq: Once | INTRAMUSCULAR | Status: AC
  Administered 2024-03-14: 15 mg via INTRAVENOUS
  Filled 2024-03-14: qty 1

## 2024-03-14 MED ORDER — IOHEXOL 350 MG/ML SOLN
75.0000 mL | Freq: Once | INTRAVENOUS | Status: AC | PRN
  Administered 2024-03-14: 75 mL via INTRAVENOUS

## 2024-03-14 NOTE — Discharge Instructions (Signed)
 It is a pleasure taking care of you here in the emergency department today  Your workup today was reassuring.  Make sure to follow-up outpatient with your primary care provider or cardiology  Return for new or worsening symptoms

## 2024-03-14 NOTE — ED Provider Notes (Signed)
 [place] [facility] AT [facility] Provider Note   CSN: [id number] Arrival date & time: 03/14/24  1953     Patient presents with: Chest Pain   Felicia Pham is a 43 y.o. female here for evaluation of chest pain.  Started yesterday.  Noted pain that started in her left scapula, today developed some intermittent pain to her left anterior chest.  States she also has a sore throat and some congestion.  No cough.  Pain nonexertional, nonpleuritic in nature.  No recent sick contacts.  Is typically able to complete her ADLs without any difficulty.  No associated diaphoresis, shortness of breath, numbness or weakness.  She has never had anything like this previously.  No new pain or swelling to legs.  History of PE, thought likely due to birth control.  Not currently on any anticoagulants   HPI     Prior to Admission medications   Medication Sig Start Date End Date Taking? Authorizing Provider  acetaminophen  (TYLENOL ) 325 MG tablet Take 2 tablets (650 mg total) by mouth every 6 (six) hours as needed for headache or mild pain. 09/06/20   Milissa Tod PARAS, MD  Cholecalciferol (VITAMIN D3) 50 MCG (2000 UT) CAPS Take 4,000 Units by mouth daily.    [provider]  ipratropium (ATROVENT ) 0.06 % nasal spray Place 2 sprays into both nostrils 4 (four) times daily. 04/18/22   Mortenson, Ashley, MD  Multiple Vitamins-Minerals (MULTIVITAMIN WITH MINERALS) tablet Take 1 tablet by mouth daily.    [provider]  promethazine -dextromethorphan (PROMETHAZINE -DM) 6.25-15 MG/5ML syrup Take 5 mLs by mouth 4 (four) times daily as needed for cough. Patient not taking: Reported on 09/11/2023 04/18/22   Mortenson, Ashley, MD  spironolactone (ALDACTONE) 25 MG tablet Take 25 mg by mouth daily as needed. 03/03/21   [provider]    Allergies: Trygve sic ace-eth estrad-fe]    Review of Systems  Constitutional: Negative.   HENT:  Positive for congestion and sore  throat. Negative for trouble swallowing and voice change.   Respiratory: Negative.    Cardiovascular:  Positive for chest pain. Negative for palpitations and leg swelling.  Gastrointestinal: Negative.   Genitourinary: Negative.   Musculoskeletal: Negative.   Skin: Negative.   Neurological: Negative.   All other systems reviewed and are negative.   Updated Vital Signs BP 114/61   Pulse 89   Temp (!) 97.5 F (36.4 C) (Oral)   Resp 18   Ht 5' 4 (1.626 m)   Wt 127 kg   LMP 03/05/2024 (Approximate)   SpO2 100%   BMI 48.06 kg/m   Physical Exam Vitals and nursing note reviewed.  Constitutional:      General: She is not in acute distress.    Appearance: She is well-developed. She is not ill-appearing, toxic-appearing or diaphoretic.  HENT:     Head: Atraumatic.  Eyes:     Pupils: Pupils are equal, round, and reactive to light.  Cardiovascular:     Rate and Rhythm: Normal rate.     Pulses:          Radial pulses are 2+ on the right side and 2+ on the left side.       Posterior tibial pulses are 2+ on the right side and 2+ on the left side.     Heart sounds: Normal heart sounds.  Pulmonary:     Effort: Pulmonary effort is normal. No respiratory distress.     Breath sounds: Normal breath sounds.  Chest:  Chest wall: No mass, deformity, tenderness or edema.  Abdominal:     General: Bowel sounds are normal. There is no distension or abdominal bruit.     Palpations: Abdomen is soft.     Tenderness: There is no abdominal tenderness. There is no guarding or rebound.  Musculoskeletal:        General: Normal range of motion.     Cervical back: Normal range of motion.     Right lower leg: No tenderness. No edema.     Left lower leg: No tenderness. No edema.  Skin:    General: Skin is warm and dry.     Capillary Refill: Capillary refill takes less than 2 seconds.  Neurological:     General: No focal deficit present.     Mental Status: She is alert.  Psychiatric:         Mood and Affect: Mood normal.     (all labs ordered are listed, but only abnormal results are displayed) Labs Reviewed  CBC - Abnormal; Notable for the following components:      Result Value   Hemoglobin 11.0 (*)    HCT 34.4 (*)    MCH 25.9 (*)    RDW 16.0 (*)    All other components within normal limits  RESP PANEL BY RT-PCR (RSV, FLU A&B, COVID)  [id number]  GROUP A STREP BY PCR  BASIC METABOLIC PANEL WITH GFR  PREGNANCY, URINE  TROPONIN T, HIGH SENSITIVITY    EKG: EKG Interpretation Date/Time:  Monday March 14 2024 20:02:34 EST Ventricular Rate:  97 PR Interval:  120 QRS Duration:  74 QT Interval:  320 QTC Calculation: 406 R Axis:   29  Text Interpretation: Normal sinus rhythm Normal ECG When compared with ECG of 28-Aug-2023 21:51, PREVIOUS ECG IS PRESENT Confirmed by Cottie Cough [phone]) on 03/14/2024 8:15:59 PM  Radiology: CT Angio Chest PE W and/or Wo Contrast Result Date: 03/14/2024 EXAM: CTA CHEST 03/14/2024 10:01:23 PM TECHNIQUE: CTA of the chest was performed with the administration of 75 mL of iohexol  (OMNIPAQUE ) 350 MG/ML injection. Multiplanar reformatted images are provided for review. MIP images are provided for review. Automated exposure control, iterative reconstruction, and/or weight based adjustment of the mA/kV was utilized to reduce the radiation dose to as low as reasonably achievable. COMPARISON: Comparison with same day x-ray and CT on 04/27/2022. CLINICAL HISTORY: Pulmonary embolism (PE) suspected, high prob. FINDINGS: PULMONARY ARTERIES: Pulmonary arteries are adequately opacified for evaluation. No acute pulmonary embolus. Main pulmonary artery is normal in caliber. MEDIASTINUM: The heart and pericardium demonstrate no acute abnormality. There is no acute abnormality of the thoracic aorta. LYMPH NODES: No mediastinal, hilar or axillary lymphadenopathy. LUNGS AND PLEURA: The lungs are without acute process. No focal consolidation or pulmonary edema. No  evidence of pleural effusion or pneumothorax. UPPER ABDOMEN: Limited images of the upper abdomen are unremarkable. SOFT TISSUES AND BONES: No acute bone or soft tissue abnormality. IMPRESSION: 1. No pulmonary embolism. Electronically signed by: Norman Gatlin MD 03/14/2024 10:14 PM EST RP Workstation: [id number]   DG Chest 2 View Result Date: 03/14/2024 EXAM: 2 VIEW(S) XRAY OF THE CHEST 03/14/2024 08:10:00 PM COMPARISON: 02/25/2023 CLINICAL HISTORY: chest pain FINDINGS: LUNGS AND PLEURA: No focal pulmonary opacity. No pleural effusion. No pneumothorax. HEART AND MEDIASTINUM: No acute abnormality of the cardiac and mediastinal silhouettes. BONES AND SOFT TISSUES: No acute osseous abnormality. IMPRESSION: 1. No acute cardiopulmonary process. Electronically signed by: Franky Stanford MD 03/14/2024 08:54 PM EST RP Workstation: [id number]  Procedures   Medications Ordered in the ED  ketorolac  (TORADOL ) 15 MG/ML injection 15 mg (15 mg Intravenous Given 03/14/24 2131)  iohexol  (OMNIPAQUE ) 350 MG/ML injection 75 mL (75 mLs Intravenous Contrast Given 03/14/24 6662)   43 year old history of PE not anticoagulated here for evaluation of chest and left scapular pain, started yesterday.  Nonexertional, nonpleuritic in nature.  Associated congestion and sore throat.  Afebrile, nonseptic, non-ill-appearing.  Does not appear grossly fluid overloaded.  Nontender calves bilaterally.  Thought was prior PE likely due to OCPs.  She denies any sick contacts.  Will plan on labs, imaging, reassess  Labs and imaging personally viewed and interpreted:  CBC without leukocytosis Metabolic panel without significant abnormality Troponin less than 15 COVID, flu, RSV negative Pregnancy neg Strep neg Chest x-ray negative for acute infiltrates, cardiomegaly, pulm edema, pneumothorax EKG without ischemic changes  Patient reassessed.  Discussed labs and imaging.  Symptoms started yesterday.  Reassuring workup thus far.  Low  suspicion for acute ACS, unstable angina, PE, dissection, endocarditis, pericarditis, bacterial infectious process, [place].  Was admitted for something similar a few years ago with reassuring workup however did not follow-up with cardiology outpatient.  She is on psych she is able to complete her ADLs without any difficulty at home or any exertional chest pain or dyspnea on exertion  Heart score 1                                  Medical Decision Making Amount and/or Complexity of Data Reviewed External Data Reviewed: labs, radiology, ECG and notes. Labs: ordered. Decision-making details documented in ED Course. Radiology: ordered and independent interpretation performed. Decision-making details documented in ED Course. ECG/medicine tests: ordered and independent interpretation performed. Decision-making details documented in ED Course.  Risk OTC drugs. Prescription drug management. Decision regarding hospitalization. Diagnosis or treatment significantly limited by social determinants of health.       Final diagnoses:  Precordial pain    ED Discharge Orders     None          Tane Biegler A, PA-C 03/14/24 2320    Cottie Donnice PARAS, MD 03/17/24 1259

## 2024-03-14 NOTE — ED Triage Notes (Signed)
 Pt POV reporting chest, back and neck pain, also reporting congestion, denies SOB, hx PE.

## 2024-05-13 ENCOUNTER — Ambulatory Visit
Admission: EM | Admit: 2024-05-13 | Discharge: 2024-05-13 | Disposition: A | Discharge status: Home / Self Care | Attending: Family Medicine | Admitting: Family Medicine

## 2024-05-13 DIAGNOSIS — J018 Other acute sinusitis: Secondary | ICD-10-CM

## 2024-05-13 MED ORDER — PSEUDOEPHEDRINE HCL 30 MG PO TABS: 30.0000 mg | ORAL_TABLET | Freq: Three times a day (TID) | ORAL | 0 refills | Status: AC | PRN

## 2024-05-13 MED ORDER — CETIRIZINE HCL 10 MG PO TABS: 10.0000 mg | ORAL_TABLET | Freq: Every day | ORAL | 0 refills | Status: AC

## 2024-05-13 MED ORDER — IBUPROFEN 600 MG PO TABS: 600.0000 mg | ORAL_TABLET | Freq: Four times a day (QID) | ORAL | 0 refills | Status: AC | PRN

## 2024-05-13 MED ORDER — AMOXICILLIN 875 MG PO TABS: 875.0000 mg | ORAL_TABLET | Freq: Two times a day (BID) | ORAL | 0 refills | Status: AC

## 2024-05-13 MED ORDER — PROMETHAZINE-DM 6.25-15 MG/5ML PO SYRP: 5.0000 mL | ORAL_SOLUTION | Freq: Three times a day (TID) | ORAL | 0 refills | Status: AC | PRN

## 2024-05-13 NOTE — ED Provider Notes (Signed)
 " [facility]  Note:  This document was prepared using Conservation officer, historic buildings and may include unintentional dictation errors.  MRN: [id number] DOB: 10-02-1980  Subjective:   Felicia Pham is a 44 y.o. female presenting for 1 week history of sinus congestion, throat pain, productive cough, throat pain, intermittent right ear pain. Denies fever, chest pain, shob, wheezing. No asthma. No smoking of any kind including cigarettes, cigars, vaping, marijuana use.    Current Outpatient Medications  Medication Instructions   acetaminophen  (TYLENOL ) 650 mg, Oral, Every 6 hours PRN   cetirizine  (ZYRTEC ) 10 mg, Daily   dextromethorphan-guaiFENesin (MUCINEX DM) 30-600 MG 12hr tablet 1 tablet, 2 times daily   ipratropium (ATROVENT ) 0.06 % nasal spray 2 sprays, Each Nare, 4 times daily   Multiple Vitamins-Minerals (MULTIVITAMIN WITH MINERALS) tablet 1 tablet, Daily   promethazine -dextromethorphan (PROMETHAZINE -DM) 6.25-15 MG/5ML syrup 5 mLs, Oral, 4 times daily PRN   Pseudoephedrine -APAP-DM (DAYQUIL PO) Take by mouth.   spironolactone (ALDACTONE) 25 mg, Daily PRN   vitamin D3 4,000 Units, Daily    Allergies[1]  Past Medical History:  Diagnosis Date   DVT (deep venous thrombosis) (HCC)    PE (pulmonary thromboembolism) (HCC)    Rib fracture    Uterine fibroid      Past Surgical History:  Procedure Laterality Date   MYOMECTOMY  2014   MYOMECTOMY ABDOMINAL APPROACH  10/2021    Family History  Problem Relation Age of Onset   Lupus Mother    Hypertension Mother    Hypertension Father    Diabetes Father    Heart attack Father 9   Diabetes Maternal Grandmother    Diabetes Paternal Grandmother     Social History   Occupational History   Not on file  Tobacco Use   Smoking status: Never   Smokeless tobacco: Never  Vaping Use   Vaping status: Never Used  Substance and Sexual Activity   Alcohol use: No   Drug use: No   Sexual activity: Yes     Partners: Male    Birth control/protection: None, Condom    Comment: pt has had Lupron- 02/2021     ROS   Objective:   Vitals: BP 127/69 (BP Location: Left Arm)   Pulse 84   Temp 98.3 F (36.8 C) (Oral)   Resp 18   LMP 04/23/2024 (Approximate)   SpO2 98%   Physical Exam Constitutional:      General: She is not in acute distress.    Appearance: Normal appearance. She is well-developed and normal weight. She is not ill-appearing, toxic-appearing or diaphoretic.  HENT:     Head: Normocephalic and atraumatic.     Right Ear: Tympanic membrane, ear canal and external ear normal. No drainage or tenderness. No middle ear effusion. There is no impacted cerumen. Tympanic membrane is not erythematous or bulging.     Left Ear: Tympanic membrane, ear canal and external ear normal. No drainage or tenderness.  No middle ear effusion. There is no impacted cerumen. Tympanic membrane is not erythematous or bulging.     Nose: Nose normal. No congestion or rhinorrhea.     Mouth/Throat:     Mouth: Mucous membranes are moist. No oral lesions.     Pharynx: No pharyngeal swelling, oropharyngeal exudate, posterior oropharyngeal erythema or uvula swelling.     Tonsils: No tonsillar exudate or tonsillar abscesses. 0 on the right. 0 on the left.  Eyes:     General: No scleral icterus.  Right eye: No discharge.        Left eye: No discharge.     Extraocular Movements: Extraocular movements intact.     Right eye: Normal extraocular motion.     Left eye: Normal extraocular motion.     Conjunctiva/sclera: Conjunctivae normal.  Cardiovascular:     Rate and Rhythm: Normal rate and regular rhythm.     Heart sounds: Normal heart sounds. No murmur heard.    No friction rub. No gallop.  Pulmonary:     Effort: Pulmonary effort is normal. No respiratory distress.     Breath sounds: No stridor. No wheezing, rhonchi or rales.  Chest:     Chest wall: No tenderness.  Musculoskeletal:     Cervical back:  Normal range of motion and neck supple.  Lymphadenopathy:     Cervical: No cervical adenopathy.  Skin:    General: Skin is warm and dry.  Neurological:     General: No focal deficit present.     Mental Status: She is alert and oriented to person, place, and time.  Psychiatric:        Mood and Affect: Mood normal.        Behavior: Behavior normal.     Assessment and Plan :   PDMP not reviewed this encounter.  1. Acute non-recurrent sinusitis of other sinus    Deferred imaging given clear pulmonary exam.  Will start empiric treatment for sinusitis with amoxicillin .  Recommended supportive care otherwise. Counseled patient on potential for adverse effects with medications prescribed/recommended today, ER and return-to-clinic precautions discussed, patient verbalized understanding.     [1]  Allergies Allergen Reactions   Taytulla [Norethin Ace-Eth Estrad-Fe]     Pt unable to take any OCP due to pulmonary embolus.     Christopher Savannah, [place] 05/13/24 1911  "

## 2024-05-13 NOTE — ED Triage Notes (Signed)
 Pt reports cough, sore throat and nasal congestion x 1 week. Mucinex, Zyrtec , Dayquil gives some relief.

## 2024-05-13 NOTE — Discharge Instructions (Signed)
We will manage this as a sinus infection with amoxicillin. For sore throat or cough try using a honey-based tea. Use 3 teaspoons of honey with juice squeezed from half lemon. Place shaved pieces of ginger into 1/2-1 cup of water and warm over stove top. Then mix the ingredients and repeat every 4 hours as needed. Please take ibuprofen 600mg every 6 hours with food alternating with OR taken together with Tylenol 500mg-650mg every 6 hours for throat pain, fevers, aches and pains. Hydrate very well with at least 2 liters of water. Eat light meals such as soups (chicken and noodles, vegetable, chicken and wild rice).  Do not eat foods that you are allergic to.  Taking an antihistamine like Zyrtec can help against postnasal drainage, sinus congestion which can cause sinus pain, sinus headaches, throat pain, painful swallowing, coughing.  You can take this together with pseudoephedrine (Sudafed) at a dose of 30 mg 3 times a day or twice daily as needed for the same kind of nasal drip, congestion.  Use cough medication as needed.
